# Patient Record
Sex: Female | Born: 1940 | Race: White | Hispanic: No | Smoking: Former smoker
Health system: Southern US, Community
[De-identification: ages and names within clinical notes are randomized; demographics above are authoritative.]

## PROBLEM LIST (undated history)

## (undated) DIAGNOSIS — M1990 Unspecified osteoarthritis, unspecified site: Secondary | ICD-10-CM

## (undated) DIAGNOSIS — F419 Anxiety disorder, unspecified: Secondary | ICD-10-CM

## (undated) DIAGNOSIS — Z950 Presence of cardiac pacemaker: Secondary | ICD-10-CM

## (undated) DIAGNOSIS — K219 Gastro-esophageal reflux disease without esophagitis: Secondary | ICD-10-CM

## (undated) DIAGNOSIS — E0500 Thyrotoxicosis with diffuse goiter without thyrotoxic crisis or storm: Secondary | ICD-10-CM

## (undated) DIAGNOSIS — I2510 Atherosclerotic heart disease of native coronary artery without angina pectoris: Secondary | ICD-10-CM

## (undated) DIAGNOSIS — I447 Left bundle-branch block, unspecified: Secondary | ICD-10-CM

## (undated) DIAGNOSIS — I10 Essential (primary) hypertension: Secondary | ICD-10-CM

## (undated) DIAGNOSIS — I442 Atrioventricular block, complete: Secondary | ICD-10-CM

## (undated) DIAGNOSIS — E785 Hyperlipidemia, unspecified: Secondary | ICD-10-CM

## (undated) HISTORY — DX: Essential (primary) hypertension: I10

## (undated) HISTORY — PX: OTHER SURGICAL HISTORY: SHX169

## (undated) HISTORY — DX: Hyperlipidemia, unspecified: E78.5

## (undated) HISTORY — DX: Thyrotoxicosis with diffuse goiter without thyrotoxic crisis or storm: E05.00

## (undated) HISTORY — PX: CARDIAC SURGERY: SHX584

## (undated) HISTORY — DX: Atherosclerotic heart disease of native coronary artery without angina pectoris: I25.10

## (undated) HISTORY — DX: Unspecified osteoarthritis, unspecified site: M19.90

## (undated) HISTORY — DX: Anxiety disorder, unspecified: F41.9

## (undated) HISTORY — DX: Gastro-esophageal reflux disease without esophagitis: K21.9

---

## 2013-03-03 DIAGNOSIS — J984 Other disorders of lung: Secondary | ICD-10-CM | POA: Diagnosis not present

## 2013-03-03 DIAGNOSIS — R9431 Abnormal electrocardiogram [ECG] [EKG]: Secondary | ICD-10-CM | POA: Diagnosis not present

## 2013-03-03 DIAGNOSIS — I214 Non-ST elevation (NSTEMI) myocardial infarction: Secondary | ICD-10-CM | POA: Diagnosis not present

## 2013-03-04 DIAGNOSIS — E7800 Pure hypercholesterolemia, unspecified: Secondary | ICD-10-CM | POA: Diagnosis not present

## 2013-03-04 DIAGNOSIS — R7989 Other specified abnormal findings of blood chemistry: Secondary | ICD-10-CM | POA: Diagnosis not present

## 2013-03-04 DIAGNOSIS — I509 Heart failure, unspecified: Secondary | ICD-10-CM | POA: Diagnosis not present

## 2013-03-04 DIAGNOSIS — I5032 Chronic diastolic (congestive) heart failure: Secondary | ICD-10-CM | POA: Diagnosis not present

## 2013-03-04 DIAGNOSIS — I214 Non-ST elevation (NSTEMI) myocardial infarction: Secondary | ICD-10-CM | POA: Diagnosis not present

## 2013-03-04 DIAGNOSIS — M810 Age-related osteoporosis without current pathological fracture: Secondary | ICD-10-CM | POA: Diagnosis present

## 2013-03-04 DIAGNOSIS — D72829 Elevated white blood cell count, unspecified: Secondary | ICD-10-CM | POA: Diagnosis present

## 2013-03-04 DIAGNOSIS — E785 Hyperlipidemia, unspecified: Secondary | ICD-10-CM | POA: Diagnosis present

## 2013-03-04 DIAGNOSIS — I10 Essential (primary) hypertension: Secondary | ICD-10-CM | POA: Diagnosis not present

## 2013-03-04 DIAGNOSIS — Z8262 Family history of osteoporosis: Secondary | ICD-10-CM | POA: Diagnosis not present

## 2013-03-04 DIAGNOSIS — Z8249 Family history of ischemic heart disease and other diseases of the circulatory system: Secondary | ICD-10-CM | POA: Diagnosis not present

## 2013-03-04 DIAGNOSIS — I209 Angina pectoris, unspecified: Secondary | ICD-10-CM | POA: Diagnosis not present

## 2013-03-04 DIAGNOSIS — Z880 Allergy status to penicillin: Secondary | ICD-10-CM | POA: Diagnosis not present

## 2013-03-04 DIAGNOSIS — R9431 Abnormal electrocardiogram [ECG] [EKG]: Secondary | ICD-10-CM | POA: Diagnosis not present

## 2013-03-04 DIAGNOSIS — J984 Other disorders of lung: Secondary | ICD-10-CM | POA: Diagnosis not present

## 2013-03-04 DIAGNOSIS — I447 Left bundle-branch block, unspecified: Secondary | ICD-10-CM | POA: Diagnosis not present

## 2013-03-04 DIAGNOSIS — Z83511 Family history of glaucoma: Secondary | ICD-10-CM | POA: Diagnosis not present

## 2013-03-04 DIAGNOSIS — K219 Gastro-esophageal reflux disease without esophagitis: Secondary | ICD-10-CM | POA: Diagnosis present

## 2013-03-04 DIAGNOSIS — R079 Chest pain, unspecified: Secondary | ICD-10-CM | POA: Diagnosis not present

## 2013-03-04 DIAGNOSIS — I059 Rheumatic mitral valve disease, unspecified: Secondary | ICD-10-CM | POA: Diagnosis present

## 2013-03-04 DIAGNOSIS — F411 Generalized anxiety disorder: Secondary | ICD-10-CM | POA: Diagnosis not present

## 2013-03-04 DIAGNOSIS — Z7982 Long term (current) use of aspirin: Secondary | ICD-10-CM | POA: Diagnosis not present

## 2013-03-04 DIAGNOSIS — R000 Tachycardia, unspecified: Secondary | ICD-10-CM | POA: Diagnosis not present

## 2013-03-04 DIAGNOSIS — I498 Other specified cardiac arrhythmias: Secondary | ICD-10-CM | POA: Diagnosis not present

## 2013-03-04 DIAGNOSIS — I2510 Atherosclerotic heart disease of native coronary artery without angina pectoris: Secondary | ICD-10-CM | POA: Diagnosis not present

## 2013-03-04 DIAGNOSIS — Z79899 Other long term (current) drug therapy: Secondary | ICD-10-CM | POA: Diagnosis not present

## 2013-03-04 DIAGNOSIS — Z87891 Personal history of nicotine dependence: Secondary | ICD-10-CM | POA: Diagnosis not present

## 2013-03-11 DIAGNOSIS — R5383 Other fatigue: Secondary | ICD-10-CM | POA: Diagnosis not present

## 2013-03-11 DIAGNOSIS — I214 Non-ST elevation (NSTEMI) myocardial infarction: Secondary | ICD-10-CM | POA: Diagnosis not present

## 2013-03-11 DIAGNOSIS — I10 Essential (primary) hypertension: Secondary | ICD-10-CM | POA: Diagnosis not present

## 2013-03-11 DIAGNOSIS — F411 Generalized anxiety disorder: Secondary | ICD-10-CM | POA: Diagnosis not present

## 2013-03-11 DIAGNOSIS — R5381 Other malaise: Secondary | ICD-10-CM | POA: Diagnosis not present

## 2013-03-12 DIAGNOSIS — I10 Essential (primary) hypertension: Secondary | ICD-10-CM | POA: Diagnosis not present

## 2013-03-12 DIAGNOSIS — I447 Left bundle-branch block, unspecified: Secondary | ICD-10-CM | POA: Diagnosis not present

## 2013-03-12 DIAGNOSIS — I059 Rheumatic mitral valve disease, unspecified: Secondary | ICD-10-CM | POA: Diagnosis not present

## 2013-03-12 DIAGNOSIS — I2510 Atherosclerotic heart disease of native coronary artery without angina pectoris: Secondary | ICD-10-CM | POA: Diagnosis not present

## 2013-03-18 DIAGNOSIS — I214 Non-ST elevation (NSTEMI) myocardial infarction: Secondary | ICD-10-CM | POA: Diagnosis not present

## 2013-03-18 DIAGNOSIS — IMO0001 Reserved for inherently not codable concepts without codable children: Secondary | ICD-10-CM | POA: Diagnosis not present

## 2013-03-20 DIAGNOSIS — R350 Frequency of micturition: Secondary | ICD-10-CM | POA: Diagnosis not present

## 2013-03-20 DIAGNOSIS — R300 Dysuria: Secondary | ICD-10-CM | POA: Diagnosis not present

## 2013-03-20 DIAGNOSIS — I214 Non-ST elevation (NSTEMI) myocardial infarction: Secondary | ICD-10-CM | POA: Diagnosis not present

## 2013-03-20 DIAGNOSIS — I10 Essential (primary) hypertension: Secondary | ICD-10-CM | POA: Diagnosis not present

## 2013-03-20 DIAGNOSIS — F411 Generalized anxiety disorder: Secondary | ICD-10-CM | POA: Diagnosis not present

## 2013-03-20 DIAGNOSIS — I2510 Atherosclerotic heart disease of native coronary artery without angina pectoris: Secondary | ICD-10-CM | POA: Diagnosis not present

## 2013-03-22 DIAGNOSIS — I214 Non-ST elevation (NSTEMI) myocardial infarction: Secondary | ICD-10-CM | POA: Diagnosis not present

## 2013-03-22 DIAGNOSIS — IMO0001 Reserved for inherently not codable concepts without codable children: Secondary | ICD-10-CM | POA: Diagnosis not present

## 2013-03-27 DIAGNOSIS — IMO0001 Reserved for inherently not codable concepts without codable children: Secondary | ICD-10-CM | POA: Diagnosis not present

## 2013-03-27 DIAGNOSIS — I214 Non-ST elevation (NSTEMI) myocardial infarction: Secondary | ICD-10-CM | POA: Diagnosis not present

## 2013-03-29 DIAGNOSIS — I214 Non-ST elevation (NSTEMI) myocardial infarction: Secondary | ICD-10-CM | POA: Diagnosis not present

## 2013-03-29 DIAGNOSIS — IMO0001 Reserved for inherently not codable concepts without codable children: Secondary | ICD-10-CM | POA: Diagnosis not present

## 2013-04-01 DIAGNOSIS — I214 Non-ST elevation (NSTEMI) myocardial infarction: Secondary | ICD-10-CM | POA: Diagnosis not present

## 2013-04-01 DIAGNOSIS — Z5189 Encounter for other specified aftercare: Secondary | ICD-10-CM | POA: Diagnosis not present

## 2013-04-02 DIAGNOSIS — R61 Generalized hyperhidrosis: Secondary | ICD-10-CM | POA: Diagnosis not present

## 2013-04-02 DIAGNOSIS — I447 Left bundle-branch block, unspecified: Secondary | ICD-10-CM | POA: Diagnosis not present

## 2013-04-02 DIAGNOSIS — R0602 Shortness of breath: Secondary | ICD-10-CM | POA: Diagnosis not present

## 2013-04-02 DIAGNOSIS — R5383 Other fatigue: Secondary | ICD-10-CM | POA: Diagnosis not present

## 2013-04-02 DIAGNOSIS — R9431 Abnormal electrocardiogram [ECG] [EKG]: Secondary | ICD-10-CM | POA: Diagnosis not present

## 2013-04-02 DIAGNOSIS — I2510 Atherosclerotic heart disease of native coronary artery without angina pectoris: Secondary | ICD-10-CM | POA: Diagnosis not present

## 2013-04-02 DIAGNOSIS — I10 Essential (primary) hypertension: Secondary | ICD-10-CM | POA: Diagnosis not present

## 2013-04-02 DIAGNOSIS — R5381 Other malaise: Secondary | ICD-10-CM | POA: Diagnosis not present

## 2013-04-02 DIAGNOSIS — M549 Dorsalgia, unspecified: Secondary | ICD-10-CM | POA: Diagnosis not present

## 2013-04-02 DIAGNOSIS — R079 Chest pain, unspecified: Secondary | ICD-10-CM | POA: Diagnosis not present

## 2013-04-02 DIAGNOSIS — Z87891 Personal history of nicotine dependence: Secondary | ICD-10-CM | POA: Diagnosis not present

## 2013-04-02 DIAGNOSIS — R1013 Epigastric pain: Secondary | ICD-10-CM | POA: Diagnosis not present

## 2013-04-02 DIAGNOSIS — R110 Nausea: Secondary | ICD-10-CM | POA: Diagnosis not present

## 2013-04-04 DIAGNOSIS — R110 Nausea: Secondary | ICD-10-CM | POA: Diagnosis not present

## 2013-04-04 DIAGNOSIS — F411 Generalized anxiety disorder: Secondary | ICD-10-CM | POA: Diagnosis not present

## 2013-04-04 DIAGNOSIS — K5900 Constipation, unspecified: Secondary | ICD-10-CM | POA: Diagnosis not present

## 2013-04-04 DIAGNOSIS — I10 Essential (primary) hypertension: Secondary | ICD-10-CM | POA: Diagnosis not present

## 2013-04-05 DIAGNOSIS — I214 Non-ST elevation (NSTEMI) myocardial infarction: Secondary | ICD-10-CM | POA: Diagnosis not present

## 2013-04-05 DIAGNOSIS — Z5189 Encounter for other specified aftercare: Secondary | ICD-10-CM | POA: Diagnosis not present

## 2013-04-10 DIAGNOSIS — I214 Non-ST elevation (NSTEMI) myocardial infarction: Secondary | ICD-10-CM | POA: Diagnosis not present

## 2013-04-10 DIAGNOSIS — I2510 Atherosclerotic heart disease of native coronary artery without angina pectoris: Secondary | ICD-10-CM | POA: Diagnosis not present

## 2013-04-10 DIAGNOSIS — Z5189 Encounter for other specified aftercare: Secondary | ICD-10-CM | POA: Diagnosis not present

## 2013-04-10 DIAGNOSIS — I447 Left bundle-branch block, unspecified: Secondary | ICD-10-CM | POA: Diagnosis not present

## 2013-04-10 DIAGNOSIS — E7800 Pure hypercholesterolemia, unspecified: Secondary | ICD-10-CM | POA: Diagnosis not present

## 2013-04-12 DIAGNOSIS — Z5189 Encounter for other specified aftercare: Secondary | ICD-10-CM | POA: Diagnosis not present

## 2013-04-12 DIAGNOSIS — I214 Non-ST elevation (NSTEMI) myocardial infarction: Secondary | ICD-10-CM | POA: Diagnosis not present

## 2013-04-26 DIAGNOSIS — I214 Non-ST elevation (NSTEMI) myocardial infarction: Secondary | ICD-10-CM | POA: Diagnosis not present

## 2013-04-26 DIAGNOSIS — Z5189 Encounter for other specified aftercare: Secondary | ICD-10-CM | POA: Diagnosis not present

## 2013-04-29 DIAGNOSIS — R5383 Other fatigue: Secondary | ICD-10-CM | POA: Diagnosis not present

## 2013-04-29 DIAGNOSIS — R110 Nausea: Secondary | ICD-10-CM | POA: Diagnosis not present

## 2013-04-29 DIAGNOSIS — E7800 Pure hypercholesterolemia, unspecified: Secondary | ICD-10-CM | POA: Diagnosis not present

## 2013-04-29 DIAGNOSIS — R918 Other nonspecific abnormal finding of lung field: Secondary | ICD-10-CM | POA: Diagnosis not present

## 2013-04-29 DIAGNOSIS — M810 Age-related osteoporosis without current pathological fracture: Secondary | ICD-10-CM | POA: Diagnosis not present

## 2013-04-29 DIAGNOSIS — R9431 Abnormal electrocardiogram [ECG] [EKG]: Secondary | ICD-10-CM | POA: Diagnosis not present

## 2013-04-29 DIAGNOSIS — Z79899 Other long term (current) drug therapy: Secondary | ICD-10-CM | POA: Diagnosis not present

## 2013-04-29 DIAGNOSIS — R0789 Other chest pain: Secondary | ICD-10-CM | POA: Diagnosis not present

## 2013-04-29 DIAGNOSIS — Z87891 Personal history of nicotine dependence: Secondary | ICD-10-CM | POA: Diagnosis not present

## 2013-04-29 DIAGNOSIS — R079 Chest pain, unspecified: Secondary | ICD-10-CM | POA: Diagnosis not present

## 2013-04-29 DIAGNOSIS — K219 Gastro-esophageal reflux disease without esophagitis: Secondary | ICD-10-CM | POA: Diagnosis not present

## 2013-04-29 DIAGNOSIS — I059 Rheumatic mitral valve disease, unspecified: Secondary | ICD-10-CM | POA: Diagnosis not present

## 2013-04-29 DIAGNOSIS — I252 Old myocardial infarction: Secondary | ICD-10-CM | POA: Diagnosis not present

## 2013-04-29 DIAGNOSIS — Z880 Allergy status to penicillin: Secondary | ICD-10-CM | POA: Diagnosis not present

## 2013-04-29 DIAGNOSIS — R5381 Other malaise: Secondary | ICD-10-CM | POA: Diagnosis not present

## 2013-04-29 DIAGNOSIS — I2510 Atherosclerotic heart disease of native coronary artery without angina pectoris: Secondary | ICD-10-CM | POA: Diagnosis not present

## 2013-04-29 DIAGNOSIS — I10 Essential (primary) hypertension: Secondary | ICD-10-CM | POA: Diagnosis not present

## 2013-04-29 DIAGNOSIS — F411 Generalized anxiety disorder: Secondary | ICD-10-CM | POA: Diagnosis not present

## 2013-04-30 DIAGNOSIS — F411 Generalized anxiety disorder: Secondary | ICD-10-CM | POA: Diagnosis not present

## 2013-04-30 DIAGNOSIS — R079 Chest pain, unspecified: Secondary | ICD-10-CM | POA: Diagnosis not present

## 2013-04-30 DIAGNOSIS — I10 Essential (primary) hypertension: Secondary | ICD-10-CM | POA: Diagnosis not present

## 2013-05-03 DIAGNOSIS — I214 Non-ST elevation (NSTEMI) myocardial infarction: Secondary | ICD-10-CM | POA: Diagnosis not present

## 2013-05-03 DIAGNOSIS — Z5189 Encounter for other specified aftercare: Secondary | ICD-10-CM | POA: Diagnosis not present

## 2013-05-06 DIAGNOSIS — I2510 Atherosclerotic heart disease of native coronary artery without angina pectoris: Secondary | ICD-10-CM | POA: Diagnosis not present

## 2013-05-27 DIAGNOSIS — I214 Non-ST elevation (NSTEMI) myocardial infarction: Secondary | ICD-10-CM | POA: Diagnosis not present

## 2013-05-27 DIAGNOSIS — I447 Left bundle-branch block, unspecified: Secondary | ICD-10-CM | POA: Diagnosis not present

## 2013-05-27 DIAGNOSIS — I2510 Atherosclerotic heart disease of native coronary artery without angina pectoris: Secondary | ICD-10-CM | POA: Diagnosis not present

## 2013-05-27 DIAGNOSIS — E7800 Pure hypercholesterolemia, unspecified: Secondary | ICD-10-CM | POA: Diagnosis not present

## 2013-07-11 DIAGNOSIS — D649 Anemia, unspecified: Secondary | ICD-10-CM | POA: Diagnosis not present

## 2013-07-11 DIAGNOSIS — E782 Mixed hyperlipidemia: Secondary | ICD-10-CM | POA: Diagnosis not present

## 2013-07-11 DIAGNOSIS — I2510 Atherosclerotic heart disease of native coronary artery without angina pectoris: Secondary | ICD-10-CM | POA: Diagnosis not present

## 2013-07-11 DIAGNOSIS — M810 Age-related osteoporosis without current pathological fracture: Secondary | ICD-10-CM | POA: Diagnosis not present

## 2013-08-14 DIAGNOSIS — I2510 Atherosclerotic heart disease of native coronary artery without angina pectoris: Secondary | ICD-10-CM | POA: Diagnosis not present

## 2013-08-14 DIAGNOSIS — I10 Essential (primary) hypertension: Secondary | ICD-10-CM | POA: Diagnosis not present

## 2013-08-14 DIAGNOSIS — Z13220 Encounter for screening for lipoid disorders: Secondary | ICD-10-CM | POA: Diagnosis not present

## 2013-08-14 DIAGNOSIS — N390 Urinary tract infection, site not specified: Secondary | ICD-10-CM | POA: Diagnosis not present

## 2013-08-14 DIAGNOSIS — M810 Age-related osteoporosis without current pathological fracture: Secondary | ICD-10-CM | POA: Diagnosis not present

## 2013-08-14 DIAGNOSIS — E785 Hyperlipidemia, unspecified: Secondary | ICD-10-CM | POA: Diagnosis not present

## 2013-08-22 DIAGNOSIS — F411 Generalized anxiety disorder: Secondary | ICD-10-CM | POA: Diagnosis not present

## 2013-08-22 DIAGNOSIS — R079 Chest pain, unspecified: Secondary | ICD-10-CM | POA: Diagnosis not present

## 2013-08-22 DIAGNOSIS — Z87891 Personal history of nicotine dependence: Secondary | ICD-10-CM | POA: Diagnosis not present

## 2013-08-22 DIAGNOSIS — R1013 Epigastric pain: Secondary | ICD-10-CM | POA: Diagnosis not present

## 2013-08-22 DIAGNOSIS — R110 Nausea: Secondary | ICD-10-CM | POA: Diagnosis not present

## 2013-08-27 DIAGNOSIS — F411 Generalized anxiety disorder: Secondary | ICD-10-CM | POA: Diagnosis not present

## 2013-08-27 DIAGNOSIS — K219 Gastro-esophageal reflux disease without esophagitis: Secondary | ICD-10-CM | POA: Diagnosis not present

## 2013-08-28 DIAGNOSIS — E7800 Pure hypercholesterolemia, unspecified: Secondary | ICD-10-CM | POA: Diagnosis not present

## 2013-08-28 DIAGNOSIS — I2510 Atherosclerotic heart disease of native coronary artery without angina pectoris: Secondary | ICD-10-CM | POA: Diagnosis not present

## 2013-08-28 DIAGNOSIS — K219 Gastro-esophageal reflux disease without esophagitis: Secondary | ICD-10-CM | POA: Diagnosis not present

## 2013-08-28 DIAGNOSIS — I447 Left bundle-branch block, unspecified: Secondary | ICD-10-CM | POA: Diagnosis not present

## 2013-10-10 DIAGNOSIS — F330 Major depressive disorder, recurrent, mild: Secondary | ICD-10-CM | POA: Diagnosis not present

## 2013-10-10 DIAGNOSIS — F411 Generalized anxiety disorder: Secondary | ICD-10-CM | POA: Diagnosis not present

## 2014-02-05 DIAGNOSIS — K219 Gastro-esophageal reflux disease without esophagitis: Secondary | ICD-10-CM | POA: Diagnosis not present

## 2014-02-05 DIAGNOSIS — I447 Left bundle-branch block, unspecified: Secondary | ICD-10-CM | POA: Diagnosis not present

## 2014-02-05 DIAGNOSIS — I10 Essential (primary) hypertension: Secondary | ICD-10-CM | POA: Diagnosis not present

## 2014-02-05 DIAGNOSIS — E780 Pure hypercholesterolemia: Secondary | ICD-10-CM | POA: Diagnosis not present

## 2014-02-05 DIAGNOSIS — I2510 Atherosclerotic heart disease of native coronary artery without angina pectoris: Secondary | ICD-10-CM | POA: Diagnosis not present

## 2014-03-20 DIAGNOSIS — F4321 Adjustment disorder with depressed mood: Secondary | ICD-10-CM | POA: Diagnosis not present

## 2014-03-20 DIAGNOSIS — F419 Anxiety disorder, unspecified: Secondary | ICD-10-CM | POA: Diagnosis not present

## 2014-03-24 DIAGNOSIS — E780 Pure hypercholesterolemia: Secondary | ICD-10-CM | POA: Diagnosis not present

## 2014-03-24 DIAGNOSIS — I2510 Atherosclerotic heart disease of native coronary artery without angina pectoris: Secondary | ICD-10-CM | POA: Diagnosis not present

## 2014-03-24 DIAGNOSIS — I447 Left bundle-branch block, unspecified: Secondary | ICD-10-CM | POA: Diagnosis not present

## 2014-03-24 DIAGNOSIS — I10 Essential (primary) hypertension: Secondary | ICD-10-CM | POA: Diagnosis not present

## 2014-03-27 DIAGNOSIS — I10 Essential (primary) hypertension: Secondary | ICD-10-CM | POA: Diagnosis not present

## 2014-03-27 DIAGNOSIS — Z0000 Encounter for general adult medical examination without abnormal findings: Secondary | ICD-10-CM | POA: Diagnosis not present

## 2014-03-27 DIAGNOSIS — F419 Anxiety disorder, unspecified: Secondary | ICD-10-CM | POA: Diagnosis not present

## 2014-03-27 DIAGNOSIS — I2510 Atherosclerotic heart disease of native coronary artery without angina pectoris: Secondary | ICD-10-CM | POA: Diagnosis not present

## 2014-03-27 DIAGNOSIS — F329 Major depressive disorder, single episode, unspecified: Secondary | ICD-10-CM | POA: Diagnosis not present

## 2014-03-27 DIAGNOSIS — E785 Hyperlipidemia, unspecified: Secondary | ICD-10-CM | POA: Diagnosis not present

## 2014-04-10 DIAGNOSIS — I2510 Atherosclerotic heart disease of native coronary artery without angina pectoris: Secondary | ICD-10-CM | POA: Diagnosis not present

## 2014-04-10 DIAGNOSIS — F411 Generalized anxiety disorder: Secondary | ICD-10-CM | POA: Diagnosis not present

## 2014-04-18 DIAGNOSIS — F330 Major depressive disorder, recurrent, mild: Secondary | ICD-10-CM | POA: Diagnosis not present

## 2014-04-18 DIAGNOSIS — F411 Generalized anxiety disorder: Secondary | ICD-10-CM | POA: Diagnosis not present

## 2014-04-25 DIAGNOSIS — F330 Major depressive disorder, recurrent, mild: Secondary | ICD-10-CM | POA: Diagnosis not present

## 2014-04-25 DIAGNOSIS — F411 Generalized anxiety disorder: Secondary | ICD-10-CM | POA: Diagnosis not present

## 2014-05-01 DIAGNOSIS — I2510 Atherosclerotic heart disease of native coronary artery without angina pectoris: Secondary | ICD-10-CM | POA: Diagnosis not present

## 2014-05-01 DIAGNOSIS — R1033 Periumbilical pain: Secondary | ICD-10-CM | POA: Diagnosis not present

## 2014-05-01 DIAGNOSIS — F419 Anxiety disorder, unspecified: Secondary | ICD-10-CM | POA: Diagnosis not present

## 2014-05-09 DIAGNOSIS — F411 Generalized anxiety disorder: Secondary | ICD-10-CM | POA: Diagnosis not present

## 2014-05-09 DIAGNOSIS — F330 Major depressive disorder, recurrent, mild: Secondary | ICD-10-CM | POA: Diagnosis not present

## 2014-05-21 DIAGNOSIS — F411 Generalized anxiety disorder: Secondary | ICD-10-CM | POA: Diagnosis not present

## 2014-05-21 DIAGNOSIS — F330 Major depressive disorder, recurrent, mild: Secondary | ICD-10-CM | POA: Diagnosis not present

## 2014-06-05 DIAGNOSIS — F330 Major depressive disorder, recurrent, mild: Secondary | ICD-10-CM | POA: Diagnosis not present

## 2014-06-05 DIAGNOSIS — F411 Generalized anxiety disorder: Secondary | ICD-10-CM | POA: Diagnosis not present

## 2014-06-12 DIAGNOSIS — I2510 Atherosclerotic heart disease of native coronary artery without angina pectoris: Secondary | ICD-10-CM | POA: Diagnosis not present

## 2014-06-12 DIAGNOSIS — F418 Other specified anxiety disorders: Secondary | ICD-10-CM | POA: Diagnosis not present

## 2014-06-23 DIAGNOSIS — I2510 Atherosclerotic heart disease of native coronary artery without angina pectoris: Secondary | ICD-10-CM | POA: Diagnosis not present

## 2014-06-23 DIAGNOSIS — E780 Pure hypercholesterolemia: Secondary | ICD-10-CM | POA: Diagnosis not present

## 2014-06-23 DIAGNOSIS — I471 Supraventricular tachycardia: Secondary | ICD-10-CM | POA: Diagnosis not present

## 2014-06-23 DIAGNOSIS — F411 Generalized anxiety disorder: Secondary | ICD-10-CM | POA: Diagnosis not present

## 2014-06-23 DIAGNOSIS — I10 Essential (primary) hypertension: Secondary | ICD-10-CM | POA: Diagnosis not present

## 2014-06-23 DIAGNOSIS — I447 Left bundle-branch block, unspecified: Secondary | ICD-10-CM | POA: Diagnosis not present

## 2014-06-25 DIAGNOSIS — F411 Generalized anxiety disorder: Secondary | ICD-10-CM | POA: Diagnosis not present

## 2014-06-25 DIAGNOSIS — F330 Major depressive disorder, recurrent, mild: Secondary | ICD-10-CM | POA: Diagnosis not present

## 2014-08-08 DIAGNOSIS — F54 Psychological and behavioral factors associated with disorders or diseases classified elsewhere: Secondary | ICD-10-CM | POA: Diagnosis not present

## 2014-08-08 DIAGNOSIS — F411 Generalized anxiety disorder: Secondary | ICD-10-CM | POA: Diagnosis not present

## 2014-12-15 ENCOUNTER — Encounter (INDEPENDENT_AMBULATORY_CARE_PROVIDER_SITE_OTHER): Payer: Self-pay

## 2014-12-15 ENCOUNTER — Encounter: Payer: Self-pay | Admitting: Family Medicine

## 2014-12-15 ENCOUNTER — Ambulatory Visit (INDEPENDENT_AMBULATORY_CARE_PROVIDER_SITE_OTHER): Payer: Medicare Other | Admitting: Family Medicine

## 2014-12-15 VITALS — BP 170/92 | HR 70 | Temp 98.2°F | Ht 64.25 in | Wt 118.8 lb

## 2014-12-15 DIAGNOSIS — I2510 Atherosclerotic heart disease of native coronary artery without angina pectoris: Secondary | ICD-10-CM | POA: Diagnosis not present

## 2014-12-15 DIAGNOSIS — E559 Vitamin D deficiency, unspecified: Secondary | ICD-10-CM | POA: Insufficient documentation

## 2014-12-15 DIAGNOSIS — F329 Major depressive disorder, single episode, unspecified: Secondary | ICD-10-CM | POA: Diagnosis not present

## 2014-12-15 DIAGNOSIS — R5383 Other fatigue: Secondary | ICD-10-CM

## 2014-12-15 DIAGNOSIS — R8290 Unspecified abnormal findings in urine: Secondary | ICD-10-CM | POA: Diagnosis not present

## 2014-12-15 DIAGNOSIS — M810 Age-related osteoporosis without current pathological fracture: Secondary | ICD-10-CM

## 2014-12-15 DIAGNOSIS — F411 Generalized anxiety disorder: Secondary | ICD-10-CM | POA: Insufficient documentation

## 2014-12-15 DIAGNOSIS — Z1239 Encounter for other screening for malignant neoplasm of breast: Secondary | ICD-10-CM

## 2014-12-15 DIAGNOSIS — L409 Psoriasis, unspecified: Secondary | ICD-10-CM | POA: Insufficient documentation

## 2014-12-15 DIAGNOSIS — E079 Disorder of thyroid, unspecified: Secondary | ICD-10-CM | POA: Diagnosis not present

## 2014-12-15 DIAGNOSIS — R319 Hematuria, unspecified: Secondary | ICD-10-CM | POA: Diagnosis not present

## 2014-12-15 DIAGNOSIS — I25810 Atherosclerosis of coronary artery bypass graft(s) without angina pectoris: Secondary | ICD-10-CM | POA: Diagnosis not present

## 2014-12-15 DIAGNOSIS — Z9861 Coronary angioplasty status: Secondary | ICD-10-CM

## 2014-12-15 DIAGNOSIS — I10 Essential (primary) hypertension: Secondary | ICD-10-CM | POA: Insufficient documentation

## 2014-12-15 DIAGNOSIS — Z1231 Encounter for screening mammogram for malignant neoplasm of breast: Secondary | ICD-10-CM

## 2014-12-15 DIAGNOSIS — I447 Left bundle-branch block, unspecified: Secondary | ICD-10-CM | POA: Insufficient documentation

## 2014-12-15 DIAGNOSIS — R42 Dizziness and giddiness: Secondary | ICD-10-CM

## 2014-12-15 DIAGNOSIS — E785 Hyperlipidemia, unspecified: Secondary | ICD-10-CM

## 2014-12-15 DIAGNOSIS — R8299 Other abnormal findings in urine: Secondary | ICD-10-CM | POA: Diagnosis not present

## 2014-12-15 DIAGNOSIS — F32A Depression, unspecified: Secondary | ICD-10-CM

## 2014-12-15 LAB — POCT URINALYSIS DIPSTICK
Bilirubin, UA: NEGATIVE
GLUCOSE UA: NEGATIVE
Ketones, UA: NEGATIVE
LEUKOCYTES UA: NEGATIVE
NITRITE UA: NEGATIVE
Protein, UA: NEGATIVE
Spec Grav, UA: 1.015
UROBILINOGEN UA: 0.2
pH, UA: 6.5

## 2014-12-15 MED ORDER — METOPROLOL TARTRATE 25 MG PO TABS: 25.0000 mg | ORAL_TABLET | Freq: Two times a day (BID) | ORAL | Status: DC

## 2014-12-15 NOTE — Patient Instructions (Signed)
Coronary Artery Disease, Female  Coronary artery disease (CAD) is a process in which the blood vessels of the heart (coronary arteries) become narrow or blocked. The narrowing or blockage can lead to decreased blood flow to the heart muscle (angina). Symptoms known as angina can develop if the blood flow is reduced to the heart for a short period of time. Prolonged reduced blood flow can cause a heart attack (myocardial infarction, MI).  CAD is a leading cause of death for women. More women die from CAD than from cancer, lung disease, and accidents combined. It is important for women to understand the risks, symptoms, and treatment options for CAD.  CAUSES  Atherosclerosis is the cause of CAD. Atherosclerosis is the buildup of fat and cholesterol (plaque) on the inside of the arteries. Over time, the plaque may narrow or block the artery, and this will lessen blood flow to the heart. Plaque can also become weak and break off within a coronary artery to form a clot and cause a sudden blockage.  RISK FACTORS  Many risk factors increase your chances of getting CAD, including:  · High cholesterol levels.  · High blood pressure (hypertension).  · Tobacco use.  · Diabetes.  · Age. Women over age 55 are at a greater risk of CAD.  · Menopause.    All postmenopausal women are at greater risk of CAD.    Women who have experienced menopause between the ages of 40-45 (early menopause) are at a higher risk of CAD.    Women who have experienced menopause before age 40 (premature menopause) are at an extremely high risk of CAD.  · Family history of CAD.  · Obesity.  · Lack of exercise.  · A diet high in saturated fats.  SYMPTOMS   Many people do not experience any symptoms during the early stages of CAD. As the condition progresses, symptoms may include:  · Chest pain.    The pain can be described as crushing or squeezing, or a tightness, pressure, fullness, or heaviness in the chest.    The pain can last more than a few minutes  or can stop and recur.  · Pain in the arms, neck, jaw, or back.  · Unexplained heartburn or indigestion.  · Shortness of breath.  · Nausea.  · Sudden cold sweats.  · Sudden light-headedness.  Many women have chest discomfort and the other symptoms. However, women often have different (atypical) symptoms, such as:  · Fatigue.  · Unexplained feelings of nervousness or anxiety.  · Unexplained weakness.  · Dizziness or fainting.  Sometimes, women may not have any symptoms of CAD.  DIAGNOSIS   Tests to diagnose CAD may include:  · ECG (electrocardiogram).  · Exercise stress test. This looks for signs of blockage when the heart is being exercised.  · Pharmacologic stress test. This test looks for signs of blockage when the heart is being stressed with a medicine.  · Blood tests.  · Coronary angiogram. This is a procedure to look at the coronary arteries to see if there is any blockage.  TREATMENT  The treatment of CAD may include the following:  · Healthy behavioral changes to reduce or control risk factors.  · Medicine.  · Coronary stenting. A stent helps to keep an artery open.  · Coronary angioplasty. This procedure widens a narrowed or blocked artery.  · Coronary artery bypass surgery. This will allow your blood to pass the blockage (bypass) to reach your heart.  HOME CARE INSTRUCTIONS  ·   Take medicines only as directed by your health care provider.  · Do not take the following medicines unless your health care provider approves:    Nonsteroidal anti-inflammatory drugs (NSAIDs), such as ibuprofen, naproxen, or celecoxib.    Vitamin supplements that contain vitamin A, vitamin E, or both.    Hormone replacement therapy that contains estrogen with or without progestin.  · Manage other health conditions such as hypertension and diabetes as directed by your health care provider.  · Follow a heart-healthy diet. A dietitian can help to educate you about healthy food options and changes.  · Use healthy cooking methods such as  roasting, grilling, broiling, baking, poaching, steaming, or stir-frying. Talk to a dietitian to learn more about healthy cooking methods.  · Follow an exercise program approved by your health care provider.  · Maintain a healthy weight. Lose weight as approved by your health care provider.  · Plan rest periods when fatigued.  · Learn to manage stress.  · Do not use any tobacco products, including cigarettes, chewing tobacco, or electronic cigarettes. If you need help quitting, ask your health care provider.  · If you drink alcohol, and your health care provider approves, limit your alcohol intake to no more than 1 drink per day. One drink equals 12 ounces of beer, 5 ounces of wine, or 1½ ounces of hard liquor.  · Stop illegal drug use.  · Your health care provider may ask you to monitor your blood pressure. A blood pressure reading consists of a higher number over a lower number, such as 110 over 72, which is written as 110/72. Ideally, your blood pressure should be:    Below 140/90 if you have no other medical conditions.    Below 130/80 if you have diabetes or kidney disease.  · Keep all follow-up visits as directed by your health care provider. This is important.  SEEK IMMEDIATE MEDICAL CARE IF:  · You have pain in your chest, neck, arm, jaw, stomach, or back that lasts more than a few minutes, is recurring, or is unrelieved by taking medicine under your tongue (sublingual nitroglycerin).  · You have profuse sweating without cause.  · You have unexplained:    Heartburn or indigestion.    Shortness of breath or difficulty breathing.    Nausea or vomiting.    Fatigue.    Feelings of nervousness or anxiety.    Weakness.    Diarrhea.  · You have sudden light-headedness or dizziness.  · You faint.  These symptoms may represent a serious problem that is an emergency. Do not wait to see if the symptoms will go away. Get medical help right away. Call your local emergency services (911 in the [place] Do not drive yourself  to the hospital.     This information is not intended to replace advice given to you by your health care provider. Make sure you discuss any questions you have with your health care provider.     Document Released: 05/09/2011 Document Revised: 03/07/2014 Document Reviewed: 06/18/2013  Elsevier Interactive Patient Education ©2016 [employer]vier Inc.

## 2014-12-15 NOTE — Progress Notes (Signed)
Pre visit review using our clinic review tool, if applicable. No additional management support is needed unless otherwise documented below in the visit note. 

## 2014-12-15 NOTE — Progress Notes (Signed)
Patient ID: Nicole Watkins, female    DOB: Aug 08, 1940  Age: 74 y.o. MRN: [id number]    Subjective:  Subjective HPI Nicole Watkins presents here to establish.   She is due for her 6 months check and labs.  She has been tired and sad since her Husband passed away.  She also needs a cardiologist.    Review of Systems  Constitutional: Positive for fatigue. Negative for diaphoresis, appetite change and unexpected weight change.  Eyes: Negative for pain, redness and visual disturbance.  Respiratory: Negative for cough, chest tightness, shortness of breath and wheezing.   Cardiovascular: Negative for chest pain, palpitations and leg swelling.  Endocrine: Negative for cold intolerance, heat intolerance, polydipsia, polyphagia and polyuria.  Genitourinary: Negative for dysuria, frequency and difficulty urinating.  Skin: Positive for color change. Negative for wound.  Neurological: Negative for dizziness, light-headedness, numbness and headaches.  Psychiatric/Behavioral: Positive for sleep disturbance, dysphoric mood and decreased concentration. Negative for suicidal ideas. The patient is nervous/anxious.     History Past Medical History  Diagnosis Date  . Arthritis   . Hyperlipidemia   . Hypertension   . Heart attack ([place])   . Anxiety   . GERD (gastroesophageal reflux disease)   . Thyroid-related proptosis     She has past surgical history that includes Carotid stent insertion.   Her family history includes Arthritis in her mother; Hyperlipidemia in her mother; Hypertension in her mother; Hypothyroidism in her mother; Uterine cancer in her maternal grandmother.She reports that she has quit smoking. She has never used smokeless tobacco. Her alcohol and drug histories are not on file.  No current outpatient prescriptions on file prior to visit.   No current facility-administered medications on file prior to visit.     Objective:  Objective Physical Exam  Constitutional: She is  oriented to person, place, and time. She appears well-developed and well-nourished.  HENT:  Head: Normocephalic and atraumatic.  Eyes: Conjunctivae and EOM are normal.  Neck: Normal range of motion. Neck supple. No JVD present. Carotid bruit is not present. No thyromegaly present.  Cardiovascular: Normal rate, regular rhythm and normal heart sounds.   No murmur heard. Pulmonary/Chest: Effort normal and breath sounds normal. No respiratory distress. She has no wheezes. She has no rales. She exhibits no tenderness.  Musculoskeletal: She exhibits no edema.  Neurological: She is alert and oriented to person, place, and time.  Skin: Rash noted.     Psychiatric: Her behavior is normal. Her mood appears anxious. Her affect is not angry. She does not exhibit a depressed mood.   BP 170/92 mmHg  Pulse 70  Temp(Src) 98.2 F (36.8 C) (Oral)  Ht 5' 4.25" (1.632 m)  Wt 118 lb 12.8 oz (53.887 kg)  BMI 20.23 kg/m2  SpO2 97% Wt Readings from Last 3 Encounters:  12/15/14 118 lb 12.8 oz (53.887 kg)     No results found for: WBC, HGB, HCT, PLT, GLUCOSE, CHOL, TRIG, HDL, LDLDIRECT, LDLCALC, ALT, AST, NA, K, CL, CREATININE, BUN, CO2, TSH, PSA, INR, GLUF, HGBA1C, MICROALBUR  Patient was never admitted.   Assessment & Plan:  Plan I have changed Ms. Yurchak's metoprolol tartrate. I am also having her maintain her amLODipine, lisinopril, pravastatin, BRILINTA, nitroGLYCERIN, Cholecalciferol, cyanocobalamin, aspirin, docusate sodium, famotidine, and ALPRAZolam.  Meds ordered this encounter  Medications  . amLODipine (NORVASC) 5 MG tablet    Sig: Take 1 tablet by mouth 2 (two) times daily.  Marland Kitchen lisinopril (PRINIVIL,ZESTRIL) 20 MG tablet    Sig: Take 1  tablet by mouth 2 (two) times daily.  Marland Kitchen DISCONTD: metoprolol tartrate (LOPRESSOR) 25 MG tablet    Sig: Take 1 tablet by mouth 2 (two) times daily.  . pravastatin (PRAVACHOL) 40 MG tablet    Sig: Take 1 tablet by mouth daily.  Marland Kitchen BRILINTA 60 MG TABS tablet      Sig: Take 1 tablet by mouth 2 (two) times daily.  . nitroGLYCERIN (NITROSTAT) 0.4 MG SL tablet    Sig: Place 0.4 mg under the tongue every 5 (five) minutes as needed for chest pain.  . Cholecalciferol 4000 UNITS CAPS    Sig: Take 1 capsule by mouth daily.  . cyanocobalamin 2000 MCG tablet    Sig: Take 2,000 mcg by mouth daily.  Marland Kitchen aspirin 81 MG tablet    Sig: Take 81 mg by mouth daily.  Marland Kitchen docusate sodium (COLACE) 100 MG capsule    Sig: Take 100 mg by mouth 2 (two) times daily.  . famotidine (PEPCID) 20 MG tablet    Sig: Take 20 mg by mouth daily.  Marland Kitchen ALPRAZolam (XANAX) 0.25 MG tablet    Sig: Take 0.25 mg by mouth at bedtime as needed for anxiety.  . metoprolol tartrate (LOPRESSOR) 25 MG tablet    Sig: Take 1 tablet (25 mg total) by mouth 2 (two) times daily.    Dispense:  60 tablet    Refill:  2    Problem List Items Addressed This Visit    Vitamin D deficiency   Relevant Orders   Vitamin D 1,25 dihydroxy   Psoriasis   LBBB (left bundle branch block)   Relevant Medications   amLODipine (NORVASC) 5 MG tablet   lisinopril (PRINIVIL,ZESTRIL) 20 MG tablet   pravastatin (PRAVACHOL) 40 MG tablet   nitroGLYCERIN (NITROSTAT) 0.4 MG SL tablet   aspirin 81 MG tablet   metoprolol tartrate (LOPRESSOR) 25 MG tablet   Hyperlipidemia LDL goal <100   Relevant Medications   amLODipine (NORVASC) 5 MG tablet   lisinopril (PRINIVIL,ZESTRIL) 20 MG tablet   pravastatin (PRAVACHOL) 40 MG tablet   nitroGLYCERIN (NITROSTAT) 0.4 MG SL tablet   aspirin 81 MG tablet   metoprolol tartrate (LOPRESSOR) 25 MG tablet   HTN (hypertension)   Relevant Medications   amLODipine (NORVASC) 5 MG tablet   lisinopril (PRINIVIL,ZESTRIL) 20 MG tablet   pravastatin (PRAVACHOL) 40 MG tablet   nitroGLYCERIN (NITROSTAT) 0.4 MG SL tablet   aspirin 81 MG tablet   metoprolol tartrate (LOPRESSOR) 25 MG tablet   Generalized anxiety disorder   CAD (coronary artery disease) - Primary   Relevant Medications    amLODipine (NORVASC) 5 MG tablet   lisinopril (PRINIVIL,ZESTRIL) 20 MG tablet   pravastatin (PRAVACHOL) 40 MG tablet   nitroGLYCERIN (NITROSTAT) 0.4 MG SL tablet   aspirin 81 MG tablet   metoprolol tartrate (LOPRESSOR) 25 MG tablet   Other Relevant Orders   Lipid panel   POCT urinalysis dipstick (Completed)   POCT urinalysis dipstick (Completed)    Other Visit Diagnoses    Fatigue due to depression        Relevant Orders    Comp Met (CMET)    CBC with Differential/Platelet    POCT urinalysis dipstick (Completed)    TSH    Vitamin B12    T3, free    T4, free    Thyroid antibodies    Dizzy        Relevant Orders    Vitamin B12    Thyroid disease  Relevant Medications    metoprolol tartrate (LOPRESSOR) 25 MG tablet    Other Relevant Orders    T3, free    T4, free    Thyroid antibodies    Osteoporosis        Relevant Medications    Cholecalciferol 4000 UNITS CAPS    Other Relevant Orders    DG Bone Density    Breast cancer screening        Relevant Orders    MM DIGITAL SCREENING BILATERAL    Encounter for screening mammogram for breast cancer        Relevant Orders    DG Bone Density    Urine abnormality        Relevant Orders    Urine Culture    Hematuria        Relevant Orders    Urine Culture       Follow-up: Return in about 6 months (around 06/15/2015), or if symptoms worsen or fail to improve, for annual exam, fasting.  Garnet Koyanagi, DO

## 2014-12-16 LAB — T3, FREE: T3 FREE: 3.5 pg/mL (ref 2.3–4.2)

## 2014-12-16 LAB — LIPID PANEL
CHOLESTEROL: 206 mg/dL — AB (ref 0–200)
HDL: 67 mg/dL (ref >39.00)
LDL Cholesterol: 118 mg/dL — ABNORMAL HIGH (ref 0–99)
NonHDL: 139.21
TRIGLYCERIDES: 104 mg/dL (ref 0.0–149.0)
Total CHOL/HDL Ratio: 3
VLDL: 20.8 mg/dL (ref 0.0–40.0)

## 2014-12-16 LAB — CBC WITH DIFFERENTIAL/PLATELET
BASOS PCT: 0.8 % (ref 0.0–3.0)
Basophils Absolute: 0.1 10*3/uL (ref 0.0–0.1)
EOS ABS: 0.1 10*3/uL (ref 0.0–0.7)
Eosinophils Relative: 1.3 % (ref 0.0–5.0)
HCT: 42.2 % (ref 36.0–46.0)
Hemoglobin: 13.9 g/dL (ref 12.0–15.0)
LYMPHS PCT: 12.8 % (ref 12.0–46.0)
Lymphs Abs: 0.9 10*3/uL (ref 0.7–4.0)
MCHC: 32.9 g/dL (ref 30.0–36.0)
MCV: 88.6 fl (ref 78.0–100.0)
MONO ABS: 0.6 10*3/uL (ref 0.1–1.0)
Monocytes Relative: 9.2 % (ref 3.0–12.0)
NEUTROS ABS: 5.2 10*3/uL (ref 1.4–7.7)
NEUTROS PCT: 75.9 % (ref 43.0–77.0)
PLATELETS: 305 10*3/uL (ref 150.0–400.0)
RBC: 4.76 Mil/uL (ref 3.87–5.11)
RDW: 14.9 % (ref 11.5–15.5)
WBC: 6.9 10*3/uL (ref 4.0–10.5)

## 2014-12-16 LAB — COMPREHENSIVE METABOLIC PANEL
ALBUMIN: 4.5 g/dL (ref 3.5–5.2)
ALT: 14 U/L (ref 0–35)
AST: 21 U/L (ref 0–37)
Alkaline Phosphatase: 56 U/L (ref 39–117)
BILIRUBIN TOTAL: 0.5 mg/dL (ref 0.2–1.2)
BUN: 24 mg/dL — ABNORMAL HIGH (ref 6–23)
CALCIUM: 10 mg/dL (ref 8.4–10.5)
CHLORIDE: 102 meq/L (ref 96–112)
CO2: 31 meq/L (ref 19–32)
CREATININE: 0.88 mg/dL (ref 0.40–1.20)
GFR: 66.66 mL/min (ref >60.00)
Glucose, Bld: 97 mg/dL (ref 70–99)
Potassium: 3.9 mEq/L (ref 3.5–5.1)
Sodium: 140 mEq/L (ref 135–145)
Total Protein: 7.8 g/dL (ref 6.0–8.3)

## 2014-12-16 LAB — THYROID ANTIBODIES
Thyroglobulin Ab: 1 IU/mL (ref <2)
Thyroperoxidase Ab SerPl-aCnc: 2 IU/mL (ref <9)

## 2014-12-16 LAB — VITAMIN B12: VITAMIN B 12: 1070 pg/mL — AB (ref 211–911)

## 2014-12-16 LAB — TSH: TSH: 2.08 u[IU]/mL (ref 0.35–4.50)

## 2014-12-16 LAB — T4, FREE: Free T4: 0.87 ng/dL (ref 0.60–1.60)

## 2014-12-18 LAB — URINE CULTURE: Colony Count: >=100000

## 2014-12-19 LAB — VITAMIN D 1,25 DIHYDROXY
VITAMIN D 1, 25 (OH) TOTAL: 60 pg/mL (ref 18–72)
VITAMIN D3 1, 25 (OH): 60 pg/mL

## 2014-12-20 ENCOUNTER — Other Ambulatory Visit: Payer: Self-pay | Admitting: Family Medicine

## 2014-12-20 DIAGNOSIS — N390 Urinary tract infection, site not specified: Secondary | ICD-10-CM

## 2014-12-20 MED ORDER — NITROFURANTOIN MONOHYD MACRO 100 MG PO CAPS: 100.0000 mg | ORAL_CAPSULE | Freq: Two times a day (BID) | ORAL | Status: DC

## 2014-12-22 ENCOUNTER — Other Ambulatory Visit: Payer: Self-pay | Admitting: Family Medicine

## 2014-12-22 ENCOUNTER — Ambulatory Visit (HOSPITAL_BASED_OUTPATIENT_CLINIC_OR_DEPARTMENT_OTHER)
Admission: RE | Admit: 2014-12-22 | Discharge: 2014-12-22 | Disposition: A | Discharge status: Home / Self Care | Payer: Medicare Other | Source: Ambulatory Visit | Attending: Family Medicine | Admitting: Family Medicine

## 2014-12-22 DIAGNOSIS — Z780 Asymptomatic menopausal state: Secondary | ICD-10-CM | POA: Diagnosis not present

## 2014-12-22 DIAGNOSIS — Z1231 Encounter for screening mammogram for malignant neoplasm of breast: Secondary | ICD-10-CM | POA: Diagnosis not present

## 2014-12-22 DIAGNOSIS — M810 Age-related osteoporosis without current pathological fracture: Secondary | ICD-10-CM | POA: Diagnosis not present

## 2014-12-22 DIAGNOSIS — Z87891 Personal history of nicotine dependence: Secondary | ICD-10-CM | POA: Diagnosis not present

## 2014-12-22 DIAGNOSIS — Z8262 Family history of osteoporosis: Secondary | ICD-10-CM | POA: Insufficient documentation

## 2014-12-22 DIAGNOSIS — Z1239 Encounter for other screening for malignant neoplasm of breast: Secondary | ICD-10-CM

## 2014-12-22 MED ORDER — ALPRAZOLAM 0.25 MG PO TABS: 0.2500 mg | ORAL_TABLET | Freq: Every evening | ORAL | Status: DC | PRN

## 2014-12-22 NOTE — Telephone Encounter (Signed)
Caller name: [place] Relation to pt: self Call back number: [phone] Pharmacy:[facility] [address]  Reason for call: Pt came in office requesting refill for Alprazolam 0.25 mg. Please advise.

## 2014-12-22 NOTE — Telephone Encounter (Signed)
Pt was seen on 12/15/14.  It was previously ordered by historical provider. Please advise on refill.

## 2014-12-28 ENCOUNTER — Encounter: Payer: Self-pay | Admitting: Family Medicine

## 2014-12-28 DIAGNOSIS — M810 Age-related osteoporosis without current pathological fracture: Secondary | ICD-10-CM | POA: Insufficient documentation

## 2014-12-29 ENCOUNTER — Telehealth: Payer: Self-pay | Admitting: Cardiology

## 2014-12-29 NOTE — Telephone Encounter (Signed)
Patient visited office and signed an Authorization to obtain her records from [facility] for her appointment on 01/12/15 with Dr Jens Somrenshaw.  Request was faxed to [phone].  lp

## 2014-12-30 ENCOUNTER — Telehealth: Payer: Self-pay | Admitting: Cardiology

## 2014-12-30 ENCOUNTER — Other Ambulatory Visit: Payer: Self-pay

## 2014-12-30 MED ORDER — ALENDRONATE SODIUM 70 MG PO TABS: 70.0000 mg | ORAL_TABLET | ORAL | Status: DC

## 2014-12-30 NOTE — Telephone Encounter (Signed)
[facility] as requested for appointment on 01/12/15 with Dr Jens Somrenshaw.  Records did not include any procedures or testing from hospital.  Also faxed request for testing, procedures and Discharge Summaries [facility]  lp

## 2015-01-05 NOTE — Progress Notes (Signed)
HPI: 74 year old female for evaluation of coronary artery disease. Previously cared for at [facility]. Patient suffered a non-ST elevation myocardial infarction in January 2015. She had a drug-eluting stent to the Lcx but I do not have details available. She also carries a diagnosis of supraventricular tachycardia and left bundle branch block. She has seen psychiatry for problems with stress related to her previous myocardial infarction and loss of her husband. Patient does have mild dyspnea on exertion but no orthopnea or PND. No pedal edema. No chest pain or syncope.   Current Outpatient Prescriptions  Medication Sig Dispense Refill  . alendronate (FOSAMAX) 70 MG tablet Take 1 tablet (70 mg total) by mouth every 7 (seven) days. Take with a full glass of water on an empty stomach. 4 tablet 11  . ALPRAZolam (XANAX) 0.25 MG tablet Take 1 tablet (0.25 mg total) by mouth at bedtime as needed for anxiety. 30 tablet 1  . amLODipine (NORVASC) 5 MG tablet Take 1 tablet by mouth 2 (two) times daily.    Marland Kitchen aspirin 81 MG tablet Take 81 mg by mouth daily.    Marland Kitchen BRILINTA 60 MG TABS tablet Take 1 tablet by mouth 2 (two) times daily.    . Cholecalciferol 4000 UNITS CAPS Take 1 capsule by mouth daily.    . cyanocobalamin 2000 MCG tablet Take 2,000 mcg by mouth daily.    Marland Kitchen docusate sodium (COLACE) 100 MG capsule Take 100 mg by mouth 2 (two) times daily.    . famotidine (PEPCID) 20 MG tablet Take 20 mg by mouth daily.    Marland Kitchen lisinopril (PRINIVIL,ZESTRIL) 20 MG tablet Take 1 tablet by mouth 2 (two) times daily.    . metoprolol tartrate (LOPRESSOR) 25 MG tablet Take 1 tablet (25 mg total) by mouth 2 (two) times daily. 60 tablet 2  . nitroGLYCERIN (NITROSTAT) 0.4 MG SL tablet Place 0.4 mg under the tongue every 5 (five) minutes as needed for chest pain.    . pravastatin (PRAVACHOL) 40 MG tablet Take 1 tablet by mouth daily.     No current facility-administered medications for this visit.    Allergies    Allergen Reactions  . Lipitor [Atorvastatin] Other (See Comments)    uti  . Penicillins Anaphylaxis     Past Medical History  Diagnosis Date  . Arthritis   . Hyperlipidemia   . Hypertension   . CAD (coronary artery disease)   . Anxiety   . GERD (gastroesophageal reflux disease)   . Thyroid-related proptosis     Past Surgical History  Procedure Laterality Date  . No family history      Social History   Social History  . Marital Status: Widowed    Spouse Name: N/A  . Number of Children: 1  . Years of Education: N/A   Occupational History  . Not on file.   Social History Main Topics  . Smoking status: Former Games developer  . Smokeless tobacco: Never Used  . Alcohol Use: No  . Drug Use: Not on file  . Sexual Activity: Not on file   Other Topics Concern  . Not on file   Social History Narrative    Family History  Problem Relation Age of Onset  . Arthritis Mother   . Uterine cancer Maternal Grandmother   . Hyperlipidemia Mother   . Hypertension Mother   . Hypothyroidism Mother     ROS: anxiety but no fevers or chills, productive cough, hemoptysis, dysphasia, odynophagia, melena, hematochezia, dysuria, hematuria,  rash, seizure activity, orthopnea, PND, pedal edema, claudication. Remaining systems are negative.  Physical Exam:   Blood pressure 138/86, pulse 80, height 5\' 3"  (1.6 m), weight 55.339 kg (122 lb).  General:  Well developed/well nourished in NAD Skin warm/dry Patient not depressed No peripheral clubbing Back-normal HEENT-normal/normal eyelids Neck supple/normal carotid upstroke bilaterally; bilateral bruits; no JVD; no thyromegaly chest - CTA/ normal expansion CV - RRR/normal S1 and S2; no rubs or gallops;  PMI nondisplaced; 3/6 systolic murmur left sternal border. Abdomen -NT/ND, no HSM, no mass, + bowel sounds, positive bruit 2+ femoral pulses, no bruits Ext-no edema, chords, 2+ DP On the left and 1+ on the right Neuro-grossly nonfocal  ECG  Sinus rhythm with first-degree AV block. Left bundle branch block.

## 2015-01-12 ENCOUNTER — Ambulatory Visit (INDEPENDENT_AMBULATORY_CARE_PROVIDER_SITE_OTHER): Payer: Medicare Other | Admitting: Cardiology

## 2015-01-12 ENCOUNTER — Encounter: Payer: Self-pay | Admitting: Cardiology

## 2015-01-12 VITALS — BP 138/86 | HR 80 | Ht 63.0 in | Wt 122.0 lb

## 2015-01-12 DIAGNOSIS — E785 Hyperlipidemia, unspecified: Secondary | ICD-10-CM | POA: Diagnosis not present

## 2015-01-12 DIAGNOSIS — I25810 Atherosclerosis of coronary artery bypass graft(s) without angina pectoris: Secondary | ICD-10-CM

## 2015-01-12 DIAGNOSIS — R0989 Other specified symptoms and signs involving the circulatory and respiratory systems: Secondary | ICD-10-CM | POA: Diagnosis not present

## 2015-01-12 DIAGNOSIS — Z87891 Personal history of nicotine dependence: Secondary | ICD-10-CM

## 2015-01-12 DIAGNOSIS — R011 Cardiac murmur, unspecified: Secondary | ICD-10-CM | POA: Diagnosis not present

## 2015-01-12 DIAGNOSIS — I10 Essential (primary) hypertension: Secondary | ICD-10-CM

## 2015-01-12 MED ORDER — ROSUVASTATIN CALCIUM 40 MG PO TABS
40.0000 mg | ORAL_TABLET | Freq: Every day | ORAL | Status: DC
Start: 2015-01-12 — End: 2015-01-19

## 2015-01-12 MED ORDER — METOPROLOL TARTRATE 25 MG PO TABS: 25.0000 mg | ORAL_TABLET | Freq: Two times a day (BID) | ORAL | Status: DC

## 2015-01-12 NOTE — Assessment & Plan Note (Signed)
Patient sounds to have significant aortic stenosis. Plan echocardiogram to further assess.

## 2015-01-12 NOTE — Assessment & Plan Note (Signed)
Patient has both carotid and abdominal bruits. Check carotid Dopplers and abdominal ultrasound to exclude aneurysm.

## 2015-01-12 NOTE — Addendum Note (Signed)
Addended by: Freddi StarrMATHIS, Vianca Bracher W on: 01/12/2015 12:18 PM   Modules accepted: Orders

## 2015-01-12 NOTE — Patient Instructions (Signed)
Medication Instructions:   STOP BRILINTA  STOP PRAVASTATIN  START ROUVASTATIN 40 MG ONCE DAILY  Labwork:  Your physician recommends that you return for lab work in: 4 WEEKS= DO NOT EAT PRIOR TO LAB WORK  Testing/Procedures:  Your physician has requested that you have an echocardiogram. Echocardiography is a painless test that uses sound waves to create images of your heart. It provides your doctor with information about the size and shape of your heart and how well your heart's chambers and valves are working. This procedure takes approximately one hour. There are no restrictions for this procedure.   Your physician has requested that you have a carotid duplex. This test is an ultrasound of the carotid arteries in your neck. It looks at blood flow through these arteries that supply the brain with blood. Allow one hour for this exam. There are no restrictions or special instructions.   Your physician has requested that you have an abdominal aorta duplex. During this test, an ultrasound is used to evaluate the aorta. Allow 30 minutes for this exam. Do not eat after midnight the day before and avoid carbonated beverages   Follow-Up:  Your physician wants you to follow-up in: 6 MONTHS IN [place] WITH DR Jens SomRENSHAW You will receive a reminder letter in the mail two months in advance. If you don't receive a letter, please call our office to schedule the follow-up appointment.   If you need a refill on your cardiac medications before your next appointment, please call your pharmacy.

## 2015-01-12 NOTE — Addendum Note (Signed)
Addended by: Freddi StarrMATHIS, Khyler Urda W on: 01/12/2015 01:03 PM   Modules accepted: Orders

## 2015-01-12 NOTE — Addendum Note (Signed)
Addended by: Freddi StarrMATHIS, Nathasha Fiorillo W on: 01/12/2015 12:12 PM   Modules accepted: Orders

## 2015-01-12 NOTE — Assessment & Plan Note (Signed)
Continue present blood pressure medications. 

## 2015-01-12 NOTE — Assessment & Plan Note (Signed)
Continue aspirin and statin. It has been almost 2 years since her previous stent. Discontinue bilinta.

## 2015-01-12 NOTE — Assessment & Plan Note (Signed)
Given documented coronary artery disease I would prefer aggressive medical therapy. Discontinue Pravachol. Begin Crestor 40 mg daily. Check lipids and liver in 4 weeks. Note she did not tolerate Lipitor previously.

## 2015-01-14 ENCOUNTER — Ambulatory Visit (HOSPITAL_BASED_OUTPATIENT_CLINIC_OR_DEPARTMENT_OTHER)
Admission: RE | Admit: 2015-01-14 | Discharge: 2015-01-14 | Disposition: A | Discharge status: Home / Self Care | Payer: Medicare Other | Source: Ambulatory Visit | Attending: Cardiology | Admitting: Cardiology

## 2015-01-14 DIAGNOSIS — I10 Essential (primary) hypertension: Secondary | ICD-10-CM | POA: Diagnosis not present

## 2015-01-14 DIAGNOSIS — I358 Other nonrheumatic aortic valve disorders: Secondary | ICD-10-CM | POA: Insufficient documentation

## 2015-01-14 DIAGNOSIS — I351 Nonrheumatic aortic (valve) insufficiency: Secondary | ICD-10-CM | POA: Diagnosis not present

## 2015-01-14 DIAGNOSIS — I517 Cardiomegaly: Secondary | ICD-10-CM | POA: Diagnosis not present

## 2015-01-14 DIAGNOSIS — E785 Hyperlipidemia, unspecified: Secondary | ICD-10-CM | POA: Insufficient documentation

## 2015-01-14 DIAGNOSIS — R011 Cardiac murmur, unspecified: Secondary | ICD-10-CM | POA: Insufficient documentation

## 2015-01-14 DIAGNOSIS — I071 Rheumatic tricuspid insufficiency: Secondary | ICD-10-CM | POA: Diagnosis not present

## 2015-01-14 DIAGNOSIS — Z87891 Personal history of nicotine dependence: Secondary | ICD-10-CM | POA: Diagnosis not present

## 2015-01-14 DIAGNOSIS — I340 Nonrheumatic mitral (valve) insufficiency: Secondary | ICD-10-CM | POA: Insufficient documentation

## 2015-01-14 NOTE — Progress Notes (Signed)
  Echocardiogram 2D Echocardiogram has been performed.  Nicole SavoyCasey N Marlin Watkins 01/14/2015, 1:53 PM

## 2015-01-19 ENCOUNTER — Telehealth: Payer: Self-pay | Admitting: Cardiology

## 2015-01-19 MED ORDER — PRAVASTATIN SODIUM 40 MG PO TABS: 40.0000 mg | ORAL_TABLET | Freq: Every day | ORAL | Status: DC

## 2015-01-19 NOTE — Telephone Encounter (Signed)
Returned call to patient.She stated she wanted to change 11/29 appointment with Dr.Crenshaw to discuss echo results.Stated she don't like early appointments.Appointment changed with Dr.Crenshaw to 02/05/15 at 11:30 am.She also wanted Dr.Crenshaw to know she does not want to take crestor.She does not do well with new medications.Stated she wants to keep taking pravachol.Stated she was not taking pravachol regular and not following diet.She wants to follow diet better and will take pravachol 40 mg daily.

## 2015-01-19 NOTE — Telephone Encounter (Signed)
Pt is calling to speak with a nurse about a couple of prescriptions she is having some problems with. Please f/u with her   Thanks

## 2015-01-20 NOTE — Progress Notes (Signed)
HPI: FU coronary artery disease. Previously [facility]. Patient suffered a non-ST elevation myocardial infarction in January 2015. She had a drug-eluting stent to the Lcx but I do not have details available. She also carries a diagnosis of supraventricular tachycardia and left bundle branch block. She has seen psychiatry for problems with stress related to her previous myocardial infarction and loss of her husband. Echocardiogram November 2016 showed normal LV function, focal basal septal hypertrophy with left ventricular outflow tract gradient of 36 mmHg. Symptoms consistent hypertrophic obstructive cardiomyopathy. Mild aortic insufficiency, mitral regurgitation and mild left atrial enlargement. Since last seen,   Current Outpatient Prescriptions  Medication Sig Dispense Refill  . alendronate (FOSAMAX) 70 MG tablet Take 1 tablet (70 mg total) by mouth every 7 (seven) days. Take with a full glass of water on an empty stomach. 4 tablet 11  . ALPRAZolam (XANAX) 0.25 MG tablet Take 1 tablet (0.25 mg total) by mouth at bedtime as needed for anxiety. 30 tablet 1  . amLODipine (NORVASC) 5 MG tablet Take 1 tablet by mouth 2 (two) times daily.    Marland Kitchen. aspirin 81 MG tablet Take 81 mg by mouth daily.    . Cholecalciferol 4000 UNITS CAPS Take 1 capsule by mouth daily.    . cyanocobalamin 2000 MCG tablet Take 2,000 mcg by mouth daily.    Marland Kitchen. docusate sodium (COLACE) 100 MG capsule Take 100 mg by mouth 2 (two) times daily.    . famotidine (PEPCID) 20 MG tablet Take 20 mg by mouth daily.    Marland Kitchen. lisinopril (PRINIVIL,ZESTRIL) 20 MG tablet Take 1 tablet by mouth 2 (two) times daily.    . metoprolol tartrate (LOPRESSOR) 25 MG tablet Take 1 tablet (25 mg total) by mouth 2 (two) times daily. 180 tablet 3  . nitroGLYCERIN (NITROSTAT) 0.4 MG SL tablet Place 0.4 mg under the tongue every 5 (five) minutes as needed for chest pain.    . pravastatin (PRAVACHOL) 40 MG tablet Take 1 tablet (40 mg total) by  mouth daily. 30 tablet 6   No current facility-administered medications for this visit.     Past Medical History  Diagnosis Date  . Arthritis   . Hyperlipidemia   . Hypertension   . CAD (coronary artery disease)   . Anxiety   . GERD (gastroesophageal reflux disease)   . Thyroid-related proptosis     Past Surgical History  Procedure Laterality Date  . No family history      Social History   Social History  . Marital Status: Widowed    Spouse Name: N/A  . Number of Children: 1  . Years of Education: N/A   Occupational History  . Not on file.   Social History Main Topics  . Smoking status: Former Games developermoker  . Smokeless tobacco: Never Used  . Alcohol Use: No  . Drug Use: Not on file  . Sexual Activity: Not on file   Other Topics Concern  . Not on file   Social History Narrative    ROS: no fevers or chills, productive cough, hemoptysis, dysphasia, odynophagia, melena, hematochezia, dysuria, hematuria, rash, seizure activity, orthopnea, PND, pedal edema, claudication. Remaining systems are negative.  Physical Exam: Well-developed well-nourished in no acute distress.  Skin is warm and dry.  HEENT is normal.  Neck is supple.  Chest is clear to auscultation with normal expansion.  Cardiovascular exam is regular rate and rhythm.  Abdominal exam nontender or distended. No masses palpated. Extremities  show no edema. neuro grossly intact  ECG     This encounter was created in error - please disregard.

## 2015-01-27 ENCOUNTER — Encounter: Payer: No Typology Code available for payment source | Admitting: Cardiology

## 2015-01-28 ENCOUNTER — Telehealth: Payer: Self-pay | Admitting: Cardiology

## 2015-01-28 NOTE — Progress Notes (Signed)
HPI: FU coronary artery disease. [facility] Patient suffered a non-ST elevation myocardial infarction in January 2015. She had a drug-eluting stent to the Lcx but I do not have details available. She also carries a diagnosis of supraventricular tachycardia and left bundle branch block. Echocardiogram November 2016 showed normal LV function, severe focal basal septal hypertrophy with an outflow tract gradient of 36 mmHg consistent with hypertrophic obstructive cardiomyopathy; SAM. Mild aortic insufficiency and mitral regurgitation. Mild left atrial enlargement ventricular enlargement. Carotid Dopplers December 2016 showed no hemodynamically significant stenosis. Abdominal ultrasound December 2016 showed mild abdominal aortic ectasia at 2.8 cm. Follow-up recommended 5 years. She has seen psychiatry for problems with stress related to her previous myocardial infarction and loss of her husband. Since last seen, There is no dyspnea on exertion, orthopnea, PND, pedal edema, syncope or chest pain.  Current Outpatient Prescriptions  Medication Sig Dispense Refill  . alendronate (FOSAMAX) 70 MG tablet Take 1 tablet (70 mg total) by mouth every 7 (seven) days. Take with a full glass of water on an empty stomach. 4 tablet 11  . ALPRAZolam (XANAX) 0.25 MG tablet Take 1 tablet (0.25 mg total) by mouth at bedtime as needed for anxiety. 30 tablet 1  . amLODipine (NORVASC) 5 MG tablet Take 1 tablet by mouth 2 (two) times daily.    Marland Kitchen. aspirin 81 MG tablet Take 81 mg by mouth daily.    . Cholecalciferol 4000 UNITS CAPS Take 1 capsule by mouth daily.    . cyanocobalamin 2000 MCG tablet Take 2,000 mcg by mouth daily.    Marland Kitchen. docusate sodium (COLACE) 100 MG capsule Take 100 mg by mouth 2 (two) times daily.    . famotidine (PEPCID) 20 MG tablet Take 20 mg by mouth daily.    Marland Kitchen. lisinopril (PRINIVIL,ZESTRIL) 20 MG tablet Take 1 tablet by mouth 2 (two) times daily.    . metoprolol tartrate  (LOPRESSOR) 25 MG tablet Take 1 tablet (25 mg total) by mouth 2 (two) times daily. 180 tablet 3  . nitroGLYCERIN (NITROSTAT) 0.4 MG SL tablet Place 0.4 mg under the tongue every 5 (five) minutes as needed for chest pain.    . pravastatin (PRAVACHOL) 40 MG tablet Take 1 tablet (40 mg total) by mouth daily. 30 tablet 6   No current facility-administered medications for this visit.     Past Medical History  Diagnosis Date  . Arthritis   . Hyperlipidemia   . Hypertension   . CAD (coronary artery disease)   . Anxiety   . GERD (gastroesophageal reflux disease)   . Thyroid-related proptosis     Past Surgical History  Procedure Laterality Date  . No family history      Social History   Social History  . Marital Status: Widowed    Spouse Name: N/A  . Number of Children: 1  . Years of Education: N/A   Occupational History  . Not on file.   Social History Main Topics  . Smoking status: Former Games developermoker  . Smokeless tobacco: Never Used  . Alcohol Use: No  . Drug Use: Not on file  . Sexual Activity: Not on file   Other Topics Concern  . Not on file   Social History Narrative    ROS: no fevers or chills, productive cough, hemoptysis, dysphasia, odynophagia, melena, hematochezia, dysuria, hematuria, rash, seizure activity, orthopnea, PND, pedal edema, claudication. Remaining systems are negative.  Physical Exam: Well-developed well-nourished in no acute distress.  Skin is warm and dry.  HEENT is normal.  Neck is supple.  Chest is clear to auscultation with normal expansion.  Cardiovascular exam is regular rate and rhythm. 3/6 systolic murmur left sternal border that increases with Valsalva. Abdominal exam nontender or distended. No masses palpated. Extremities show no edema. neuro grossly intact  ECG 01/12/2015-sinus rhythm, first-degree AV block, left bundle branch block.

## 2015-01-28 NOTE — Telephone Encounter (Signed)
Received [facility] for appointment on 02/05/15 with Dr Jens Somrenshaw.  [facility] for Dr Ludwig Clarksrenshaw's schedule on 02/05/15. lp

## 2015-01-29 ENCOUNTER — Other Ambulatory Visit: Payer: Self-pay | Admitting: Cardiology

## 2015-01-29 ENCOUNTER — Telehealth: Payer: Self-pay | Admitting: Cardiology

## 2015-01-29 DIAGNOSIS — R0989 Other specified symptoms and signs involving the circulatory and respiratory systems: Secondary | ICD-10-CM

## 2015-01-29 DIAGNOSIS — I359 Nonrheumatic aortic valve disorder, unspecified: Secondary | ICD-10-CM

## 2015-01-29 DIAGNOSIS — I358 Other nonrheumatic aortic valve disorders: Secondary | ICD-10-CM

## 2015-01-29 DIAGNOSIS — I10 Essential (primary) hypertension: Secondary | ICD-10-CM

## 2015-01-29 DIAGNOSIS — Z136 Encounter for screening for cardiovascular disorders: Secondary | ICD-10-CM

## 2015-01-29 DIAGNOSIS — Z87891 Personal history of nicotine dependence: Secondary | ICD-10-CM

## 2015-01-29 NOTE — Telephone Encounter (Signed)
Crystal is calling to inquire about an order for a test the pt will be having. Please assist  Thanks

## 2015-01-29 NOTE — Telephone Encounter (Signed)
Got call from Crys at [facility]. Pt having US of aorta.  She informs me the test code will need to be changed as the current order (medicare screen) has criteria for patient to be female.  She will change code, will be same procedure but she will need physician to sign off.  Routed to Dr. Jens Somrenshaw.

## 2015-01-29 NOTE — Telephone Encounter (Signed)
Ok  Nicole Watkins  

## 2015-02-03 ENCOUNTER — Ambulatory Visit (HOSPITAL_BASED_OUTPATIENT_CLINIC_OR_DEPARTMENT_OTHER)
Admission: RE | Admit: 2015-02-03 | Discharge: 2015-02-03 | Disposition: A | Discharge status: Home / Self Care | Payer: Medicare Other | Source: Ambulatory Visit | Attending: Cardiology | Admitting: Cardiology

## 2015-02-03 ENCOUNTER — Ambulatory Visit (HOSPITAL_BASED_OUTPATIENT_CLINIC_OR_DEPARTMENT_OTHER): Admission: RE | Admit: 2015-02-03 | Payer: Medicare Other | Source: Ambulatory Visit

## 2015-02-03 ENCOUNTER — Ambulatory Visit (HOSPITAL_BASED_OUTPATIENT_CLINIC_OR_DEPARTMENT_OTHER): Payer: No Typology Code available for payment source

## 2015-02-03 DIAGNOSIS — I10 Essential (primary) hypertension: Secondary | ICD-10-CM | POA: Diagnosis not present

## 2015-02-03 DIAGNOSIS — I77811 Abdominal aortic ectasia: Secondary | ICD-10-CM | POA: Diagnosis not present

## 2015-02-03 DIAGNOSIS — I6523 Occlusion and stenosis of bilateral carotid arteries: Secondary | ICD-10-CM | POA: Diagnosis not present

## 2015-02-03 DIAGNOSIS — I700 Atherosclerosis of aorta: Secondary | ICD-10-CM | POA: Diagnosis not present

## 2015-02-03 DIAGNOSIS — R0989 Other specified symptoms and signs involving the circulatory and respiratory systems: Secondary | ICD-10-CM | POA: Diagnosis not present

## 2015-02-03 DIAGNOSIS — I779 Disorder of arteries and arterioles, unspecified: Secondary | ICD-10-CM | POA: Insufficient documentation

## 2015-02-05 ENCOUNTER — Encounter: Payer: Self-pay | Admitting: Cardiology

## 2015-02-05 ENCOUNTER — Ambulatory Visit (INDEPENDENT_AMBULATORY_CARE_PROVIDER_SITE_OTHER): Payer: Medicare Other | Admitting: Cardiology

## 2015-02-05 DIAGNOSIS — I2583 Coronary atherosclerosis due to lipid rich plaque: Secondary | ICD-10-CM

## 2015-02-05 DIAGNOSIS — I714 Abdominal aortic aneurysm, without rupture, unspecified: Secondary | ICD-10-CM

## 2015-02-05 DIAGNOSIS — I421 Obstructive hypertrophic cardiomyopathy: Secondary | ICD-10-CM | POA: Diagnosis not present

## 2015-02-05 DIAGNOSIS — I2510 Atherosclerotic heart disease of native coronary artery without angina pectoris: Secondary | ICD-10-CM | POA: Diagnosis not present

## 2015-02-05 MED ORDER — LISINOPRIL 20 MG PO TABS: 20.0000 mg | ORAL_TABLET | Freq: Two times a day (BID) | ORAL | Status: DC

## 2015-02-05 MED ORDER — AMLODIPINE BESYLATE 5 MG PO TABS: 5.0000 mg | ORAL_TABLET | Freq: Two times a day (BID) | ORAL | Status: DC

## 2015-02-05 NOTE — Assessment & Plan Note (Signed)
Patient will need follow-up abdominal ultrasound for mildly dilated aorta in December 2021.

## 2015-02-05 NOTE — Patient Instructions (Signed)
Medication Instructions:   NO CHANGE  Testing/Procedures:  Your physician has requested that you have an exercise tolerance test. For further information please visit [email or web address]. Please also follow instruction sheet, as given.   Your physician has recommended that you wear a 24 HOUR holter monitor. Holter monitors are medical devices that record the heart's electrical activity. Doctors most often use these monitors to diagnose arrhythmias. Arrhythmias are problems with the speed or rhythm of the heartbeat. The monitor is a small, portable device. You can wear one while you do your normal daily activities. This is usually used to diagnose what is causing palpitations/syncope (passing out).[facility]    Follow-Up:  Your physician wants you to follow-up in: 6 MONTHS WITH DR Jens SomRENSHAW You will receive a reminder letter in the mail two months in advance. If you don't receive a letter, please call our office to schedule the follow-up appointment.   If you need a refill on your cardiac medications before your next appointment, please call your pharmacy.

## 2015-02-05 NOTE — Assessment & Plan Note (Signed)
Noted on echocardiogram.She is not having any symptoms. Continue beta blocker. No history of syncope or family history of sudden death. Schedule 24-hour Holter monitor to exclude nonsustained ventricular tachycardia. Schedule exercise treadmill to evaluate systolic blood pressure with exercise. I have explained that she will need to have her daughter screened.

## 2015-02-05 NOTE — Assessment & Plan Note (Signed)
Blood pressure controlled. Continue present medications. 

## 2015-02-05 NOTE — Assessment & Plan Note (Signed)
Continue aspirin and statin. 

## 2015-02-05 NOTE — Assessment & Plan Note (Signed)
Continue Pravachol.She did not tolerate Lipitor previously. She states Crestor was too expensive.

## 2015-03-18 ENCOUNTER — Other Ambulatory Visit: Payer: Self-pay | Admitting: Cardiology

## 2015-03-18 DIAGNOSIS — I447 Left bundle-branch block, unspecified: Secondary | ICD-10-CM

## 2015-03-18 DIAGNOSIS — I421 Obstructive hypertrophic cardiomyopathy: Secondary | ICD-10-CM

## 2015-03-18 DIAGNOSIS — I440 Atrioventricular block, first degree: Secondary | ICD-10-CM

## 2015-03-19 ENCOUNTER — Ambulatory Visit (INDEPENDENT_AMBULATORY_CARE_PROVIDER_SITE_OTHER): Payer: Medicare Other

## 2015-03-19 ENCOUNTER — Encounter: Payer: Medicare Other | Admitting: Physician Assistant

## 2015-03-19 DIAGNOSIS — I421 Obstructive hypertrophic cardiomyopathy: Secondary | ICD-10-CM

## 2015-03-19 DIAGNOSIS — I447 Left bundle-branch block, unspecified: Secondary | ICD-10-CM

## 2015-03-19 DIAGNOSIS — I440 Atrioventricular block, first degree: Secondary | ICD-10-CM

## 2015-03-19 LAB — EXERCISE TOLERANCE TEST
CHL CUP RESTING HR STRESS: 85 {beats}/min
CHL RATE OF PERCEIVED EXERTION: 14
CSEPED: 4 min
Estimated workload: 3.3 METS
Exercise duration (sec): 40 s
MPHR: 146 {beats}/min
Peak HR: 118 {beats}/min
Percent HR: 81 %

## 2015-06-16 ENCOUNTER — Observation Stay (HOSPITAL_COMMUNITY)
Admission: EM | Admit: 2015-06-16 | Discharge: 2015-06-17 | Disposition: A | Discharge status: Home / Self Care | Payer: Medicare Other | Attending: Internal Medicine | Admitting: Internal Medicine

## 2015-06-16 ENCOUNTER — Encounter (HOSPITAL_COMMUNITY): Payer: Self-pay | Admitting: Emergency Medicine

## 2015-06-16 ENCOUNTER — Emergency Department (HOSPITAL_COMMUNITY): Payer: Medicare Other

## 2015-06-16 DIAGNOSIS — Z955 Presence of coronary angioplasty implant and graft: Secondary | ICD-10-CM | POA: Insufficient documentation

## 2015-06-16 DIAGNOSIS — R404 Transient alteration of awareness: Secondary | ICD-10-CM | POA: Diagnosis not present

## 2015-06-16 DIAGNOSIS — Z79899 Other long term (current) drug therapy: Secondary | ICD-10-CM | POA: Insufficient documentation

## 2015-06-16 DIAGNOSIS — E860 Dehydration: Secondary | ICD-10-CM | POA: Diagnosis present

## 2015-06-16 DIAGNOSIS — Z87891 Personal history of nicotine dependence: Secondary | ICD-10-CM | POA: Diagnosis not present

## 2015-06-16 DIAGNOSIS — I447 Left bundle-branch block, unspecified: Secondary | ICD-10-CM | POA: Diagnosis present

## 2015-06-16 DIAGNOSIS — R55 Syncope and collapse: Secondary | ICD-10-CM | POA: Diagnosis not present

## 2015-06-16 DIAGNOSIS — Z9861 Coronary angioplasty status: Secondary | ICD-10-CM

## 2015-06-16 DIAGNOSIS — E079 Disorder of thyroid, unspecified: Secondary | ICD-10-CM | POA: Diagnosis present

## 2015-06-16 DIAGNOSIS — I10 Essential (primary) hypertension: Secondary | ICD-10-CM | POA: Diagnosis present

## 2015-06-16 DIAGNOSIS — I493 Ventricular premature depolarization: Secondary | ICD-10-CM | POA: Diagnosis present

## 2015-06-16 DIAGNOSIS — I252 Old myocardial infarction: Secondary | ICD-10-CM | POA: Insufficient documentation

## 2015-06-16 DIAGNOSIS — E785 Hyperlipidemia, unspecified: Secondary | ICD-10-CM | POA: Insufficient documentation

## 2015-06-16 DIAGNOSIS — F411 Generalized anxiety disorder: Secondary | ICD-10-CM | POA: Diagnosis not present

## 2015-06-16 DIAGNOSIS — W109XXA Fall (on) (from) unspecified stairs and steps, initial encounter: Secondary | ICD-10-CM | POA: Diagnosis not present

## 2015-06-16 DIAGNOSIS — I421 Obstructive hypertrophic cardiomyopathy: Secondary | ICD-10-CM | POA: Diagnosis not present

## 2015-06-16 DIAGNOSIS — K219 Gastro-esophageal reflux disease without esophagitis: Secondary | ICD-10-CM | POA: Diagnosis not present

## 2015-06-16 DIAGNOSIS — Z7982 Long term (current) use of aspirin: Secondary | ICD-10-CM | POA: Insufficient documentation

## 2015-06-16 DIAGNOSIS — Z7983 Long term (current) use of bisphosphonates: Secondary | ICD-10-CM | POA: Insufficient documentation

## 2015-06-16 DIAGNOSIS — I2510 Atherosclerotic heart disease of native coronary artery without angina pectoris: Secondary | ICD-10-CM | POA: Diagnosis not present

## 2015-06-16 DIAGNOSIS — I459 Conduction disorder, unspecified: Secondary | ICD-10-CM | POA: Diagnosis present

## 2015-06-16 DIAGNOSIS — I422 Other hypertrophic cardiomyopathy: Secondary | ICD-10-CM | POA: Diagnosis not present

## 2015-06-16 DIAGNOSIS — Y92009 Unspecified place in unspecified non-institutional (private) residence as the place of occurrence of the external cause: Secondary | ICD-10-CM | POA: Diagnosis not present

## 2015-06-16 HISTORY — DX: Left bundle-branch block, unspecified: I44.7

## 2015-06-16 LAB — CBC WITH DIFFERENTIAL/PLATELET
Basophils Absolute: 0 10*3/uL (ref 0.0–0.1)
Basophils Relative: 0 %
EOS ABS: 0.1 10*3/uL (ref 0.0–0.7)
Eosinophils Relative: 1 %
HEMATOCRIT: 43.9 % (ref 36.0–46.0)
HEMOGLOBIN: 14.1 g/dL (ref 12.0–15.0)
Lymphocytes Relative: 11 %
Lymphs Abs: 1 10*3/uL (ref 0.7–4.0)
MCH: 29.2 pg (ref 26.0–34.0)
MCHC: 32.1 g/dL (ref 30.0–36.0)
MCV: 90.9 fL (ref 78.0–100.0)
MONOS PCT: 9 %
Monocytes Absolute: 0.8 10*3/uL (ref 0.1–1.0)
NEUTROS PCT: 79 %
Neutro Abs: 7.6 10*3/uL (ref 1.7–7.7)
Platelets: 261 10*3/uL (ref 150–400)
RBC: 4.83 MIL/uL (ref 3.87–5.11)
RDW: 14.1 % (ref 11.5–15.5)
WBC: 9.6 10*3/uL (ref 4.0–10.5)

## 2015-06-16 LAB — I-STAT CHEM 8, ED
BUN: 25 mg/dL — AB (ref 6–20)
CALCIUM ION: 1.2 mmol/L (ref 1.13–1.30)
Chloride: 104 mmol/L (ref 101–111)
Creatinine, Ser: 0.9 mg/dL (ref 0.44–1.00)
Glucose, Bld: 119 mg/dL — ABNORMAL HIGH (ref 65–99)
HCT: 46 % (ref 36.0–46.0)
Hemoglobin: 15.6 g/dL — ABNORMAL HIGH (ref 12.0–15.0)
Potassium: 3.9 mmol/L (ref 3.5–5.1)
SODIUM: 141 mmol/L (ref 135–145)
TCO2: 27 mmol/L (ref 0–100)

## 2015-06-16 LAB — I-STAT TROPONIN, ED: Troponin i, poc: 0 ng/mL (ref 0.00–0.08)

## 2015-06-16 NOTE — ED Notes (Signed)
Per EMS, pt from home with c/o syncope. Pt collapsed and hit her head on the carpeted stairs. Denies headache. BP-162/104, HR-102, 98%ra CBG-114.

## 2015-06-16 NOTE — ED Provider Notes (Signed)
CSN: [id number]     Arrival date & time 06/16/15  2117 History   First MD Initiated Contact with Patient 06/16/15 2125     Chief Complaint  Patient presents with  . Near Syncope      Patient is a 75 y.o. female presenting with near-syncope. The history is provided by the patient.  Near Syncope Pertinent negatives include no chest pain, no abdominal pain, no headaches and no shortness of breath.  Patient presents with syncope. States she had gone down the stairs and began to feel lightheaded. She crouched down and then passed out. She was only unconscious for quickly regained consciousness. Reportedly has not been drinking much today. No fevers. No headache. Does have some slight pain in her upper neck on the left side. Reportedly hit the back of her head on the stairs. No chest pain. No trouble breathing. Has previous cardiac stent. Sees Dr. Jens Somrenshaw. Poorly had a recent Holter monitor. Reviewed records shows she has hypertrophic obstructive cardiomyopathy.  Past Medical History  Diagnosis Date  . Arthritis   . Hyperlipidemia   . Hypertension   . CAD (coronary artery disease)   . Anxiety   . GERD (gastroesophageal reflux disease)   . Thyroid-related proptosis    Past Surgical History  Procedure Laterality Date  . No family history    . Cardiac surgery      stent placed 03/04/13   Family History  Problem Relation Age of Onset  . Arthritis Mother   . Hyperlipidemia Mother   . Hypertension Mother   . Hypothyroidism Mother   . Uterine cancer Maternal Grandmother    Social History  Substance Use Topics  . Smoking status: Former Games developermoker  . Smokeless tobacco: Never Used  . Alcohol Use: No   OB History    No data available     Review of Systems  Constitutional: Negative for activity change and appetite change.  Eyes: Negative for pain.  Respiratory: Negative for chest tightness and shortness of breath.   Cardiovascular: Positive for near-syncope. Negative for chest pain and  leg swelling.  Gastrointestinal: Negative for nausea, vomiting, abdominal pain and diarrhea.  Genitourinary: Negative for flank pain.  Musculoskeletal: Negative for back pain and neck stiffness.  Skin: Negative for rash.  Neurological: Positive for syncope. Negative for weakness, numbness and headaches.  Psychiatric/Behavioral: Negative for behavioral problems.      Allergies  Lipitor and Penicillins  Home Medications   Prior to Admission medications   Medication Sig Start Date End Date Taking? Authorizing Provider  ALPRAZolam (XANAX) 0.25 MG tablet Take 1 tablet (0.25 mg total) by mouth at bedtime as needed for anxiety. 12/22/14  Yes [name], DO  amLODipine (NORVASC) 5 MG tablet Take 1 tablet (5 mg total) by mouth 2 (two) times daily. 02/05/15  Yes Lewayne BuntingBrian S Crenshaw, MD  aspirin 81 MG tablet Take 81 mg by mouth daily.   Yes Historical Provider, MD  Cholecalciferol 4000 UNITS CAPS Take 1 capsule by mouth daily.   Yes Historical Provider, MD  cyanocobalamin 2000 MCG tablet Take 2,000 mcg by mouth daily.   Yes Historical Provider, MD  docusate sodium (COLACE) 100 MG capsule Take 100 mg by mouth 2 (two) times daily.   Yes Historical Provider, MD  famotidine (PEPCID) 20 MG tablet Take 20 mg by mouth daily.   Yes Historical Provider, MD  lisinopril (PRINIVIL,ZESTRIL) 20 MG tablet Take 1 tablet (20 mg total) by mouth 2 (two) times daily. 02/05/15  Yes  Lewayne Bunting, MD  metoprolol tartrate (LOPRESSOR) 25 MG tablet Take 1 tablet (25 mg total) by mouth 2 (two) times daily. 01/12/15  Yes Lewayne Bunting, MD  nitroGLYCERIN (NITROSTAT) 0.4 MG SL tablet Place 0.4 mg under the tongue every 5 (five) minutes as needed for chest pain.   Yes Historical Provider, MD  pravastatin (PRAVACHOL) 40 MG tablet Take 1 tablet (40 mg total) by mouth daily. 01/19/15  Yes Lewayne Bunting, MD  alendronate (FOSAMAX) 70 MG tablet Take 1 tablet (70 mg total) by mouth every 7 (seven) days. Take with a full  glass of water on an empty stomach. 12/30/14   [name], DO   BP 152/103 mmHg  Pulse 89  Resp 24  SpO2 99% Physical Exam  Constitutional: She appears well-developed and well-nourished.  HENT:  Head: Atraumatic.  Eyes: EOM are normal.  Neck: Neck supple.  Cardiovascular: Normal rate.   Murmur heard. Systolic murmur  Pulmonary/Chest: Effort normal.  Abdominal: Soft.  Musculoskeletal: Normal range of motion.  Neurological: She is alert.  Skin: Skin is warm.    ED Course  Procedures (including critical care time) Labs Review Labs Reviewed  I-STAT CHEM 8, ED - Abnormal; Notable for the following:    BUN 25 (*)    Glucose, Bld 119 (*)    Hemoglobin 15.6 (*)    All other components within normal limits  CBC WITH DIFFERENTIAL/PLATELET  Rosezena Sensor, ED    Imaging Review Dg Chest 2 View  06/16/2015  CLINICAL DATA:  Syncope.  Fall striking head. EXAM: CHEST  2 VIEW COMPARISON:  None. FINDINGS: The lungs are hyperinflated with coarse interstitial markings. Heart is normal in size. Coronary stent versus coronary artery calcifications. Mild tortuosity of thoracic aorta. No pulmonary edema, pleural effusion, confluent airspace disease or pneumothorax. The bones appear under mineralized. No acute fractures seen. IMPRESSION: 1. Hyperinflation with chronic lung disease. 2. Atherosclerosis.  Coronary stent versus calcifications. Electronically Signed   By: Rubye Oaks M.D.   On: 06/16/2015 22:44   Ct Head Wo Contrast  06/16/2015  CLINICAL DATA:  Syncope, fall, possibly striking head. EXAM: CT HEAD WITHOUT CONTRAST TECHNIQUE: Contiguous axial images were obtained from the base of the skull through the vertex without intravenous contrast. COMPARISON:  None. FINDINGS: Incidental xanthogranulomas of the choroid plexus (benign, incidental structures) in the lateral ventricles. These warrant no further workup. Small remote lacunar infarcts in the external capsules bilaterally.  Otherwise, the brainstem, cerebellum, cerebral peduncles, thalami, basal ganglia, basilar cisterns, and ventricular system appear within normal limits. Periventricular white matter and corona radiata hypodensities favor chronic ischemic microvascular white matter disease. No intracranial hemorrhage, mass lesion, or acute CVA. There is atherosclerotic calcification of the cavernous carotid arteries bilaterally. IMPRESSION: 1. No acute intracranial findings. 2. 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 Electronically Signed   By: Gaylyn Rong M.D.   On: 06/16/2015 23:16   I have personally reviewed and evaluated these images and lab results as part of my medical decision-making.   EKG Interpretation   Date/Time:  Tuesday June 16 2015 21:46:48 EDT Ventricular Rate:  94 PR Interval:  209 QRS Duration: 163 QT Interval:  399 QTC Calculation: 499 R Axis:   -8 Text Interpretation:  Sinus rhythm Borderline prolonged PR interval  Probable left atrial enlargement Left bundle branch block Baseline wander  in lead(s) V1 Confirmed by Rubin Payor  MD, Nijah Tejera [phone]) on 06/16/2015  9:52:35 PM  MDM   Final diagnoses:  Syncope, unspecified syncope type  Hypertrophic obstructive cardiomyopathy (HCC)    Patient was syncope. EKG at reported baseline. Does have a history of hypertrophic obstructive cardiomyopathy. Head CT reassuring after she had her head. Will admit to internal medicine.    Benjiman Core, MD 06/17/15 [phone]

## 2015-06-17 ENCOUNTER — Encounter (HOSPITAL_COMMUNITY): Payer: Self-pay | Admitting: Family Medicine

## 2015-06-17 ENCOUNTER — Telehealth: Payer: Self-pay | Admitting: Behavioral Health

## 2015-06-17 ENCOUNTER — Other Ambulatory Visit: Payer: Self-pay | Admitting: Cardiology

## 2015-06-17 ENCOUNTER — Encounter (HOSPITAL_COMMUNITY)
Admission: EM | Disposition: A | Discharge status: Home / Self Care | Payer: Self-pay | Source: Home / Self Care | Attending: Emergency Medicine

## 2015-06-17 ENCOUNTER — Encounter: Payer: Self-pay | Admitting: Behavioral Health

## 2015-06-17 DIAGNOSIS — I421 Obstructive hypertrophic cardiomyopathy: Secondary | ICD-10-CM

## 2015-06-17 DIAGNOSIS — R55 Syncope and collapse: Secondary | ICD-10-CM

## 2015-06-17 DIAGNOSIS — I10 Essential (primary) hypertension: Secondary | ICD-10-CM

## 2015-06-17 DIAGNOSIS — K219 Gastro-esophageal reflux disease without esophagitis: Secondary | ICD-10-CM | POA: Diagnosis present

## 2015-06-17 DIAGNOSIS — E860 Dehydration: Secondary | ICD-10-CM | POA: Diagnosis present

## 2015-06-17 DIAGNOSIS — Z9861 Coronary angioplasty status: Secondary | ICD-10-CM

## 2015-06-17 DIAGNOSIS — E079 Disorder of thyroid, unspecified: Secondary | ICD-10-CM | POA: Diagnosis present

## 2015-06-17 DIAGNOSIS — F411 Generalized anxiety disorder: Secondary | ICD-10-CM | POA: Diagnosis not present

## 2015-06-17 DIAGNOSIS — I2510 Atherosclerotic heart disease of native coronary artery without angina pectoris: Secondary | ICD-10-CM

## 2015-06-17 DIAGNOSIS — I493 Ventricular premature depolarization: Secondary | ICD-10-CM | POA: Diagnosis present

## 2015-06-17 DIAGNOSIS — I447 Left bundle-branch block, unspecified: Secondary | ICD-10-CM

## 2015-06-17 HISTORY — PX: EP IMPLANTABLE DEVICE: SHX172B

## 2015-06-17 LAB — URINALYSIS, ROUTINE W REFLEX MICROSCOPIC
BILIRUBIN URINE: NEGATIVE
Glucose, UA: NEGATIVE mg/dL
Ketones, ur: NEGATIVE mg/dL
Leukocytes, UA: NEGATIVE
Nitrite: NEGATIVE
PROTEIN: NEGATIVE mg/dL
SPECIFIC GRAVITY, URINE: 1.007 (ref 1.005–1.030)
pH: 7 (ref 5.0–8.0)

## 2015-06-17 LAB — TROPONIN I
TROPONIN I: 0.03 ng/mL (ref <0.031)
TROPONIN I: 0.04 ng/mL — AB (ref <0.031)
Troponin I: 0.04 ng/mL — ABNORMAL HIGH (ref <0.031)

## 2015-06-17 LAB — URINE MICROSCOPIC-ADD ON
Bacteria, UA: NONE SEEN
WBC, UA: NONE SEEN WBC/hpf (ref 0–5)

## 2015-06-17 LAB — MAGNESIUM: Magnesium: 2.1 mg/dL (ref 1.7–2.4)

## 2015-06-17 LAB — TSH: TSH: 1.223 u[IU]/mL (ref 0.350–4.500)

## 2015-06-17 LAB — GLUCOSE, CAPILLARY: GLUCOSE-CAPILLARY: 116 mg/dL — AB (ref 65–99)

## 2015-06-17 LAB — PHOSPHORUS: PHOSPHORUS: 3.3 mg/dL (ref 2.5–4.6)

## 2015-06-17 SURGERY — LOOP RECORDER INSERTION
Anesthesia: LOCAL

## 2015-06-17 MED ORDER — METOPROLOL TARTRATE 25 MG PO TABS
25.0000 mg | ORAL_TABLET | Freq: Two times a day (BID) | ORAL | Status: DC
Start: 2015-06-17 — End: 2015-06-17
  Filled 2015-06-17: qty 1

## 2015-06-17 MED ORDER — NITROGLYCERIN 0.4 MG SL SUBL: 0.4000 mg | SUBLINGUAL_TABLET | SUBLINGUAL | Status: DC | PRN

## 2015-06-17 MED ORDER — ALPRAZOLAM 0.25 MG PO TABS
0.1250 mg | ORAL_TABLET | Freq: Every evening | ORAL | Status: DC | PRN
  Administered 2015-06-17: 0.125 mg via ORAL

## 2015-06-17 MED ORDER — ASPIRIN EC 81 MG PO TBEC
81.0000 mg | DELAYED_RELEASE_TABLET | Freq: Every day | ORAL | Status: DC
  Administered 2015-06-17: 81 mg via ORAL
  Filled 2015-06-17: qty 1

## 2015-06-17 MED ORDER — DOCUSATE SODIUM 100 MG PO CAPS
100.0000 mg | ORAL_CAPSULE | Freq: Two times a day (BID) | ORAL | Status: DC
  Administered 2015-06-17: 100 mg via ORAL
  Filled 2015-06-17: qty 1

## 2015-06-17 MED ORDER — KCL IN DEXTROSE-NACL 20-5-0.45 MEQ/L-%-% IV SOLN
INTRAVENOUS | Status: DC
  Administered 2015-06-17: 02:00:00 via INTRAVENOUS
  Filled 2015-06-17: qty 1000

## 2015-06-17 MED ORDER — ACETAMINOPHEN 325 MG PO TABS: 650.0000 mg | ORAL_TABLET | Freq: Four times a day (QID) | ORAL | Status: DC | PRN

## 2015-06-17 MED ORDER — SODIUM CHLORIDE 0.9% FLUSH
3.0000 mL | Freq: Two times a day (BID) | INTRAVENOUS | Status: DC
  Administered 2015-06-17: 3 mL via INTRAVENOUS

## 2015-06-17 MED ORDER — FAMOTIDINE 20 MG PO TABS
20.0000 mg | ORAL_TABLET | Freq: Every day | ORAL | Status: DC
  Filled 2015-06-17: qty 1

## 2015-06-17 MED ORDER — ACETAMINOPHEN 650 MG RE SUPP: 650.0000 mg | Freq: Four times a day (QID) | RECTAL | Status: DC | PRN

## 2015-06-17 MED ORDER — AMLODIPINE BESYLATE 5 MG PO TABS: 5.0000 mg | ORAL_TABLET | Freq: Two times a day (BID) | ORAL | Status: DC

## 2015-06-17 MED ORDER — ONDANSETRON HCL 4 MG/2ML IJ SOLN: 4.0000 mg | Freq: Four times a day (QID) | INTRAMUSCULAR | Status: DC | PRN

## 2015-06-17 MED ORDER — LISINOPRIL 10 MG PO TABS
20.0000 mg | ORAL_TABLET | Freq: Two times a day (BID) | ORAL | Status: DC
  Administered 2015-06-17 (×2): 20 mg via ORAL
  Filled 2015-06-17: qty 2

## 2015-06-17 MED ORDER — LIDOCAINE-EPINEPHRINE 1 %-1:100000 IJ SOLN
INTRAMUSCULAR | Status: AC
  Filled 2015-06-17: qty 1

## 2015-06-17 MED ORDER — LIDOCAINE-EPINEPHRINE 1 %-1:100000 IJ SOLN
INTRAMUSCULAR | Status: DC | PRN
  Administered 2015-06-17: 13 mL via INTRADERMAL

## 2015-06-17 MED ORDER — AMLODIPINE BESYLATE 5 MG PO TABS
5.0000 mg | ORAL_TABLET | Freq: Two times a day (BID) | ORAL | Status: DC
  Administered 2015-06-17 (×2): 5 mg via ORAL
  Filled 2015-06-17 (×2): qty 1

## 2015-06-17 MED ORDER — ONDANSETRON HCL 4 MG PO TABS: 4.0000 mg | ORAL_TABLET | Freq: Four times a day (QID) | ORAL | Status: DC | PRN

## 2015-06-17 MED ORDER — PRAVASTATIN SODIUM 40 MG PO TABS
40.0000 mg | ORAL_TABLET | Freq: Every day | ORAL | Status: DC
  Filled 2015-06-17: qty 1

## 2015-06-17 MED ORDER — METOPROLOL TARTRATE 25 MG PO TABS
25.0000 mg | ORAL_TABLET | Freq: Two times a day (BID) | ORAL | Status: DC
  Administered 2015-06-17 (×2): 25 mg via ORAL
  Filled 2015-06-17: qty 1

## 2015-06-17 MED ORDER — ALPRAZOLAM 0.25 MG PO TABS
0.2500 mg | ORAL_TABLET | Freq: Every evening | ORAL | Status: DC | PRN
  Filled 2015-06-17: qty 1

## 2015-06-17 MED ORDER — LISINOPRIL 10 MG PO TABS
20.0000 mg | ORAL_TABLET | Freq: Two times a day (BID) | ORAL | Status: DC
  Filled 2015-06-17: qty 2

## 2015-06-17 SURGICAL SUPPLY — 2 items
LOOP REVEAL LINQSYS (Prosthesis & Implant Heart) ×2 IMPLANT
PACK LOOP INSERTION (CUSTOM PROCEDURE TRAY) ×2 IMPLANT

## 2015-06-17 NOTE — Progress Notes (Signed)
Pt walked in hall approx 200 ft with standby assist, using RW.  No complaints, no tele events.  Back to bed after walk with daughter present.  Will con't plan of care.

## 2015-06-17 NOTE — Discharge Instructions (Signed)
Follow with Dr. Graciela HusbandsKlein. His office will contact you.  Please get a complete blood count and chemistry panel checked by your Primary MD at your next visit, and again as instructed by your Primary MD. Please get your medications reviewed and adjusted by your Primary MD.  Please request your Primary MD to go over all Hospital Tests and Procedure/Radiological results at the follow up, please get all Hospital records sent to your Prim MD by signing hospital release before you go home.  If you had Pneumonia of Lung problems at the Hospital: Please get a 2 view Chest X ray done in 6-8 weeks after hospital discharge or sooner if instructed by your Primary MD.  If you have Congestive Heart Failure: Please call your Cardiologist or Primary MD anytime you have any of the following symptoms:  1) 3 pound weight gain in 24 hours or 5 pounds in 1 week  2) shortness of breath, with or without a dry hacking cough  3) swelling in the hands, feet or stomach  4) if you have to sleep on extra pillows at night in order to breathe  Follow cardiac low salt diet and 1.5 lit/day fluid restriction.  If you have diabetes Accuchecks 4 times/day, Once in AM empty stomach and then before each meal. Log in all results and show them to your primary doctor at your next visit. If any glucose reading is under 80 or above 300 call your primary MD immediately.  If you have Seizure/Convulsions/Epilepsy: Please do not drive, operate heavy machinery, participate in activities at heights or participate in high speed sports until you have seen by Primary MD or a Neurologist and advised to do so again.  If you had Gastrointestinal Bleeding: Please ask your Primary MD to check a complete blood count within one week of discharge or at your next visit. Your endoscopic/colonoscopic biopsies that are pending at the time of discharge, will also need to followed by your Primary MD.  Get Medicines reviewed and adjusted. Please take all  your medications with you for your next visit with your Primary MD  Please request your Primary MD to go over all hospital tests and procedure/radiological results at the follow up, please ask your Primary MD to get all Hospital records sent to his/her office.  If you experience worsening of your admission symptoms, develop shortness of breath, life threatening emergency, suicidal or homicidal thoughts you must seek medical attention immediately by calling 911 or calling your MD immediately  if symptoms less severe.  You must read complete instructions/literature along with all the possible adverse reactions/side effects for all the Medicines you take and that have been prescribed to you. Take any new Medicines after you have completely understood and accpet all the possible adverse reactions/side effects.   Do not drive or operate heavy machinery when taking Pain medications.   Do not take more than prescribed Pain, Sleep and Anxiety Medications  Special Instructions: If you have smoked or chewed Tobacco  in the last 2 yrs please stop smoking, stop any regular Alcohol  and or any Recreational drug use.  Wear Seat belts while driving.  Please note You were cared for by a hospitalist during your hospital stay. If you have any questions about your discharge medications or the care you received while you were in the hospital after you are discharged, you can call the unit and asked to speak with the hospitalist on call if the hospitalist that took care of you is not available.  Once you are discharged, your primary care physician will handle any further medical issues. Please note that NO REFILLS for any discharge medications will be authorized once you are discharged, as it is imperative that you return to your primary care physician (or establish a relationship with a primary care physician if you do not have one) for your aftercare needs so that they can reassess your need for medications and monitor  your lab values.  You can reach the hospitalist office at phone [phone] or fax [phone]   If you do not have a primary care physician, you can call [phone] for a physician referral.  Activity: As tolerated with Full fall precautions use walker/cane & assistance as needed  Diet: heart healthy  Disposition Home

## 2015-06-17 NOTE — Progress Notes (Signed)
Patient seen and examined this morning, admitted overnight with a syncopal episode, without any prior history of syncopal episodes.  HOCM / syncopal episode - echo pending - Cards consulted, appreciate input  CAD - She is certain there was no chest pain with her syncopal episode - Troponins negative overnight, 0.04 this morning, repeat another one  Costin M. Elvera LennoxGherghe, MD [facility] [phone]

## 2015-06-17 NOTE — Consult Note (Addendum)
Reason for Consult:   Syncope and collaps  Requesting Physician: [facility] Primary Cardiologist Dr Jens Somrenshaw  HPI:   75 [facility]. Patient suffered a NSTEMI in January 2015. She had a DES to the Lcx. She also carries a diagnosis of PSVT and LBBB. Echo in November 2016 showed normal LV function, severe focal basal septal hypertrophy with an LVOT gradient of 36 mmHg consistent with HOCM. She was admitted 06/16/15 after a syncopal episode which appears to be secondary to dehydration and HOCM. She denies chest pain. She admits she doesn't eat and drink as much as she should. She denies any palpitations or sustained tachycardia. Telemetry shows NSR, 1st AVB, and PVCs. Carotid dopplers done in Jan 2017 were without significant stenosis. She had a treadmill in Jan 2017 but it was cancelled after a few minutes secondary to fatigue and LBBB.   PMHx:  Past Medical History  Diagnosis Date  . Arthritis   . Hyperlipidemia   . Hypertension   . CAD (coronary artery disease)   . Anxiety   . GERD (gastroesophageal reflux disease)   . Thyroid-related proptosis   . LBBB (left bundle branch block)     Past Surgical History  Procedure Laterality Date  . No family history    . Cardiac surgery      stent placed 03/04/13    SOCHx:  reports that she has quit smoking. She has never used smokeless tobacco. She reports that she does not drink alcohol. Her drug history is not on file.  FAMHx: Family History  Problem Relation Age of Onset  . Arthritis Mother   . Hyperlipidemia Mother   . Hypertension Mother   . Hypothyroidism Mother   . Uterine cancer Maternal Grandmother     ALLERGIES: Allergies  Allergen Reactions  . Lipitor [Atorvastatin] Other (See Comments)    uti  . Penicillins Anaphylaxis    ROS: Review of Systems: General: negative for chills, fever, night sweats or weight changes.  Cardiovascular: negative for chest pain, dyspnea  on exertion, edema, orthopnea, palpitations, paroxysmal nocturnal dyspnea or shortness of breath HEENT: negative for any visual disturbances, blindness, glaucoma Dermatological: negative for rash Respiratory: negative for cough, hemoptysis, or wheezing Urologic: negative for hematuria or dysuria Abdominal: negative for nausea, vomiting, diarrhea, bright red blood per rectum, melena, or hematemesis Neurologic: negative for visual changes Musculoskeletal: negative for back pain, joint pain, or swelling Psych: cooperative and appropriate All other systems reviewed and are otherwise negative except as noted above.   HOME MEDICATIONS: Prior to Admission medications   Medication Sig Start Date End Date Taking? Authorizing Provider  ALPRAZolam (XANAX) 0.25 MG tablet Take 1 tablet (0.25 mg total) by mouth at bedtime as needed for anxiety. 12/22/14  Yes [name], DO  amLODipine (NORVASC) 5 MG tablet Take 1 tablet (5 mg total) by mouth 2 (two) times daily. 02/05/15  Yes Lewayne BuntingBrian S Crenshaw, MD  aspirin 81 MG tablet Take 81 mg by mouth daily.   Yes Historical Provider, MD  Cholecalciferol 4000 UNITS CAPS Take 1 capsule by mouth daily.   Yes Historical Provider, MD  cyanocobalamin 2000 MCG tablet Take 2,000 mcg by mouth daily.   Yes Historical Provider, MD  docusate sodium (COLACE) 100 MG capsule Take 100 mg by mouth 2 (two) times daily.   Yes Historical Provider, MD  famotidine (PEPCID) 20 MG tablet Take 20 mg by mouth daily.   Yes  Historical Provider, MD  lisinopril (PRINIVIL,ZESTRIL) 20 MG tablet Take 1 tablet (20 mg total) by mouth 2 (two) times daily. 02/05/15  Yes Lewayne Bunting, MD  metoprolol tartrate (LOPRESSOR) 25 MG tablet Take 1 tablet (25 mg total) by mouth 2 (two) times daily. 01/12/15  Yes Lewayne Bunting, MD  nitroGLYCERIN (NITROSTAT) 0.4 MG SL tablet Place 0.4 mg under the tongue every 5 (five) minutes as needed for chest pain. Reported on 06/17/2015   Yes Historical Provider,  MD  pravastatin (PRAVACHOL) 40 MG tablet Take 1 tablet (40 mg total) by mouth daily. 01/19/15  Yes Lewayne Bunting, MD  alendronate (FOSAMAX) 70 MG tablet Take 1 tablet (70 mg total) by mouth every 7 (seven) days. Take with a full glass of water on an empty stomach. Patient not taking: Reported on 06/17/2015 12/30/14   Donato Schultz, DO    HOSPITAL MEDICATIONS: I have reviewed the patient's current medications.  VITALS: Blood pressure 106/59, pulse 76, temperature 98.8 F (37.1 C), temperature source Oral, resp. rate 18, weight 124 lb 9 oz (56.5 kg), SpO2 96 %.  PHYSICAL EXAM: General appearance: alert, cooperative and no distress Neck: no carotid bruit and no JVD Lungs: crackles Lt base Heart: regular rate and rhythm and 2/6 systolic murmur LSB and AOV Abdomen: soft, non-tender; bowel sounds normal; no masses,  no organomegaly Extremities: extremities normal, atraumatic, no cyanosis or edema Pulses: 2+ and symmetric Skin: Skin color, texture, turgor normal. No rashes or lesions Neurologic: Grossly normal  LABS: Results for orders placed or performed during the hospital encounter of 06/16/15 (from the past 24 hour(s))  CBC with Differential     Status: None   Collection Time: 06/16/15 10:26 PM  Result Value Ref Range   WBC 9.6 4.0 - 10.5 K/uL   RBC 4.83 3.87 - 5.11 MIL/uL   Hemoglobin 14.1 12.0 - 15.0 g/dL   HCT 16.1 09.6 - 04.5 %   MCV 90.9 78.0 - 100.0 fL   MCH 29.2 26.0 - 34.0 pg   MCHC 32.1 30.0 - 36.0 g/dL   RDW 40.9 81.1 - 91.4 %   Platelets 261 150 - 400 K/uL   Neutrophils Relative % 79 %   Neutro Abs 7.6 1.7 - 7.7 K/uL   Lymphocytes Relative 11 %   Lymphs Abs 1.0 0.7 - 4.0 K/uL   Monocytes Relative 9 %   Monocytes Absolute 0.8 0.1 - 1.0 K/uL   Eosinophils Relative 1 %   Eosinophils Absolute 0.1 0.0 - 0.7 K/uL   Basophils Relative 0 %   Basophils Absolute 0.0 0.0 - 0.1 K/uL  I-stat Chem 8, ED     Status: Abnormal   Collection Time: 06/16/15 10:39 PM    Result Value Ref Range   Sodium 141 135 - 145 mmol/L   Potassium 3.9 3.5 - 5.1 mmol/L   Chloride 104 101 - 111 mmol/L   BUN 25 (H) 6 - 20 mg/dL   Creatinine, Ser 7.82 0.44 - 1.00 mg/dL   Glucose, Bld 956 (H) 65 - 99 mg/dL   Calcium, Ion 2.13 0.86 - 1.30 mmol/L   TCO2 27 0 - 100 mmol/L   Hemoglobin 15.6 (H) 12.0 - 15.0 g/dL   HCT 57.8 46.9 - 62.9 %  I-stat troponin, ED     Status: None   Collection Time: 06/16/15 11:09 PM  Result Value Ref Range   Troponin i, poc 0.00 0.00 - 0.08 ng/mL   Comment 3  Troponin I     Status: None   Collection Time: 06/17/15  2:11 AM  Result Value Ref Range   Troponin I 0.03 <0.031 ng/mL  Magnesium     Status: None   Collection Time: 06/17/15  2:11 AM  Result Value Ref Range   Magnesium 2.1 1.7 - 2.4 mg/dL  Phosphorus     Status: None   Collection Time: 06/17/15  2:11 AM  Result Value Ref Range   Phosphorus 3.3 2.5 - 4.6 mg/dL  Urinalysis, Routine w reflex microscopic (not at [facility])     Status: Abnormal   Collection Time: 06/17/15  3:34 AM  Result Value Ref Range   Color, Urine STRAW (A) YELLOW   APPearance CLEAR CLEAR   Specific Gravity, Urine 1.007 1.005 - 1.030   pH 7.0 5.0 - 8.0   Glucose, UA NEGATIVE NEGATIVE mg/dL   Hgb urine dipstick SMALL (A) NEGATIVE   Bilirubin Urine NEGATIVE NEGATIVE   Ketones, ur NEGATIVE NEGATIVE mg/dL   Protein, ur NEGATIVE NEGATIVE mg/dL   Nitrite NEGATIVE NEGATIVE   Leukocytes, UA NEGATIVE NEGATIVE  Urine microscopic-add on     Status: Abnormal   Collection Time: 06/17/15  3:34 AM  Result Value Ref Range   Squamous Epithelial / LPF 0-5 (A) NONE SEEN   WBC, UA NONE SEEN 0 - 5 WBC/hpf   RBC / HPF 0-5 0 - 5 RBC/hpf   Bacteria, UA NONE SEEN NONE SEEN  TSH     Status: None   Collection Time: 06/17/15  5:50 AM  Result Value Ref Range   TSH 1.223 0.350 - 4.500 uIU/mL  Glucose, capillary     Status: Abnormal   Collection Time: 06/17/15  6:43 AM  Result Value Ref Range   Glucose-Capillary 116 (H) 65 -  99 mg/dL  Troponin I     Status: Abnormal   Collection Time: 06/17/15  8:01 AM  Result Value Ref Range   Troponin I 0.04 (H) <0.031 ng/mL    EKG: NSR, LBBB, PVCs, 1st AVB  IMAGING: Dg Chest 2 View  06/16/2015  CLINICAL DATA:  Syncope.  Fall striking head. EXAM: CHEST  2 VIEW COMPARISON:  None. FINDINGS: The lungs are hyperinflated with coarse interstitial markings. Heart is normal in size. Coronary stent versus coronary artery calcifications. Mild tortuosity of thoracic aorta. No pulmonary edema, pleural effusion, confluent airspace disease or pneumothorax. The bones appear under mineralized. No acute fractures seen. IMPRESSION: 1. Hyperinflation with chronic lung disease. 2. Atherosclerosis.  Coronary stent versus calcifications. Electronically Signed   By: Rubye Oaks M.D.   On: 06/16/2015 22:44   Ct Head Wo Contrast  06/16/2015  CLINICAL DATA:  Syncope, fall, possibly striking head. EXAM: CT HEAD WITHOUT CONTRAST TECHNIQUE: Contiguous axial images were obtained from the base of the skull through the vertex without intravenous contrast. COMPARISON:  None. FINDINGS: Incidental xanthogranulomas of the choroid plexus (benign, incidental structures) in the lateral ventricles. These warrant no further workup. Small remote lacunar infarcts in the external capsules bilaterally. Otherwise, the brainstem, cerebellum, cerebral peduncles, thalami, basal ganglia, basilar cisterns, and ventricular system appear within normal limits. Periventricular white matter and corona radiata hypodensities favor chronic ischemic microvascular white matter disease. No intracranial hemorrhage, mass lesion, or acute CVA. There is atherosclerotic calcification of the cavernous carotid arteries bilaterally. IMPRESSION: 1. No acute intracranial findings. 2. 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 Electronically Signed   By: Gaylyn Rong M.D.   On:  06/16/2015 23:16    IMPRESSION: Principal Problem:   Syncope and collapse Active Problems:   Hypertrophic obstructive cardiomyopathy (HCC)   Dehydration   CAD S/P CFX PCI Jan 2015   HTN (hypertension)   LBBB (left bundle branch block)   Hyperlipidemia LDL goal <100   Generalized anxiety disorder   Thyroid disorder-PCP follows   GERD (gastroesophageal reflux disease)   PVC's (premature ventricular contractions)   RECOMMENDATION: Pt improved after hydration. Will review B/P medications with MD- she had not taken her AM meds when I saw her this am.   Time Spent Directly with Patient: 40 minutes  Corine Shelter, [place]  [phone] beeper 06/17/2015, 1:17 PM  The patient has been seen in conjunction with Corine Shelter, PA-C. All aspects of care have been considered and discussed. The patient has been personally interviewed, examined, and all clinical data has been reviewed.    Hypertrophic obstructive cardiomyopathy with documented murmur and outflow gradient(36 mmHg) by clinical exam and echocardiography. Patient also has a history of coronary artery disease with previous stent in the left circumflex artery during ACS 2 years ago. On this occasion she had syncope after coming down a flight of stairs. There was momentary weakness and within a second or 2 she collapsed. This was observed by her son-in-law and daughter.She awakened promptly with no residual.  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 Dehydration superimposed on LV outflow obstruction from hypertrophic obstructive cardiomyopathy 2. Arrhythmia (tachycardia versus bradycardia), 3. Hypotensive effect of medications, and 4. Neurally mediated mechanism (since there is a prior history of 2 previous  fainting episodes).  Recommendations: No driving; 30 day monitor vs loop recorder implantation; myocardial perfusion imaging to exclude progression of coronary disease/ischemia; avoid medications that can aggravate LV outflow obstruction in the setting of hypertrophic obstructive cardiomyopathy (will hold amlodipine); avoid medications that aggravate AV conduction given trifascicular block; IV and oral hydration; All could be done as OP if Loop not needed.  Will need to have an EP consultation.

## 2015-06-17 NOTE — Progress Notes (Signed)
Pt back from loop recorder placement.  Site unremarkable.  Family at bedside.  Pt alert and well oriented, no complaints.  Will con't plan of care.

## 2015-06-17 NOTE — Progress Notes (Deleted)
Subjective:  No problems overnight. She denies any history of chest pain recently. She admits she doesn't eat and drink enough, especially since her husband passed.   Objective:  Vital Signs in the last 24 hours: Temp:  [97.8 F (36.6 C)] 97.8 F (36.6 C) (04/19 0158) Pulse Rate:  [81-106] 106 (04/19 0205) Resp:  [20-24] 20 (04/19 0158) BP: (129-154)/(87-103) 129/93 mmHg (04/19 0205) SpO2:  [96 %-99 %] 97 % (04/19 0158) Weight:  [124 lb 9 oz (56.5 kg)] 124 lb 9 oz (56.5 kg) (04/19 0158)  Intake/Output from previous day:  Intake/Output Summary (Last 24 hours) at 06/17/15 1116 Last data filed at 06/17/15 0300  Gross per 24 hour  Intake      0 ml  Output    400 ml  Net   -400 ml    Physical Exam: General appearance: alert, cooperative and no distress Neck: no JVD and transmitted murmur Lungs: crackles Lt base Heart: regular rate and rhythm and 2/6 systolic murmur LSB, AOV Extremities: no edema Neurologic: Grossly normal   Rate: 88  Rhythm: normal sinus rhythm, premature ventricular contractions (PVC) and LBBB  Lab Results:  Recent Labs  06/16/15 2226 06/16/15 2239  WBC 9.6  --   HGB 14.1 15.6*  PLT 261  --     Recent Labs  06/16/15 2239  NA 141  K 3.9  CL 104  GLUCOSE 119*  BUN 25*  CREATININE 0.90    Recent Labs  06/17/15 0211 06/17/15 0801  TROPONINI 0.03 0.04*   No results for input(s): INR in the last 72 hours.  Scheduled Meds: . amLODipine  5 mg Oral BID  . aspirin EC  81 mg Oral Daily  . docusate sodium  100 mg Oral BID  . famotidine  20 mg Oral Daily  . lisinopril  20 mg Oral BID  . metoprolol tartrate  25 mg Oral BID  . pravastatin  40 mg Oral q1800  . sodium chloride flush  3 mL Intravenous Q12H   Continuous Infusions: . dextrose 5 % and 0.45 % NaCl with KCl 20 mEq/L 100 mL/hr at 06/17/15 0148   PRN Meds:.acetaminophen **OR** acetaminophen, ALPRAZolam, nitroGLYCERIN, ondansetron **OR** ondansetron (ZOFRAN)  IV   Imaging: Imaging results have been reviewed  Cardiac Studies: Echo Nov 2016 Study Conclusions  - Left ventricle: Wall thickness was increased in a pattern of mild  LVH. There was severe focal basal hypertrophy of the septum.  There is a rest gradient of 36 mmHg peak in the LVOT, consistent  with HOCM. Valsalva was not performed. Systolic function was low  normal. The estimated ejection fraction was in the range of 50%  to 55%. Incoordinate septal motion. Doppler parameters are  consistent with abnormal left ventricular relaxation (grade 1  diastolic dysfunction). The E/e&' ratio is >15, suggesting  elevated LV filling pressure. - Aortic valve: Sclerosis without stenosis. Transvalvular velocity  was minimally increased, due to increased LVOT VTI. There was  mild regurgitation. Valve area (VTI): 3.76 cm^2. Valve area  (Vmax): 4.14 cm^2. Valve area (Vmean): 3.79 cm^2. - Mitral valve: Mildly thickened leaflets . SAM is noted. There was  mild regurgitation. - Left atrium: The atrium was mildly dilated. - Right ventricle: The cavity size was mildly dilated. Systolic  function was low normal. Lateral annulus peak S velocity: 10  cm/s. - Right atrium: The atrium was at the upper limits of normal in  size. - Tricuspid valve: There was mild regurgitation. - Pulmonary arteries: PA  peak pressure: 28 mm Hg (S). - Systemic veins: The IVC measures <1.2 cm and spontaneously  collapses, suggesting volume depletion and a low RA pressure of 2  mmHg.  Assessment/Plan:  75 y/o female previously cared for at [facility]. Patient suffered a NSTEMI  in January 2015. She had a DES to the Lcx. She also carries a diagnosis of PSVT and LBBB. Echo in November 2016 showed normal LV function, severe focal basal septal hypertrophy with an LVOT gradient of 36 mmHg consistent with HOCM. She was admitted 06/16/15 after a syncopal episode which appears to be secondary to dehydration and  HOCM.   Principal Problem:   Syncope and collapse Active Problems:   CAD S/P CFX PCI Jan 2015   Hypertrophic obstructive cardiomyopathy (HCC)   HTN (hypertension)   LBBB (left bundle branch block)   Hyperlipidemia LDL goal <100   Generalized anxiety disorder   Thyroid disorder-PCP follows   GERD (gastroesophageal reflux disease)   PVC's (premature ventricular contractions)   PLAN: I spoke with the pt's daughter at length- she is an [employer] fundraiser. She asked if her mother's B/P medication shouldn't be cut back, will review with MD this am. For now DC IVF, take this am's medications and ambulate. Another echo was ordered on admission by attending. Will check another Troponin as her 3'd was not normal- 0.04. Pt instructed on the importance of staying hydrated.   [employer] PA-C 06/17/2015, 11:16 AM [phone]

## 2015-06-17 NOTE — Telephone Encounter (Signed)
Pre-Visit Call completed with patient and chart updated.   Pre-Visit Info documented in Specialty Comments under [employer].    

## 2015-06-17 NOTE — H&P (Signed)
[facility] History and Physical  Nicole BorsJuliette Salomon ZOX:[id number] DOB: [birth year] DOA: 06/16/2015  Referring physician:  PCP: Donato SchultzYvonne R Lowne Chase, DO   Chief Complaint: "I thought she was looking at something but then when she fell backwards I knew she had passed out."-Daughter  HPI: Nicole Watkins is a 75 y.o. female with past medical history of arthritis, hyperlipidemia, hypertension, coronary artery disease, anxiety, reflux, thyroid disease and obstructive cardiomyopathy presented to the emergency room after syncope. Patient states that over last few days she hasn't felt the best. Unable to specify exactly how long. The day of presentation to the ED the patient was feeling off and went down the stairs after going down the stairs she felt very dizzy she thought this was a episode of her vertigo so she attempted to place her hand on the wall to try to steady herself then her daughter witnessed her slide down into a stooping position. Shortly after that the patient fell backwards and hit her head on the steps. Patient then was unconscious for a few seconds. The patient then came to the ER.  Patient denies any headache, blurry vision, double vision, trouble hearing, trouble speaking, chest pain, shortness of breath, nausea, vomiting, no abdominal pain, cough, wheeze, dysuria, weakness.  Note patient has only had one bottle of water in the last 48 hours. By mouth intake has been ongoing problem with the patient.   CODE STATUS full  Review of Systems:  Pertinent ROS in history of present illness All other systems were reviewed and are negative.  Past Medical History  Diagnosis Date  . Arthritis   . Hyperlipidemia   . Hypertension   . CAD (coronary artery disease)   . Anxiety   . GERD (gastroesophageal reflux disease)   . Thyroid-related proptosis    Past Surgical History  Procedure Laterality Date  . No family history    . Cardiac surgery      stent placed 03/04/13   Social  History:  reports that she has quit smoking. She has never used smokeless tobacco. She reports that she does not drink alcohol. Her drug history is not on file.  Allergies  Allergen Reactions  . Lipitor [Atorvastatin] Other (See Comments)    uti  . Penicillins Anaphylaxis    Family History  Problem Relation Age of Onset  . Arthritis Mother   . Hyperlipidemia Mother   . Hypertension Mother   . Hypothyroidism Mother   . Uterine cancer Maternal Grandmother      Prior to Admission medications   Medication Sig Start Date End Date Taking? Authorizing Provider  ALPRAZolam (XANAX) 0.25 MG tablet Take 1 tablet (0.25 mg total) by mouth at bedtime as needed for anxiety. 12/22/14  Yes [name], DO  amLODipine (NORVASC) 5 MG tablet Take 1 tablet (5 mg total) by mouth 2 (two) times daily. 02/05/15  Yes Lewayne BuntingBrian S Crenshaw, MD  aspirin 81 MG tablet Take 81 mg by mouth daily.   Yes Historical Provider, MD  Cholecalciferol 4000 UNITS CAPS Take 1 capsule by mouth daily.   Yes Historical Provider, MD  cyanocobalamin 2000 MCG tablet Take 2,000 mcg by mouth daily.   Yes Historical Provider, MD  docusate sodium (COLACE) 100 MG capsule Take 100 mg by mouth 2 (two) times daily.   Yes Historical Provider, MD  famotidine (PEPCID) 20 MG tablet Take 20 mg by mouth daily.   Yes Historical Provider, MD  lisinopril (PRINIVIL,ZESTRIL) 20 MG tablet Take 1 tablet (20 mg  total) by mouth 2 (two) times daily. 02/05/15  Yes Lewayne Bunting, MD  metoprolol tartrate (LOPRESSOR) 25 MG tablet Take 1 tablet (25 mg total) by mouth 2 (two) times daily. 01/12/15  Yes Lewayne Bunting, MD  nitroGLYCERIN (NITROSTAT) 0.4 MG SL tablet Place 0.4 mg under the tongue every 5 (five) minutes as needed for chest pain.   Yes Historical Provider, MD  pravastatin (PRAVACHOL) 40 MG tablet Take 1 tablet (40 mg total) by mouth daily. 01/19/15  Yes Lewayne Bunting, MD  alendronate (FOSAMAX) 70 MG tablet Take 1 tablet (70 mg total) by  mouth every 7 (seven) days. Take with a full glass of water on an empty stomach. 12/30/14   Donato Schultz, DO   Physical Exam: Filed Vitals:   06/17/15 0100 06/17/15 0158 06/17/15 0202 06/17/15 0205  BP: 140/91 143/87 154/98 129/93  Pulse: 81 90 88 106  Temp:  97.8 F (36.6 C)    TempSrc:  Oral    Resp: 22 20    Weight:  56.5 kg (124 lb 9 oz)    SpO2: 96% 97%      Wt Readings from Last 3 Encounters:  06/17/15 56.5 kg (124 lb 9 oz)  02/05/15 54.885 kg (121 lb)  01/12/15 55.339 kg (122 lb)    General:  Appears calm and comfortable; mild dehydration Eyes:  PERRL, EOMI, normal lids, iris ENT:  grossly normal hearing, lips & tongue Neck:  no LAD, masses or thyromegaly Cardiovascular:  RRR, no m/r/g. No LE edema.  Respiratory:  CTA bilaterally, no w/r/r. Normal respiratory effort. Abdomen:  soft, ntnd Skin:  no rash or induration seen on limited exam.Small contusion, back of head. Musculoskeletal:  grossly normal tone BUE/BLE Psychiatric:  grossly normal mood and affect, speech fluent and appropriate Neurologic:  CN 2-12 grossly intact, moves all extremities in coordinated fashion.          Labs on Admission:  Basic Metabolic Panel:  Recent Labs Lab 06/16/15 2239  NA 141  K 3.9  CL 104  GLUCOSE 119*  BUN 25*  CREATININE 0.90   Liver Function Tests: No results for input(s): AST, ALT, ALKPHOS, BILITOT, PROT, ALBUMIN in the last 168 hours. No results for input(s): LIPASE, AMYLASE in the last 168 hours. No results for input(s): AMMONIA in the last 168 hours. CBC:  Recent Labs Lab 06/16/15 2226 06/16/15 2239  WBC 9.6  --   NEUTROABS 7.6  --   HGB 14.1 15.6*  HCT 43.9 46.0  MCV 90.9  --   PLT 261  --    Cardiac Enzymes: No results for input(s): CKTOTAL, CKMB, CKMBINDEX, TROPONINI in the last 168 hours.  BNP (last 3 results) No results for input(s): BNP in the last 8760 hours.  ProBNP (last 3 results) No results for input(s): PROBNP in the last 8760  hours.   CREATININE: 0.9 (06/16/15 2239) Estimated creatinine clearance - 46.6 mL/min  CBG: No results for input(s): GLUCAP in the last 168 hours.  Radiological Exams on Admission: Dg Chest 2 View  06/16/2015  CLINICAL DATA:  Syncope.  Fall striking head. EXAM: CHEST  2 VIEW COMPARISON:  None. FINDINGS: The lungs are hyperinflated with coarse interstitial markings. Heart is normal in size. Coronary stent versus coronary artery calcifications. Mild tortuosity of thoracic aorta. No pulmonary edema, pleural effusion, confluent airspace disease or pneumothorax. The bones appear under mineralized. No acute fractures seen. IMPRESSION: 1. Hyperinflation with chronic lung disease. 2. Atherosclerosis.  Coronary stent versus  calcifications. Electronically Signed   By: Rubye Oaks M.D.   On: 06/16/2015 22:44   Ct Head Wo Contrast  06/16/2015  CLINICAL DATA:  Syncope, fall, possibly striking head. EXAM: CT HEAD WITHOUT CONTRAST TECHNIQUE: Contiguous axial images were obtained from the base of the skull through the vertex without intravenous contrast. COMPARISON:  None. FINDINGS: Incidental xanthogranulomas of the choroid plexus (benign, incidental structures) in the lateral ventricles. These warrant no further workup. Small remote lacunar infarcts in the external capsules bilaterally. Otherwise, the brainstem, cerebellum, cerebral peduncles, thalami, basal ganglia, basilar cisterns, and ventricular system appear within normal limits. Periventricular white matter and corona radiata hypodensities favor chronic ischemic microvascular white matter disease. No intracranial hemorrhage, mass lesion, or acute CVA. There is atherosclerotic calcification of the cavernous carotid arteries bilaterally. IMPRESSION: 1. No acute intracranial findings. 2. 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 Electronically Signed   By: Gaylyn Rong M.D.   On:  06/16/2015 23:16    EKG: Independently reviewed. Left bundle branch block and first-degree AV block. No STEMI.  Assessment/Plan Principal Problem:   Syncope Active Problems:   CAD (coronary artery disease)   HTN (hypertension)   Hyperlipidemia LDL goal <100   Generalized anxiety disorder   Hypertrophic obstructive cardiomyopathy (HCC)   Thyroid disorder   GERD (gastroesophageal reflux disease)   Syncope -Patient has a history of hypertrophic cardiomyopathy but does not have a history of syncope this could and is likely influenced the patient's low by mouth intake.Her condition combined with dehydration Checking UA to see if decreased appetite be due to UTI the patient has no symptoms Some crackles on exam but chest x-ray negative repeat may be necessary as patient is hydrated but does not present like someone has pneumonia CT head negative, no signs of concussion exam Blood pressure stable We'll consult patient's cardiologist group Echo ordered Pt's cardiologist will need to be consulted in the AM Check mag, phos Serial trop EKG w/o acute change  Mild dehydration - IV hydration therapy  HLD - on medication Continue patient's home statin  GAD -Well controlled Continue patient's home Xanax  Thyroid Disease -Patient uncertain of diagnosis Check TSH  GERD -Well controlled Continue home Pepcid  Hypertension Well-controlled Continue patient's home Norvasc, lisinopril, Lopressor  Coronary artery disease -Controlled Continue patient's home aspirin and nitroglycerin when necessary    Code Status: full  DVT Prophylaxis: scds Family Communication: dgtr Disposition Plan: Pending Improvement    Haydee Salter, MD Family Medicine [facility] [email or web address] Password [email or web address]

## 2015-06-17 NOTE — Care Management Obs Status (Signed)
MEDICARE OBSERVATION STATUS NOTIFICATION   Patient Details  Name: Nicole BorsJuliette Watkins MRN: [id number] Date of Birth: 29-Dec-1940   Medicare Observation Status Notification Given:  Yes    Darrold SpanWebster, Beckie Viscardi Hall, RN 06/17/2015, 3:12 PM

## 2015-06-17 NOTE — Discharge Summary (Signed)
Physician Discharge Summary  [name] ZOX:[id number] DOB: Dec 29, 1940 DOA: 06/16/2015  PCP: Donato Schultz, DO  Admit date: 06/16/2015 Discharge date: 06/17/2015  Time spent: > 30 minutes  Recommendations for Outpatient Follow-up:  1. Follow up with Dr. Graciela Husbands, his office will schedule an appointment 2. Implantable loop recorder was placed prior to patient's discharge   Discharge Diagnoses:  Principal Problem:   Syncope and collapse Active Problems:   CAD S/P CFX PCI Jan 2015   LBBB (left bundle branch block)   HTN (hypertension)   Hyperlipidemia LDL goal <100   Generalized anxiety disorder   Hypertrophic obstructive cardiomyopathy (HCC)   Thyroid disorder-PCP follows   GERD (gastroesophageal reflux disease)   PVC's (premature ventricular contractions)   Dehydration  Discharge Condition: stable  Diet recommendation: heart healthy  Filed Weights   06/17/15 0158  Weight: 56.5 kg (124 lb 9 oz)   History of present illness:  See H&P, Labs, Consult and Test reports for all details in brief, patient is a Nicole Watkins is a 75 y.o. female with past medical history of arthritis, hyperlipidemia, hypertension, coronary artery disease, anxiety, reflux, thyroid disease and obstructive cardiomyopathy presented to the emergency room after syncope.   Hospital Course:  Patient was admitted to the hospital due to a syncopal event. In the setting of her history of hypertrophic cardiomyopathy, cardiology was consulted and have evaluated patient hospitalized, both general cardiology as well as electrophysiology. It was felt like she is high risk for having a cardiac related arrhythmia as the etiology for her syncope, thus patient had an implantable loop recorder placed. Her blood pressure has been stable and clinically she has been stable hospitalized, her lisinopril as well as metoprolol were continued, however per cardiology her amlodipine was discontinued. I discussed with Dr. Clide Cliff  in general cardiology, patient is cleared for discharge from their standpoint, and she will be sent home in stable condition. She had mild troponin leak of 0.04, likely demand, flat trend. Her other chronic medical problems were stable and no further medication changes were done to her home regimen.  Procedures:  ILD implant   Consultations:  General Cardiology   EP  Discharge Exam: Filed Vitals:   06/17/15 0158 06/17/15 0202 06/17/15 0205 06/17/15 1310  BP: 143/87 154/98 129/93 106/59  Pulse: 90 88 106 76  Temp: 97.8 F (36.6 C)   98.8 F (37.1 C)  TempSrc: Oral   Oral  Resp: 20   18  Weight: 56.5 kg (124 lb 9 oz)     SpO2: 97%   96%   General: NAD Cardiovascular: RRR Respiratory: CTA biL  Discharge Instructions Activity:  As tolerated   Get Medicines reviewed and adjusted: Please take all your medications with you for your next visit with your Primary MD  Please request your Primary MD to go over all hospital tests and procedure/radiological results at the follow up, please ask your Primary MD to get all Hospital records sent to his/her office.  If you experience worsening of your admission symptoms, develop shortness of breath, life threatening emergency, suicidal or homicidal thoughts you must seek medical attention immediately by calling 911 or calling your MD immediately if symptoms less severe.  You must read complete instructions/literature along with all the possible adverse reactions/side effects for all the Medicines you take and that have been prescribed to you. Take any new Medicines after you have completely understood and accpet all the possible adverse reactions/side effects.   Do not drive when  taking Pain medications.   Do not take more than prescribed Pain, Sleep and Anxiety Medications  Special Instructions: If you have smoked or chewed Tobacco in the last 2 yrs please stop smoking, stop any regular Alcohol and or any Recreational drug  use.  Wear Seat belts while driving.  Please note  You were cared for by a hospitalist during your hospital stay. Once you are discharged, your primary care physician will handle any further medical issues. Please note that NO REFILLS for any discharge medications will be authorized once you are discharged, as it is imperative that you return to your primary care physician (or establish a relationship with a primary care physician if you do not have one) for your aftercare needs so that they can reassess your need for medications and monitor your lab values.    Medication List    STOP taking these medications        amLODipine 5 MG tablet  Commonly known as:  NORVASC      TAKE these medications        alendronate 70 MG tablet  Commonly known as:  FOSAMAX  Take 1 tablet (70 mg total) by mouth every 7 (seven) days. Take with a full glass of water on an empty stomach.     ALPRAZolam 0.25 MG tablet  Commonly known as:  XANAX  Take 1 tablet (0.25 mg total) by mouth at bedtime as needed for anxiety.     aspirin 81 MG tablet  Take 81 mg by mouth daily.     Cholecalciferol 4000 units Caps  Take 1 capsule by mouth daily.     cyanocobalamin 2000 MCG tablet  Take 2,000 mcg by mouth daily.     docusate sodium 100 MG capsule  Commonly known as:  COLACE  Take 100 mg by mouth 2 (two) times daily.     famotidine 20 MG tablet  Commonly known as:  PEPCID  Take 20 mg by mouth daily.     lisinopril 20 MG tablet  Commonly known as:  PRINIVIL,ZESTRIL  Take 1 tablet (20 mg total) by mouth 2 (two) times daily.     metoprolol tartrate 25 MG tablet  Commonly known as:  LOPRESSOR  Take 1 tablet (25 mg total) by mouth 2 (two) times daily.     nitroGLYCERIN 0.4 MG SL tablet  Commonly known as:  NITROSTAT  Place 0.4 mg under the tongue every 5 (five) minutes as needed for chest pain. Reported on 06/17/2015     pravastatin 40 MG tablet  Commonly known as:  PRAVACHOL  Take 1 tablet (40 mg  total) by mouth daily.           Follow-up Information    Follow up with Lesleigh NoeSMITH III,HENRY W, MD.   Specialty:  Cardiology   Why:  office will contact you   Contact information:   [address] [place] [address] [phone]       The results of significant diagnostics from this hospitalization (including imaging, microbiology, ancillary and laboratory) are listed below for reference.    Significant Diagnostic Studies: Dg Chest 2 View  06/16/2015  CLINICAL DATA:  Syncope.  Fall striking head. EXAM: CHEST  2 VIEW COMPARISON:  None. FINDINGS: The lungs are hyperinflated with coarse interstitial markings. Heart is normal in size. Coronary stent versus coronary artery calcifications. Mild tortuosity of thoracic aorta. No pulmonary edema, pleural effusion, confluent airspace disease or pneumothorax. The bones appear under mineralized. No acute  fractures seen. IMPRESSION: 1. Hyperinflation with chronic lung disease. 2. Atherosclerosis.  Coronary stent versus calcifications. Electronically Signed   By: Rubye Oaks M.D.   On: 06/16/2015 22:44   Ct Head Wo Contrast  06/16/2015  CLINICAL DATA:  Syncope, fall, possibly striking head. EXAM: CT HEAD WITHOUT CONTRAST TECHNIQUE: Contiguous axial images were obtained from the base of the skull through the vertex without intravenous contrast. COMPARISON:  None. FINDINGS: Incidental xanthogranulomas of the choroid plexus (benign, incidental structures) in the lateral ventricles. These warrant no further workup. Small remote lacunar infarcts in the external capsules bilaterally. Otherwise, the brainstem, cerebellum, cerebral peduncles, thalami, basal ganglia, basilar cisterns, and ventricular system appear within normal limits. Periventricular white matter and corona radiata hypodensities favor chronic ischemic microvascular white matter disease. No intracranial hemorrhage, mass lesion, or acute CVA. There is atherosclerotic  calcification of the cavernous carotid arteries bilaterally. IMPRESSION: 1. No acute intracranial findings. 2. 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 Electronically Signed   By: Gaylyn Rong M.D.   On: 06/16/2015 23:16    Microbiology: No results found for this or any previous visit (from the past 240 hour(s)).   Labs: Basic Metabolic Panel:  Recent Labs Lab 06/16/15 2239 06/17/15 0211  NA 141  --   K 3.9  --   CL 104  --   GLUCOSE 119*  --   BUN 25*  --   CREATININE 0.90  --   MG  --  2.1  PHOS  --  3.3   Liver Function Tests: No results for input(s): AST, ALT, ALKPHOS, BILITOT, PROT, ALBUMIN in the last 168 hours. No results for input(s): LIPASE, AMYLASE in the last 168 hours. No results for input(s): AMMONIA in the last 168 hours. CBC:  Recent Labs Lab 06/16/15 2226 06/16/15 2239  WBC 9.6  --   NEUTROABS 7.6  --   HGB 14.1 15.6*  HCT 43.9 46.0  MCV 90.9  --   PLT 261  --    Cardiac Enzymes:  Recent Labs Lab 06/17/15 0211 06/17/15 0801 06/17/15 1216  TROPONINI 0.03 0.04* 0.04*   BNP: BNP (last 3 results) No results for input(s): BNP in the last 8760 hours.  ProBNP (last 3 results) No results for input(s): PROBNP in the last 8760 hours.  CBG:  Recent Labs Lab 06/17/15 0643  GLUCAP 116*       Signed:  Pamella Pert  [facility] 06/17/2015, 5:43 PM

## 2015-06-17 NOTE — Consult Note (Signed)
ELECTROPHYSIOLOGY CONSULT NOTE    Patient ID: Nicole Watkins MRN: [id number], DOB/AGE: Sep 14, 1940 75 y.o.  Admit date: 06/16/2015 Date of Consult: 06/17/2015  Primary Physician: Donato Schultz, DO Primary Cardiologist: [name]  Reason for Consultation: syncope  HPI:  Nicole Watkins is a 75 y.o. female with a past medical history significant for hypertension, hyperlipidemia, coronary disease, LBBB, and GERD.  She was with her daughter and son in law on the day of admission.  She was walking down the stairs to go to dinner and became dizzy.  She leaned forward to hold onto the wall and then fell backwards.  She hit her head and her daughter states that she was "foggy" for about 3 seconds and then was back to normal.  She has a tendency to not eat or drink very much during the day but this has been a chronic issue.  She has had 2 other episodes similar to this one.  Both occurred while standing.  Both were several years ago. She also has chronic vertigo that is clearly different from the symptoms that she had yesterday.    She denies recent chest pain, palpitations, shortness of breath, fevers, chills, nausea, vomiting. She has chronic anxiety issues.  There is no family history of sudden death.   Echo 01-21-15 demonstrated EF 50-55%, HOCM, mild MR.  24 hour holter monitor 03/2015 demonstrated sinus rhythm, multifocal PVCs  Past Medical History  Diagnosis Date  . Arthritis   . Hyperlipidemia   . Hypertension   . CAD (coronary artery disease)   . Anxiety   . GERD (gastroesophageal reflux disease)   . Thyroid-related proptosis   . LBBB (left bundle branch block)      Surgical History:  Past Surgical History  Procedure Laterality Date  . No family history    . Cardiac surgery      stent placed 03/04/13     Prescriptions prior to admission  Medication Sig Dispense Refill Last Dose  . ALPRAZolam (XANAX) 0.25 MG tablet Take 1 tablet (0.25 mg total) by mouth at bedtime as  needed for anxiety. 30 tablet 1 Taking  . amLODipine (NORVASC) 5 MG tablet Take 1 tablet (5 mg total) by mouth 2 (two) times daily. 180 tablet 3 Taking  . aspirin 81 MG tablet Take 81 mg by mouth daily.   Taking  . Cholecalciferol 4000 UNITS CAPS Take 1 capsule by mouth daily.   Taking  . cyanocobalamin 2000 MCG tablet Take 2,000 mcg by mouth daily.   Taking  . docusate sodium (COLACE) 100 MG capsule Take 100 mg by mouth 2 (two) times daily.   Taking  . famotidine (PEPCID) 20 MG tablet Take 20 mg by mouth daily.   Taking  . lisinopril (PRINIVIL,ZESTRIL) 20 MG tablet Take 1 tablet (20 mg total) by mouth 2 (two) times daily. 180 tablet 3 Taking  . metoprolol tartrate (LOPRESSOR) 25 MG tablet Take 1 tablet (25 mg total) by mouth 2 (two) times daily. 180 tablet 3 Taking  . nitroGLYCERIN (NITROSTAT) 0.4 MG SL tablet Place 0.4 mg under the tongue every 5 (five) minutes as needed for chest pain. Reported on 06/17/2015   Not Taking  . pravastatin (PRAVACHOL) 40 MG tablet Take 1 tablet (40 mg total) by mouth daily. 30 tablet 6 Taking  . alendronate (FOSAMAX) 70 MG tablet Take 1 tablet (70 mg total) by mouth every 7 (seven) days. Take with a full glass of water on an empty stomach. (Patient not taking: Reported  on 06/17/2015) 4 tablet 11 Not Taking    Inpatient Medications:  . aspirin EC  81 mg Oral Daily  . docusate sodium  100 mg Oral BID  . famotidine  20 mg Oral Daily  . lisinopril  20 mg Oral BID  . metoprolol tartrate  25 mg Oral BID  . pravastatin  40 mg Oral q1800  . sodium chloride flush  3 mL Intravenous Q12H    Allergies:  Allergies  Allergen Reactions  . Lipitor [Atorvastatin] Other (See Comments)    uti  . Penicillins Anaphylaxis    Social History   Social History  . Marital Status: Widowed    Spouse Name: N/A  . Number of Children: 1  . Years of Education: N/A   Occupational History  . Not on file.   Social History Main Topics  . Smoking status: Former Games developermoker  .  Smokeless tobacco: Never Used  . Alcohol Use: No  . Drug Use: Not on file  . Sexual Activity: Not on file   Other Topics Concern  . Not on file   Social History Narrative     Family History  Problem Relation Age of Onset  . Arthritis Mother   . Hyperlipidemia Mother   . Hypertension Mother   . Hypothyroidism Mother   . Uterine cancer Maternal Grandmother      Review of Systems: All other systems reviewed and are otherwise negative except as noted above.  Physical Exam: Filed Vitals:   06/17/15 0158 06/17/15 0202 06/17/15 0205 06/17/15 1310  BP: 143/87 154/98 129/93 106/59  Pulse: 90 88 106 76  Temp: 97.8 F (36.6 C)   98.8 F (37.1 C)  TempSrc: Oral   Oral  Resp: 20   18  Weight: 124 lb 9 oz (56.5 kg)     SpO2: 97%   96%    GEN- The patient is elderly and kyphotic appearing, alert and oriented x 3 today.   HEENT: normocephalic, atraumatic; sclera clear, conjunctiva pink; hearing intact; oropharynx clear; neck supple  Lungs- Clear to ausculation bilaterally, normal work of breathing.  No wheezes, rales, rhonchi Heart- Regular rate and rhythm, 3/6 systolic murmur GI- soft, non-tender, non-distended, bowel sounds present  Extremities- no clubbing, cyanosis, or edema  MS- no significant deformity or atrophy Skin- warm and dry, no rash or lesion Psych- euthymic mood, full affect Neuro- strength and sensation are intact  Labs:   Lab Results  Component Value Date   WBC 9.6 06/16/2015   HGB 15.6* 06/16/2015   HCT 46.0 06/16/2015   MCV 90.9 06/16/2015   PLT 261 06/16/2015    Recent Labs Lab 06/16/15 2239  NA 141  K 3.9  CL 104  BUN 25*  CREATININE 0.90  GLUCOSE 119*      Radiology/Studies: Dg Chest 2 View 06/16/2015  CLINICAL DATA:  Syncope.  Fall striking head. EXAM: CHEST  2 VIEW COMPARISON:  None. FINDINGS: The lungs are hyperinflated with coarse interstitial markings. Heart is normal in size. Coronary stent versus coronary artery calcifications. Mild  tortuosity of thoracic aorta. No pulmonary edema, pleural effusion, confluent airspace disease or pneumothorax. The bones appear under mineralized. No acute fractures seen. IMPRESSION: 1. Hyperinflation with chronic lung disease. 2. Atherosclerosis.  Coronary stent versus calcifications. Electronically Signed   By: Rubye OaksMelanie  Ehinger M.D.   On: 06/16/2015 22:44   Ct Head Wo Contrast 06/16/2015  CLINICAL DATA:  Syncope, fall, possibly striking head. EXAM: CT HEAD WITHOUT CONTRAST TECHNIQUE: Contiguous axial images were  obtained from the base of the skull through the vertex without intravenous contrast. COMPARISON:  None. FINDINGS: Incidental xanthogranulomas of the choroid plexus (benign, incidental structures) in the lateral ventricles. These warrant no further workup. Small remote lacunar infarcts in the external capsules bilaterally. Otherwise, the brainstem, cerebellum, cerebral peduncles, thalami, basal ganglia, basilar cisterns, and ventricular system appear within normal limits. Periventricular white matter and corona radiata hypodensities favor chronic ischemic microvascular white matter disease. No intracranial hemorrhage, mass lesion, or acute CVA. There is atherosclerotic calcification of the cavernous carotid arteries bilaterally. IMPRESSION: 1. No acute intracranial findings. 2. 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 Electronically Signed   By: Gaylyn Rong M.D.   On: 06/16/2015 23:16    EKG: sinus rhythm, 1st degree AV block, LBBB  TELEMETRY: sinus rhythm, PVC's   Assessment/Plan: 1.  Syncope Unclear etiology History is worrisome for arrhythmic cause. With underlying conduction system disease, I think monitoring with implantable loop recorder is reasonable (the patient would prefer to avoid PPM implant if possible). Discussed with patient and daughter today who agree to proceed.  No driving x6 months (pt  aware) Encouraged adequate po hydration  2.  HOCM Recent holter without NSVT  GXT with normal BP response to exercise No family history of sudden death Recommend screening of daughter   We will follow ILR remotely. Pt to follow with Dr [name] as scheduled as she lives in [place].   Signed, Gypsy Balsam, NP 06/17/2015 2:56 PM   Seen and agree;  Abrupt onset and offset of syncope worrisome for intermittent complete heart block, and with hx of MI, possibility of VT although less likely.  Her HCM is not likely contributing but possible.    Family is disinclined to pursue pacing which would recommend based on PRESS trial, but LINQ is reasonable alternative Have reviewed with family  As above, daughter should be screened for HCM

## 2015-06-18 ENCOUNTER — Telehealth: Payer: Self-pay | Admitting: *Deleted

## 2015-06-18 ENCOUNTER — Telehealth: Payer: Self-pay | Admitting: Family Medicine

## 2015-06-18 ENCOUNTER — Encounter (HOSPITAL_COMMUNITY): Payer: Self-pay | Admitting: Internal Medicine

## 2015-06-18 ENCOUNTER — Encounter: Payer: No Typology Code available for payment source | Admitting: Family Medicine

## 2015-06-18 NOTE — Telephone Encounter (Signed)
Patient's daughter calling about LINQ implant yesterday. Gauze is saturated with old blood and patient is very sore. I have instructed the daughter to remove the outer dressing, leave steri strips in place, monitor for active bleeding. If the incision is actively bleeding- hold direct pressure over incision for 5-10 mins. If bleeding does not stop, call us back. If there is no active bleeding, redress incision and monitor for new bleeding. I advised the patient to take tylenol OTC for pain and avoid NSAIDS. She verbalizes understanding.

## 2015-06-18 NOTE — Telephone Encounter (Signed)
No charge. 

## 2015-06-18 NOTE — Telephone Encounter (Signed)
Pt will not be here today for cpe 10:30am. She was just released from the hospital yesterday after a procedure and is home resting now. She said that she does not have anyone to bring her and she is not permitted to drive. Scheduled for hospital f/u 5/1 and cpe 7/31. Charge or no charge?

## 2015-06-19 ENCOUNTER — Telehealth: Payer: Self-pay | Admitting: Cardiology

## 2015-06-19 NOTE — Telephone Encounter (Signed)
Closed Encounter  °

## 2015-06-29 ENCOUNTER — Ambulatory Visit: Payer: No Typology Code available for payment source

## 2015-06-29 ENCOUNTER — Ambulatory Visit: Payer: No Typology Code available for payment source | Admitting: Family Medicine

## 2015-07-01 ENCOUNTER — Encounter: Payer: Self-pay | Admitting: Internal Medicine

## 2015-07-01 ENCOUNTER — Ambulatory Visit (INDEPENDENT_AMBULATORY_CARE_PROVIDER_SITE_OTHER): Payer: Medicare Other | Admitting: *Deleted

## 2015-07-01 DIAGNOSIS — R55 Syncope and collapse: Secondary | ICD-10-CM

## 2015-07-01 DIAGNOSIS — Z4509 Encounter for adjustment and management of other cardiac device: Secondary | ICD-10-CM

## 2015-07-01 LAB — CUP PACEART INCLINIC DEVICE CHECK: Date Time Interrogation Session: 20170503123558

## 2015-07-01 NOTE — Progress Notes (Signed)
Loop wound check in clinic. Gauze and steri strips removed. Incision edges approximated. No redness, swelling or drainage noted. Patient instructed about wound care, activity restrictions and Carelink Monitoring. Battery status: good. R-waves 0.5875mV. 1 pause episode- 9 seconds during sleeping hours, appears to be signal dropout; JA agreed. Monthly summary reports and ROV with SK 09/29/15.

## 2015-07-02 ENCOUNTER — Encounter: Payer: Self-pay | Admitting: Internal Medicine

## 2015-07-02 ENCOUNTER — Ambulatory Visit (INDEPENDENT_AMBULATORY_CARE_PROVIDER_SITE_OTHER): Payer: Medicare Other | Admitting: Family Medicine

## 2015-07-02 ENCOUNTER — Encounter: Payer: Self-pay | Admitting: Family Medicine

## 2015-07-02 ENCOUNTER — Telehealth: Payer: Self-pay

## 2015-07-02 VITALS — BP 124/78 | HR 64 | Temp 97.8°F | Ht 64.0 in | Wt 130.0 lb

## 2015-07-02 DIAGNOSIS — E079 Disorder of thyroid, unspecified: Secondary | ICD-10-CM

## 2015-07-02 DIAGNOSIS — F411 Generalized anxiety disorder: Secondary | ICD-10-CM

## 2015-07-02 DIAGNOSIS — R55 Syncope and collapse: Secondary | ICD-10-CM

## 2015-07-02 DIAGNOSIS — Z9861 Coronary angioplasty status: Secondary | ICD-10-CM | POA: Diagnosis not present

## 2015-07-02 DIAGNOSIS — I2510 Atherosclerotic heart disease of native coronary artery without angina pectoris: Secondary | ICD-10-CM

## 2015-07-02 DIAGNOSIS — E785 Hyperlipidemia, unspecified: Secondary | ICD-10-CM

## 2015-07-02 MED ORDER — ALPRAZOLAM 0.25 MG PO TABS: 0.2500 mg | ORAL_TABLET | Freq: Every evening | ORAL | Status: DC | PRN

## 2015-07-02 NOTE — Progress Notes (Signed)
Pre visit review using our clinic review tool, if applicable. No additional management support is needed unless otherwise documented below in the visit note. 

## 2015-07-02 NOTE — Telephone Encounter (Signed)
-----   Message from Donato SchultzYvonne R Lowne Chase, DO sent at 07/02/2015  2:37 PM EDT ----- Pt would like to do cologuard

## 2015-07-02 NOTE — Patient Instructions (Signed)

## 2015-07-02 NOTE — Telephone Encounter (Signed)
Cologuard order via the Portal.    KP

## 2015-07-02 NOTE — Progress Notes (Signed)
Patient ID: Nicole Watkins, female    DOB: 05/04/1940  Age: 75 y.o. MRN: [id number]    Subjective:  Subjective HPI Nicole Watkins presents for f/u hospital d/c for syncope ---- loop recorder was placed in pt.  She has felt fine since.   Family believes she was dehydrated.     No chest pain, sob or palpitations.    Review of Systems  Constitutional: Negative for diaphoresis, appetite change, fatigue and unexpected weight change.  Eyes: Negative for pain, redness and visual disturbance.  Respiratory: Negative for cough, chest tightness, shortness of breath and wheezing.   Cardiovascular: Negative for chest pain, palpitations and leg swelling.  Endocrine: Negative for cold intolerance, heat intolerance, polydipsia, polyphagia and polyuria.  Genitourinary: Negative for dysuria, frequency and difficulty urinating.  Neurological: Negative for dizziness, light-headedness, numbness and headaches.    History Past Medical History  Diagnosis Date  . Arthritis   . Hyperlipidemia   . Hypertension   . CAD (coronary artery disease)   . Anxiety   . GERD (gastroesophageal reflux disease)   . Thyroid-related proptosis   . LBBB (left bundle branch block)     She has past surgical history that includes No family history; Cardiac surgery; and Cardiac catheterization (N/A, 06/17/2015).   Her family history includes Arthritis in her mother; Hyperlipidemia in her mother; Hypertension in her mother; Hypothyroidism in her mother; Uterine cancer in her maternal grandmother.She reports that she has quit smoking. She has never used smokeless tobacco. She reports that she does not drink alcohol. Her drug history is not on file.  Current Outpatient Prescriptions on File Prior to Visit  Medication Sig Dispense Refill  . aspirin 81 MG tablet Take 81 mg by mouth daily.    . Cholecalciferol 4000 UNITS CAPS Take 1 capsule by mouth daily.    . cyanocobalamin 2000 MCG tablet Take 2,000 mcg by mouth daily.    Marland Kitchen  docusate sodium (COLACE) 100 MG capsule Take 100 mg by mouth 2 (two) times daily.    . famotidine (PEPCID) 20 MG tablet Take 20 mg by mouth daily.    Marland Kitchen lisinopril (PRINIVIL,ZESTRIL) 20 MG tablet Take 1 tablet (20 mg total) by mouth 2 (two) times daily. 180 tablet 3  . metoprolol tartrate (LOPRESSOR) 25 MG tablet Take 1 tablet (25 mg total) by mouth 2 (two) times daily. 180 tablet 3  . nitroGLYCERIN (NITROSTAT) 0.4 MG SL tablet Place 0.4 mg under the tongue every 5 (five) minutes as needed for chest pain. Reported on 06/17/2015     No current facility-administered medications on file prior to visit.     Objective:  Objective Physical Exam  Constitutional: She is oriented to person, place, and time. She appears well-developed and well-nourished.  HENT:  Head: Normocephalic and atraumatic.  Eyes: Conjunctivae and EOM are normal.  Neck: Normal range of motion. Neck supple. No JVD present. Carotid bruit is not present. No thyromegaly present.  Cardiovascular: Normal rate and regular rhythm.   Murmur heard.  Systolic murmur is present with a grade of 3/6  Pulmonary/Chest: Effort normal and breath sounds normal. No respiratory distress. She has no wheezes. She has no rales. She exhibits no tenderness.  Musculoskeletal: She exhibits no edema.  Neurological: She is alert and oriented to person, place, and time.  Psychiatric: She has a normal mood and affect.  Nursing note and vitals reviewed.  BP 124/78 mmHg  Pulse 64  Temp(Src) 97.8 F (36.6 C) (Oral)  Ht 5\' 4"  (1.626 m)  Wt 130 lb (58.968 kg)  BMI 22.30 kg/m2  SpO2 98% Wt Readings from Last 3 Encounters:  07/02/15 130 lb (58.968 kg)  06/17/15 124 lb 9 oz (56.5 kg)  02/05/15 121 lb (54.885 kg)     Lab Results  Component Value Date   WBC 9.6 06/16/2015   HGB 15.6* 06/16/2015   HCT 46.0 06/16/2015   PLT 261 06/16/2015   GLUCOSE 119* 06/16/2015   CHOL 206* 12/15/2014   TRIG 104.0 12/15/2014   HDL 67.00 12/15/2014   LDLCALC 118*  12/15/2014   ALT 14 12/15/2014   AST 21 12/15/2014   NA 141 06/16/2015   K 3.9 06/16/2015   CL 104 06/16/2015   CREATININE 0.90 06/16/2015   BUN 25* 06/16/2015   CO2 31 12/15/2014   TSH 1.223 06/17/2015    Dg Chest 2 View  06/16/2015  CLINICAL DATA:  Syncope.  Fall striking head. EXAM: CHEST  2 VIEW COMPARISON:  None. FINDINGS: The lungs are hyperinflated with coarse interstitial markings. Heart is normal in size. Coronary stent versus coronary artery calcifications. Mild tortuosity of thoracic aorta. No pulmonary edema, pleural effusion, confluent airspace disease or pneumothorax. The bones appear under mineralized. No acute fractures seen. IMPRESSION: 1. Hyperinflation with chronic lung disease. 2. Atherosclerosis.  Coronary stent versus calcifications. Electronically Signed   By: Rubye OaksMelanie  Ehinger M.D.   On: 06/16/2015 22:44   Ct Head Wo Contrast  06/16/2015  CLINICAL DATA:  Syncope, fall, possibly striking head. EXAM: CT HEAD WITHOUT CONTRAST TECHNIQUE: Contiguous axial images were obtained from the base of the skull through the vertex without intravenous contrast. COMPARISON:  None. FINDINGS: Incidental xanthogranulomas of the choroid plexus (benign, incidental structures) in the lateral ventricles. These warrant no further workup. Small remote lacunar infarcts in the external capsules bilaterally. Otherwise, the brainstem, cerebellum, cerebral peduncles, thalami, basal ganglia, basilar cisterns, and ventricular system appear within normal limits. Periventricular white matter and corona radiata hypodensities favor chronic ischemic microvascular white matter disease. No intracranial hemorrhage, mass lesion, or acute CVA. There is atherosclerotic calcification of the cavernous carotid arteries bilaterally. IMPRESSION: 1. No acute intracranial findings. 2. 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 Electronically Signed   By:  Gaylyn RongWalter  Liebkemann M.D.   On: 06/16/2015 23:16     Assessment & Plan:  Plan I have discontinued Ms. Taglieri's alendronate. I am also having her maintain her nitroGLYCERIN, Cholecalciferol, cyanocobalamin, aspirin, docusate sodium, famotidine, metoprolol tartrate, lisinopril, ALPRAZolam, and pravastatin.  Meds ordered this encounter  Medications  . ALPRAZolam (XANAX) 0.25 MG tablet    Sig: Take 1 tablet (0.25 mg total) by mouth at bedtime as needed for anxiety.    Dispense:  30 tablet    Refill:  1  . pravastatin (PRAVACHOL) 40 MG tablet    Sig: Take 1 tablet (40 mg total) by mouth daily.    Dispense:  30 tablet    Refill:  6    Problem List Items Addressed This Visit      Unprioritized   Hyperlipidemia LDL goal <100    Con't pravastatin Check labs      Relevant Medications   pravastatin (PRAVACHOL) 40 MG tablet   Generalized anxiety disorder   Relevant Medications   ALPRAZolam (XANAX) 0.25 MG tablet   Syncope and collapse    Cardiology pending Loop recorder placed      Relevant Medications   pravastatin (PRAVACHOL) 40 MG tablet    Other Visit Diagnoses  Hyperlipidemia    -  Primary    Relevant Medications    pravastatin (PRAVACHOL) 40 MG tablet    Other Relevant Orders    Lipid panel    Comprehensive metabolic panel    Thyroid disease        Relevant Orders    Ambulatory referral to Endocrinology       Follow-up: Return if symptoms worsen or fail to improve, for as scheduled.  Donato Schultz, DO

## 2015-07-05 MED ORDER — PRAVASTATIN SODIUM 40 MG PO TABS: 40.0000 mg | ORAL_TABLET | Freq: Every day | ORAL | Status: DC

## 2015-07-05 NOTE — Assessment & Plan Note (Signed)
Cardiology pending Loop recorder placed

## 2015-07-05 NOTE — Assessment & Plan Note (Signed)
Con't pravastatin Check labs

## 2015-07-07 ENCOUNTER — Encounter: Payer: Self-pay | Admitting: Cardiology

## 2015-07-07 ENCOUNTER — Other Ambulatory Visit (INDEPENDENT_AMBULATORY_CARE_PROVIDER_SITE_OTHER): Payer: Medicare Other

## 2015-07-07 ENCOUNTER — Ambulatory Visit (INDEPENDENT_AMBULATORY_CARE_PROVIDER_SITE_OTHER): Payer: Medicare Other | Admitting: Cardiology

## 2015-07-07 VITALS — BP 180/100 | HR 68 | Ht 64.0 in | Wt 131.0 lb

## 2015-07-07 DIAGNOSIS — E785 Hyperlipidemia, unspecified: Secondary | ICD-10-CM | POA: Diagnosis not present

## 2015-07-07 DIAGNOSIS — I447 Left bundle-branch block, unspecified: Secondary | ICD-10-CM | POA: Diagnosis not present

## 2015-07-07 DIAGNOSIS — I2510 Atherosclerotic heart disease of native coronary artery without angina pectoris: Secondary | ICD-10-CM | POA: Diagnosis not present

## 2015-07-07 DIAGNOSIS — I714 Abdominal aortic aneurysm, without rupture, unspecified: Secondary | ICD-10-CM

## 2015-07-07 DIAGNOSIS — I421 Obstructive hypertrophic cardiomyopathy: Secondary | ICD-10-CM

## 2015-07-07 DIAGNOSIS — R0989 Other specified symptoms and signs involving the circulatory and respiratory systems: Secondary | ICD-10-CM

## 2015-07-07 DIAGNOSIS — F411 Generalized anxiety disorder: Secondary | ICD-10-CM

## 2015-07-07 DIAGNOSIS — I10 Essential (primary) hypertension: Secondary | ICD-10-CM

## 2015-07-07 DIAGNOSIS — R55 Syncope and collapse: Secondary | ICD-10-CM | POA: Diagnosis not present

## 2015-07-07 DIAGNOSIS — Z9861 Coronary angioplasty status: Secondary | ICD-10-CM

## 2015-07-07 LAB — COMPREHENSIVE METABOLIC PANEL
ALBUMIN: 4.4 g/dL (ref 3.5–5.2)
ALT: 12 U/L (ref 0–35)
AST: 18 U/L (ref 0–37)
Alkaline Phosphatase: 47 U/L (ref 39–117)
BILIRUBIN TOTAL: 0.5 mg/dL (ref 0.2–1.2)
BUN: 12 mg/dL (ref 6–23)
CALCIUM: 10.1 mg/dL (ref 8.4–10.5)
CHLORIDE: 101 meq/L (ref 96–112)
CO2: 31 meq/L (ref 19–32)
Creatinine, Ser: 0.85 mg/dL (ref 0.40–1.20)
GFR: 69.27 mL/min (ref >60.00)
Glucose, Bld: 107 mg/dL — ABNORMAL HIGH (ref 70–99)
Potassium: 4.3 mEq/L (ref 3.5–5.1)
Sodium: 139 mEq/L (ref 135–145)
Total Protein: 7.4 g/dL (ref 6.0–8.3)

## 2015-07-07 LAB — LIPID PANEL
CHOL/HDL RATIO: 3
Cholesterol: 204 mg/dL — ABNORMAL HIGH (ref 0–200)
HDL: 61.3 mg/dL (ref >39.00)
LDL CALC: 125 mg/dL — AB (ref 0–99)
NonHDL: 142.95
TRIGLYCERIDES: 89 mg/dL (ref 0.0–149.0)
VLDL: 17.8 mg/dL (ref 0.0–40.0)

## 2015-07-07 NOTE — Patient Instructions (Signed)
Medication Instructions:  Take Amlodipine 2.5 mg twice a day  Labwork: NONE  Testing/Procedures: NONE  Follow-[place] [facility] [place] available   Any Other Special Instructions Will Be Listed Below (If Applicable).   If you need a refill on your cardiac medications before your next appointment, please call your pharmacy.

## 2015-07-07 NOTE — Assessment & Plan Note (Signed)
Situational stress

## 2015-07-07 NOTE — Assessment & Plan Note (Signed)
S/P loop recorder implant 06/17/15 Episode possible secondary to dehydration and HOCM-no further syncope or pre syncope

## 2015-07-07 NOTE — Assessment & Plan Note (Addendum)
Treadmill stopped Jan 2017 secondary to LBBB Dr Katrinka BlazingSmith had mentioned getting a Lexiscan at some point- pt denies chest pain

## 2015-07-07 NOTE — Assessment & Plan Note (Signed)
Chronic. 

## 2015-07-07 NOTE — Progress Notes (Signed)
07/07/2015 Valene BorsJuliette Hindes   13-Feb-1941  [id number]  Primary Physician Donato SchultzYvonne R Lowne Chase, DO Primary Cardiologist: Dr Jens Somrenshaw [facility]  HPI:  75 y/[facility]. Patient suffered a NSTEMI in January 2015. She had a DES to the Lcx. She also carries a diagnosis of PSVT and LBBB. Echo in November 2016 showed normal LV function, severe focal basal septal hypertrophy with an LVOT gradient of 36 mmHg consistent with HOCM. She was admitted 06/16/15 after a syncopal episode which appears to be secondary to dehydration and HOCM. Loop recorder implanted. Pt here for follow up. No further syncope but B/P has been running high since Amlodipine was stopped.    Current Outpatient Prescriptions  Medication Sig Dispense Refill  . ALPRAZolam (XANAX) 0.25 MG tablet Take 1 tablet (0.25 mg total) by mouth at bedtime as needed for anxiety. 30 tablet 1  . aspirin 81 MG tablet Take 81 mg by mouth daily.    . Cholecalciferol 4000 UNITS CAPS Take 1 capsule by mouth daily.    . cyanocobalamin 2000 MCG tablet Take 2,000 mcg by mouth daily.    Marland Kitchen. docusate sodium (COLACE) 100 MG capsule Take 100 mg by mouth 2 (two) times daily.    . famotidine (PEPCID) 20 MG tablet Take 20 mg by mouth daily.    Marland Kitchen. lisinopril (PRINIVIL,ZESTRIL) 20 MG tablet Take 1 tablet (20 mg total) by mouth 2 (two) times daily. 180 tablet 3  . metoprolol tartrate (LOPRESSOR) 25 MG tablet Take 1 tablet (25 mg total) by mouth 2 (two) times daily. 180 tablet 3  . nitroGLYCERIN (NITROSTAT) 0.4 MG SL tablet Place 0.4 mg under the tongue every 5 (five) minutes as needed for chest pain. Reported on 06/17/2015    . pravastatin (PRAVACHOL) 40 MG tablet Take 1 tablet (40 mg total) by mouth daily. 30 tablet 6   No current facility-administered medications for this visit.    Allergies  Allergen Reactions  . Lipitor [Atorvastatin] Other (See Comments)    uti  . Penicillins Anaphylaxis    Social History   Social  History  . Marital Status: Widowed    Spouse Name: N/A  . Number of Children: 1  . Years of Education: N/A   Occupational History  . Not on file.   Social History Main Topics  . Smoking status: Former Games developermoker  . Smokeless tobacco: Never Used  . Alcohol Use: No  . Drug Use: Not on file  . Sexual Activity: Not on file   Other Topics Concern  . Not on file   Social History Narrative     Review of Systems: General: negative for chills, fever, night sweats or weight changes.  Cardiovascular: negative for chest pain, dyspnea on exertion, edema, orthopnea, palpitations, paroxysmal nocturnal dyspnea or shortness of breath Dermatological: negative for rash Respiratory: negative for cough or wheezing Urologic: negative for hematuria Abdominal: negative for nausea, vomiting, diarrhea, bright red blood per rectum, melena, or hematemesis Neurologic: negative for visual changes, syncope, or dizziness All other systems reviewed and are otherwise negative except as noted above.    Blood pressure 180/100, pulse 68, height 5\' 4"  (1.626 m), weight 131 lb (59.421 kg).  General appearance: alert, cooperative and no distress Neck: no carotid bruit and no JVD Lungs: clear to auscultation bilaterally and kyphosis Heart: regular rate and rhythm and 2/6 systolic murmur Neurologic: Grossly normal   ASSESSMENT AND PLAN:   Syncope and collapse S/P loop recorder implant 06/17/15 Episode  possible secondary to dehydration and HOCM-no further syncope or pre syncope  HTN (hypertension) History of labile HTN. She was hypotensive in the hospital, now hypertensive-182/84  CAD S/P CFX PCI Jan 2015 Treadmill stopped Jan 2017 secondary to LBBB Dr Katrinka Blazing had mentioned getting a Lexiscan at some point- pt denies chest pain  Generalized anxiety disorder Situational stress  LBBB (left bundle branch block) Chronic   PLAN  Add back lower dose Amlodipine (pt prefers to take her medications BID-Amlodipine  2.5 mg BID). She would like to f/u with Dr [name] in [facility] and this will be arranged. I will defer question of Lexiscan to Dr [name].  Corine Shelter K PA-C 07/07/2015 11:44 AM

## 2015-07-07 NOTE — Assessment & Plan Note (Signed)
History of labile HTN. She was hypotensive in the hospital, now hypertensive-182/84

## 2015-07-17 ENCOUNTER — Ambulatory Visit (INDEPENDENT_AMBULATORY_CARE_PROVIDER_SITE_OTHER): Payer: Medicare Other | Admitting: *Deleted

## 2015-07-17 DIAGNOSIS — R55 Syncope and collapse: Secondary | ICD-10-CM

## 2015-07-22 ENCOUNTER — Telehealth: Payer: Self-pay | Admitting: Cardiology

## 2015-07-22 ENCOUNTER — Ambulatory Visit (INDEPENDENT_AMBULATORY_CARE_PROVIDER_SITE_OTHER): Payer: Medicare Other | Admitting: Cardiology

## 2015-07-22 ENCOUNTER — Encounter: Payer: Self-pay | Admitting: Cardiology

## 2015-07-22 VITALS — BP 176/88 | HR 62 | Ht 64.0 in | Wt 131.4 lb

## 2015-07-22 DIAGNOSIS — I714 Abdominal aortic aneurysm, without rupture, unspecified: Secondary | ICD-10-CM

## 2015-07-22 DIAGNOSIS — I2510 Atherosclerotic heart disease of native coronary artery without angina pectoris: Secondary | ICD-10-CM

## 2015-07-22 DIAGNOSIS — E785 Hyperlipidemia, unspecified: Secondary | ICD-10-CM

## 2015-07-22 DIAGNOSIS — I10 Essential (primary) hypertension: Secondary | ICD-10-CM

## 2015-07-22 DIAGNOSIS — I421 Obstructive hypertrophic cardiomyopathy: Secondary | ICD-10-CM

## 2015-07-22 DIAGNOSIS — R55 Syncope and collapse: Secondary | ICD-10-CM

## 2015-07-22 DIAGNOSIS — Z9861 Coronary angioplasty status: Secondary | ICD-10-CM

## 2015-07-22 MED ORDER — ROSUVASTATIN CALCIUM 20 MG PO TABS: 20.0000 mg | ORAL_TABLET | Freq: Every day | ORAL | Status: DC

## 2015-07-22 NOTE — Assessment & Plan Note (Addendum)
Continue low-dose beta blocker. Previous treadmill showed blood pressure improved with exercise. Previous monitor did not show nonsustained ventricular tachycardia. No Family history of sudden death. Plan follow-up echoes in the future. I explained to her previously that her daughter should be screened.

## 2015-07-22 NOTE — Progress Notes (Signed)
Carelink Summary Report / Loop Recorder 

## 2015-07-22 NOTE — Telephone Encounter (Signed)
Spoke with pt, new script for rosuvastatin 20 mg sent to the [facility]. Patient voiced understanding to stop pravastatin and call if develops symtoms to the crestor. Lab orders mailed to the pt for fasting lab work in 4 weeks.

## 2015-07-22 NOTE — Assessment & Plan Note (Signed)
Follow-up ultrasound December2021.

## 2015-07-22 NOTE — Progress Notes (Signed)
HPI: FU coronary artery disease. Previously [facility]. Patient suffered a non-ST elevation myocardial infarction in January 2015. She had a drug-eluting stent to the Lcx but I do not have details available. She also carries a diagnosis of supraventricular tachycardia and left bundle branch block. Echocardiogram November 2016 showed normal LV function, severe focal basal septal hypertrophy with an outflow tract gradient of 36 mmHg consistent with hypertrophic obstructive cardiomyopathy; SAM. Mild aortic insufficiency and mitral regurgitation. Mild left atrial enlargement ventricular enlargement. Carotid Dopplers December 2016 showed no hemodynamically significant stenosis. Abdominal ultrasound December 2016 showed mild abdominal aortic ectasia at 2.8 cm. Follow-up recommended 5 years. She has seen psychiatry for problems with stress related to her previous myocardial infarction and loss of her husband. Monitor January 2017 showed sinus with PACs, PVCs, couplets, nonconducted PACs and one dropped P-wave early a.m. Exercise treadmill January 2017 showed normal blood pressure response to exercise. Patient had syncope in April 2017; Seen by electrophysiology and concern for intermittent complete heart block given baseline left bundle branch block and VT with history of MI. Implantable loop was inserted. Nuclear study planned but not performed. Since last seen, Patient denies dyspnea, chest pain, palpitations or syncope. No pedal edema.  Current Outpatient Prescriptions  Medication Sig Dispense Refill  . ALPRAZolam (XANAX) 0.25 MG tablet Take 1 tablet (0.25 mg total) by mouth at bedtime as needed for anxiety. 30 tablet 1  . aspirin 81 MG tablet Take 81 mg by mouth daily.    . Cholecalciferol 4000 UNITS CAPS Take 1 capsule by mouth daily.    . cyanocobalamin 2000 MCG tablet Take 1,000 mcg by mouth daily.     Marland Kitchen. docusate sodium (COLACE) 100 MG capsule Take 100 mg by mouth 2 (two) times  daily.    . famotidine (PEPCID) 20 MG tablet Take 20 mg by mouth daily.    Marland Kitchen. lisinopril (PRINIVIL,ZESTRIL) 20 MG tablet Take 1 tablet (20 mg total) by mouth 2 (two) times daily. 180 tablet 3  . metoprolol tartrate (LOPRESSOR) 25 MG tablet Take 1 tablet (25 mg total) by mouth 2 (two) times daily. 180 tablet 3  . nitroGLYCERIN (NITROSTAT) 0.4 MG SL tablet Place 0.4 mg under the tongue every 5 (five) minutes as needed for chest pain. Reported on 06/17/2015    . pravastatin (PRAVACHOL) 40 MG tablet Take 1 tablet (40 mg total) by mouth daily. 30 tablet 6   No current facility-administered medications for this visit.     Past Medical History  Diagnosis Date  . Arthritis   . Hyperlipidemia   . Hypertension   . CAD (coronary artery disease)   . Anxiety   . GERD (gastroesophageal reflux disease)   . Thyroid-related proptosis   . LBBB (left bundle branch block)     Past Surgical History  Procedure Laterality Date  . No family history    . Cardiac surgery      stent placed 03/04/13  . Ep implantable device N/A 06/17/2015    Procedure: Loop Recorder Insertion;  Surgeon: Duke SalviaSteven C Klein, MD;  [facility];  Service: Cardiovascular;  Laterality: N/A;    Social History   Social History  . Marital Status: Widowed    Spouse Name: N/A  . Number of Children: 1  . Years of Education: N/A   Occupational History  . Not on file.   Social History Main Topics  . Smoking status: Former Games developermoker  . Smokeless tobacco: Never Used  .  Alcohol Use: No  . Drug Use: Not on file  . Sexual Activity: Not on file   Other Topics Concern  . Not on file   Social History Narrative    Family History  Problem Relation Age of Onset  . Arthritis Mother   . Hyperlipidemia Mother   . Hypertension Mother   . Hypothyroidism Mother   . Uterine cancer Maternal Grandmother     ROS: no fevers or chills, productive cough, hemoptysis, dysphasia, odynophagia, melena, hematochezia, dysuria,  hematuria, rash, seizure activity, orthopnea, PND, pedal edema, claudication. Remaining systems are negative.  Physical Exam: Well-developed well-nourished in no acute distress.  Skin is warm and dry.  HEENT is normal.  Neck is supple.  Chest Diminished breath sounds throughout Cardiovascular exam is regular rate and rhythm. 2/6 systolic murmur left sternal border that increases with Valsalva. Abdominal exam nontender or distended. No masses palpated. Extremities show no edema. neuro grossly intact

## 2015-07-22 NOTE — Assessment & Plan Note (Signed)
Patient has not had any recurrent syncope. Etiology unclear.Implantable loop monitor is in place and she will follow-up with Dr. Graciela HusbandsKlein for interrogation. I explained that she should not drive for 6 months following her syncopal episode. She was not happy with this and states she feels this is excessive. I explained the risk to her and other drivers if she had syncope while driving. This did not seem to satisfy her.

## 2015-07-22 NOTE — Assessment & Plan Note (Signed)
Blood pressure is elevated but she states it is controlled for the most part at home. We will continue present medications and follow.

## 2015-07-22 NOTE — Telephone Encounter (Signed)
Pt called in stating that she wants to move forward with taking that Crestor medication and she needs this called in to the [facility] on [address] in [place]. Please f/u with her.  Thanks

## 2015-07-22 NOTE — Assessment & Plan Note (Signed)
Recent LDL was not at goal. We discussed options of changing Pravachol to Crestor today. She did not tolerate Lipitor and states a pharmacist told her Crestor would cause the same side effects. I explained that sometimes patients will tolerate one statin and not another. However she declined changing to Crestor.

## 2015-07-22 NOTE — Assessment & Plan Note (Signed)
Continue aspirin and statin. I recommended a Lexiscan nuclear study to screen for ischemia given recent syncope. However she states her daughter who is a nurse has issues with this test. She will contact us if she is agreeable. She does not want to proceed at present.

## 2015-07-22 NOTE — Patient Instructions (Signed)
Your physician recommends that you schedule a follow-up appointment in: 3 MONTHS WITH DR CRENSHAW  

## 2015-07-23 ENCOUNTER — Encounter: Payer: Self-pay | Admitting: Cardiology

## 2015-07-23 ENCOUNTER — Telehealth: Payer: Self-pay

## 2015-07-23 ENCOUNTER — Telehealth: Payer: Self-pay | Admitting: Internal Medicine

## 2015-07-23 MED ORDER — ATORVASTATIN CALCIUM 20 MG PO TABS: 20.0000 mg | ORAL_TABLET | Freq: Every day | ORAL | Status: DC

## 2015-07-23 NOTE — Telephone Encounter (Signed)
Pt calling re medication-she says she has complete info for you now-pls call

## 2015-07-23 NOTE — Telephone Encounter (Signed)
PATIENT STATES SHE SPOKE DEBRA AND QUESTION WERE ANSWERED

## 2015-07-23 NOTE — Telephone Encounter (Signed)
Patient called in and stated the Crestor to expensive ($50 copay) and had a reaction to Lipitor wants something cheaper. Advised patient that the nurse would give her a call back once she discusses this matter with Dr Jens Somrenshaw.

## 2015-07-23 NOTE — Telephone Encounter (Signed)
This encounter was created in error - please disregard.

## 2015-07-23 NOTE — Telephone Encounter (Signed)
New message       The pt is having issues, the pt went on My chart as saw where there was scheduling issues and the pt had missed a appointment the pt was unaware of it and the pt states she not understanding some of the stuff on "My Chart".

## 2015-07-23 NOTE — Telephone Encounter (Signed)
Change pravachol to 80 mg daily; lipids and liver in six weeks Olga MillersBrian Kaci Dillie

## 2015-07-23 NOTE — Telephone Encounter (Signed)
Spoke with pt, she wants to try taking the lipitor again. Her symptoms previously were on the 80 mg dose and it was urinary symptoms with irritation and blisters. We will start at the 20 mg dose of the lipitor and she will call if unable to tolerate. Lab orders mailed to the pt  Dr Jens Somcrenshaw aware of above.

## 2015-08-05 ENCOUNTER — Telehealth: Payer: Self-pay | Admitting: Internal Medicine

## 2015-08-05 NOTE — Telephone Encounter (Signed)
New Message:  Please call,says she has an appointment in my chart with your name on it. She says she does not know what it is for.

## 2015-08-06 NOTE — Telephone Encounter (Signed)
Spoke with Ms. Nicole Watkins about monthly summary reports being automatic. As Nicole Watkins as she is sleeping by her monitor nightly and there is connectivity (which we monitor), there is nothing she needs to do manually. She verbalizes understanding.

## 2015-08-10 ENCOUNTER — Encounter: Payer: Self-pay | Admitting: Family Medicine

## 2015-08-10 ENCOUNTER — Ambulatory Visit (INDEPENDENT_AMBULATORY_CARE_PROVIDER_SITE_OTHER): Payer: Medicare Other | Admitting: Family Medicine

## 2015-08-10 VITALS — BP 132/78 | HR 64 | Temp 98.4°F | Ht 64.0 in | Wt 130.8 lb

## 2015-08-10 DIAGNOSIS — Z0000 Encounter for general adult medical examination without abnormal findings: Secondary | ICD-10-CM | POA: Diagnosis not present

## 2015-08-10 NOTE — Progress Notes (Signed)
Subjective:   Nicole Watkins is a 75 y.o. female who presents for Medicare Annual (Subsequent) preventive examination.  Review of Systems:  Review of Systems  Constitutional: Negative for activity change, appetite change and fatigue.  HENT: Negative for hearing loss, congestion, tinnitus and ear discharge.  dentist q80m Eyes: Negative for visual disturbance (see optho q1y -- vision corrected to 20/20 with glasses).  Respiratory: Negative for cough, chest tightness and shortness of breath.   Cardiovascular: Negative for chest pain, palpitations and leg swelling.  Gastrointestinal: Negative for abdominal pain, diarrhea, constipation and abdominal distention.  Genitourinary: Negative for urgency, frequency, decreased urine volume and difficulty urinating.  Musculoskeletal: Negative for back pain, arthralgias and gait problem.  Skin: Negative for color change, pallor and rash.  Neurological: Negative for dizziness, light-headedness, numbness and headaches.  Hematological: Negative for adenopathy. Does not bruise/bleed easily.  Psychiatric/Behavioral: Negative for suicidal ideas, confusion, sleep disturbance, self-injury, dysphoric mood, decreased concentration and agitation.             Objective:     Vitals: BP 132/78 mmHg  Pulse 64  Temp(Src) 98.4 F (36.9 C) (Oral)  Ht  (1.626 m)  Wt 130 lb 12.8 oz (59.33 kg)  BMI 22.44 kg/m2  SpO2 97%  Body mass index is 22.44 kg/(m^2). BP 132/78 mmHg  Pulse 64  Temp(Src) 98.4 F (36.9 C) (Oral)  Ht  (1.626 m)  Wt 130 lb 12.8 oz (59.33 kg)  BMI 22.44 kg/m2  SpO2 97% General appearance: alert, cooperative, appears stated age and no distress Neck: no adenopathy, no carotid bruit, no JVD, supple, symmetrical, trachea midline and thyroid not enlarged, symmetric, no tenderness/mass/nodules Lungs: clear to auscultation bilaterally Breasts: normal appearance, no masses or tenderness Heart: regular rate and rhythm, S1, S2 normal,  no murmur, click, rub or gallop Abdomen: soft, non-tender; bowel sounds normal; no masses,  no organomegaly Extremities: extremities normal, atraumatic, no cyanosis or edema Tobacco History  Smoking status  . Former Smoker  Smokeless tobacco  . Never Used     Counseling given: Not Answered   Past Medical History  Diagnosis Date  . Arthritis   . Hyperlipidemia   . Hypertension   . CAD (coronary artery disease)   . Anxiety   . GERD (gastroesophageal reflux disease)   . Thyroid-related proptosis   . LBBB (left bundle branch block)    Past Surgical History  Procedure Laterality Date  . No family history    . Cardiac surgery      stent placed 03/04/13  . Ep implantable device N/A 06/17/2015    Procedure: Loop Recorder Insertion;  Surgeon: [name], MD;  Location: [facility] INVASIVE CV LAB;  Service: Cardiovascular;  Laterality: N/A;   Family History  Problem Relation Age of Onset  . Arthritis Mother   . Hyperlipidemia Mother   . Hypertension Mother   . Hypothyroidism Mother   . Uterine cancer Maternal Grandmother    History  Sexual Activity  . Sexual Activity: Not on file    Outpatient Encounter Prescriptions as of 08/10/2015  Medication Sig  . ALPRAZolam (XANAX) 0.25 MG tablet Take 1 tablet (0.25 mg total) by mouth at bedtime as needed for anxiety.  Marland Kitchen aspirin 81 MG tablet Take 81 mg by mouth daily. Reported on 08/10/2015  . atorvastatin (LIPITOR) 20 MG tablet Take 1 tablet (20 mg total) by mouth daily.  . Cholecalciferol 4000 UNITS CAPS Take 1 capsule by mouth daily. Reported on 08/10/2015  . cyanocobalamin  2000 MCG tablet Take 1,000 mcg by mouth daily. Reported on 08/10/2015  . docusate sodium (COLACE) 100 MG capsule Take 100 mg by mouth 2 (two) times daily. Reported on 08/10/2015  . famotidine (PEPCID) 20 MG tablet Take 20 mg by mouth daily. Reported on 08/10/2015  . lisinopril (PRINIVIL,ZESTRIL) 20 MG tablet Take 1 tablet (20 mg total) by mouth 2 (two) times daily.  .  metoprolol tartrate (LOPRESSOR) 25 MG tablet Take 1 tablet (25 mg total) by mouth 2 (two) times daily.  . nitroGLYCERIN (NITROSTAT) 0.4 MG SL tablet Place 0.4 mg under the tongue every 5 (five) minutes as needed for chest pain. Reported on 08/10/2015   No facility-administered encounter medications on file as of 08/10/2015.    Activities of Daily Living In your present state of health, do you have any difficulty performing the following activities: 08/10/2015 12/15/2014  Hearing? N N  Vision? N N  Difficulty concentrating or making decisions? N N  Walking or climbing stairs? N N  Dressing or bathing? N N  Doing errands, shopping? N N    Patient Care Team: Donato SchultzYvonne R Lowne Chase, DO as PCP - General (Family Medicine) Lewayne BuntingBrian S Crenshaw, MD as Consulting Physician (Cardiology)    Assessment:    CPE  Exercise Activities and Dietary recommendations Current Exercise Habits: The patient does not participate in regular exercise at present, Exercise limited by: cardiac condition(s)  Goals    None     Fall Risk Fall Risk  08/10/2015 12/15/2014  Falls in the past year? No No   Depression Screen PHQ 2/9 Scores 08/10/2015 12/15/2014  PHQ - 2 Score 1 0     Cognitive Testing mmse 30/30   There is no immunization history on file for this patient. Screening Tests Health Maintenance  Topic Date Due  . INFLUENZA VACCINE  12/15/2015 (Originally 09/29/2015)  . COLONOSCOPY  07/01/2016 (Originally 05/16/1990)  . ZOSTAVAX  07/01/2016 (Originally 05/15/2000)  . TETANUS/TDAP  07/01/2016 (Originally 05/16/1959)  . PNA vac Low Risk Adult (1 of 2 - PCV13) 07/01/2016 (Originally 05/15/2005)  . DEXA SCAN  Completed      Plan:    see AVS During the course of the visit the patient was educated and counseled about the following appropriate screening and preventive services:   Vaccines to include Pneumoccal, Influenza, Hepatitis B, Td, Zostavax, HCV  Electrocardiogram  Cardiovascular  Disease  Colorectal cancer screening  Bone density screening  Diabetes screening  Glaucoma screening  Mammography/PAP  Nutrition counseling   Patient Instructions (the written plan) was given to the patient.    1. Medicare annual wellness visit, subsequent  See above  2. Routine history and physical examination of adult    Donato SchultzYvonne R Lowne Chase, DO  08/10/2015

## 2015-08-10 NOTE — Progress Notes (Signed)
Pre visit review using our clinic review tool, if applicable. No additional management support is needed unless otherwise documented below in the visit note. 

## 2015-08-10 NOTE — Patient Instructions (Signed)
Preventive Care for Adults, Female A healthy lifestyle and preventive care can promote health and wellness. Preventive health guidelines for women include the following key practices.  A routine yearly physical is a good way to check with your health care provider about your health and preventive screening. It is a chance to share any concerns and updates on your health and to receive a thorough exam.  Visit your dentist for a routine exam and preventive care every 6 months. Brush your teeth twice a day and floss once a day. Good oral hygiene prevents tooth decay and gum disease.  The frequency of eye exams is based on your age, health, family medical history, use of contact lenses, and other factors. Follow your health care provider's recommendations for frequency of eye exams.  Eat a healthy diet. Foods like vegetables, fruits, whole grains, low-fat dairy products, and lean protein foods contain the nutrients you need without too many calories. Decrease your intake of foods high in solid fats, added sugars, and salt. Eat the right amount of calories for you.Get information about a proper diet from your health care provider, if necessary.  Regular physical exercise is one of the most important things you can do for your health. Most adults should get at least 150 minutes of moderate-intensity exercise (any activity that increases your heart rate and causes you to sweat) each week. In addition, most adults need muscle-strengthening exercises on 2 or more days a week.  Maintain a healthy weight. The body mass index (BMI) is a screening tool to identify possible weight problems. It provides an estimate of body fat based on height and weight. Your health care provider can find your BMI and can help you achieve or maintain a healthy weight.For adults 20 years and older:  A BMI below 18.5 is considered underweight.  A BMI of 18.5 to 24.9 is normal.  A BMI of 25 to 29.9 is considered overweight.  A  BMI of 30 and above is considered obese.  Maintain normal blood lipids and cholesterol levels by exercising and minimizing your intake of saturated fat. Eat a balanced diet with plenty of fruit and vegetables. Blood tests for lipids and cholesterol should begin at age 45 and be repeated every 5 years. If your lipid or cholesterol levels are high, you are over 50, or you are at high risk for heart disease, you may need your cholesterol levels checked more frequently.Ongoing high lipid and cholesterol levels should be treated with medicines if diet and exercise are not working.  If you smoke, find out from your health care provider how to quit. If you do not use tobacco, do not start.  Lung cancer screening is recommended for adults aged 45-80 years who are at high risk for developing lung cancer because of a history of smoking. A yearly low-dose CT scan of the lungs is recommended for people who have at least a 30-pack-year history of smoking and are a current smoker or have quit within the past 15 years. A pack year of smoking is smoking an average of 1 pack of cigarettes a day for 1 year (for example: 1 pack a day for 30 years or 2 packs a day for 15 years). Yearly screening should continue until the smoker has stopped smoking for at least 15 years. Yearly screening should be stopped for people who develop a health problem that would prevent them from having lung cancer treatment.  If you are pregnant, do not drink alcohol. If you are  breastfeeding, be very cautious about drinking alcohol. If you are not pregnant and choose to drink alcohol, do not have more than 1 drink per day. One drink is considered to be 12 ounces (355 mL) of beer, 5 ounces (148 mL) of wine, or 1.5 ounces (44 mL) of liquor.  Avoid use of street drugs. Do not share needles with anyone. Ask for help if you need support or instructions about stopping the use of drugs.  High blood pressure causes heart disease and increases the risk  of stroke. Your blood pressure should be checked at least every 1 to 2 years. Ongoing high blood pressure should be treated with medicines if weight loss and exercise do not work.  If you are 55-79 years old, ask your health care provider if you should take aspirin to prevent strokes.  Diabetes screening is done by taking a blood sample to check your blood glucose level after you have not eaten for a certain period of time (fasting). If you are not overweight and you do not have risk factors for diabetes, you should be screened once every 3 years starting at age 45. If you are overweight or obese and you are 40-70 years of age, you should be screened for diabetes every year as part of your cardiovascular risk assessment.  Breast cancer screening is essential preventive care for women. You should practice "breast self-awareness." This means understanding the normal appearance and feel of your breasts and may include breast self-examination. Any changes detected, no matter how small, should be reported to a health care provider. Women in their 20s and 30s should have a clinical breast exam (CBE) by a health care provider as part of a regular health exam every 1 to 3 years. After age 40, women should have a CBE every year. Starting at age 40, women should consider having a mammogram (breast X-ray test) every year. Women who have a family history of breast cancer should talk to their health care provider about genetic screening. Women at a high risk of breast cancer should talk to their health care providers about having an MRI and a mammogram every year.  Breast cancer gene (BRCA)-related cancer risk assessment is recommended for women who have family members with BRCA-related cancers. BRCA-related cancers include breast, ovarian, tubal, and peritoneal cancers. Having family members with these cancers may be associated with an increased risk for harmful changes (mutations) in the breast cancer genes BRCA1 and  BRCA2. Results of the assessment will determine the need for genetic counseling and BRCA1 and BRCA2 testing.  Your health care provider may recommend that you be screened regularly for cancer of the pelvic organs (ovaries, uterus, and vagina). This screening involves a pelvic examination, including checking for microscopic changes to the surface of your cervix (Pap test). You may be encouraged to have this screening done every 3 years, beginning at age 21.  For women ages 30-65, health care providers may recommend pelvic exams and Pap testing every 3 years, or they may recommend the Pap and pelvic exam, combined with testing for human papilloma virus (HPV), every 5 years. Some types of HPV increase your risk of cervical cancer. Testing for HPV may also be done on women of any age with unclear Pap test results.  Other health care providers may not recommend any screening for nonpregnant women who are considered low risk for pelvic cancer and who do not have symptoms. Ask your health care provider if a screening pelvic exam is right for   you.  If you have had past treatment for cervical cancer or a condition that could lead to cancer, you need Pap tests and screening for cancer for at least 20 years after your treatment. If Pap tests have been discontinued, your risk factors (such as having a new sexual partner) need to be reassessed to determine if screening should resume. Some women have medical problems that increase the chance of getting cervical cancer. In these cases, your health care provider may recommend more frequent screening and Pap tests.  Colorectal cancer can be detected and often prevented. Most routine colorectal cancer screening begins at the age of 50 years and continues through age 75 years. However, your health care provider may recommend screening at an earlier age if you have risk factors for colon cancer. On a yearly basis, your health care provider may provide home test kits to check  for hidden blood in the stool. Use of a small camera at the end of a tube, to directly examine the colon (sigmoidoscopy or colonoscopy), can detect the earliest forms of colorectal cancer. Talk to your health care provider about this at age 50, when routine screening begins. Direct exam of the colon should be repeated every 5-10 years through age 75 years, unless early forms of precancerous polyps or small growths are found.  People who are at an increased risk for hepatitis B should be screened for this virus. You are considered at high risk for hepatitis B if:  You were born in a country where hepatitis B occurs often. Talk with your health care provider about which countries are considered high risk.  Your parents were born in a high-risk country and you have not received a shot to protect against hepatitis B (hepatitis B vaccine).  You have HIV or AIDS.  You use needles to inject street drugs.  You live with, or have sex with, someone who has hepatitis B.  You get hemodialysis treatment.  You take certain medicines for conditions like cancer, organ transplantation, and autoimmune conditions.  Hepatitis C blood testing is recommended for all people born from 1945 through 1965 and any individual with known risks for hepatitis C.  Practice safe sex. Use condoms and avoid high-risk sexual practices to reduce the spread of sexually transmitted infections (STIs). STIs include gonorrhea, chlamydia, syphilis, trichomonas, herpes, HPV, and human immunodeficiency virus (HIV). Herpes, HIV, and HPV are viral illnesses that have no cure. They can result in disability, cancer, and death.  You should be screened for sexually transmitted illnesses (STIs) including gonorrhea and chlamydia if:  You are sexually active and are younger than 24 years.  You are older than 24 years and your health care provider tells you that you are at risk for this type of infection.  Your sexual activity has changed  since you were last screened and you are at an increased risk for chlamydia or gonorrhea. Ask your health care provider if you are at risk.  If you are at risk of being infected with HIV, it is recommended that you take a prescription medicine daily to prevent HIV infection. This is called preexposure prophylaxis (PrEP). You are considered at risk if:  You are sexually active and do not regularly use condoms or know the HIV status of your partner(s).  You take drugs by injection.  You are sexually active with a partner who has HIV.  Talk with your health care provider about whether you are at high risk of being infected with HIV. If   you choose to begin PrEP, you should first be tested for HIV. You should then be tested every 3 months for as long as you are taking PrEP.  Osteoporosis is a disease in which the bones lose minerals and strength with aging. This can result in serious bone fractures or breaks. The risk of osteoporosis can be identified using a bone density scan. Women ages 67 years and over and women at risk for fractures or osteoporosis should discuss screening with their health care providers. Ask your health care provider whether you should take a calcium supplement or vitamin D to reduce the rate of osteoporosis.  Menopause can be associated with physical symptoms and risks. Hormone replacement therapy is available to decrease symptoms and risks. You should talk to your health care provider about whether hormone replacement therapy is right for you.  Use sunscreen. Apply sunscreen liberally and repeatedly throughout the day. You should seek shade when your shadow is shorter than you. Protect yourself by wearing long sleeves, pants, a wide-brimmed hat, and sunglasses year round, whenever you are outdoors.  Once a month, do a whole body skin exam, using a mirror to look at the skin on your back. Tell your health care provider of new moles, moles that have irregular borders, moles that  are larger than a pencil eraser, or moles that have changed in shape or color.  Stay current with required vaccines (immunizations).  Influenza vaccine. All adults should be immunized every year.  Tetanus, diphtheria, and acellular pertussis (Td, Tdap) vaccine. Pregnant women should receive 1 dose of Tdap vaccine during each pregnancy. The dose should be obtained regardless of the length of time since the last dose. Immunization is preferred during the 27th-36th week of gestation. An adult who has not previously received Tdap or who does not know her vaccine status should receive 1 dose of Tdap. This initial dose should be followed by tetanus and diphtheria toxoids (Td) booster doses every 10 years. Adults with an unknown or incomplete history of completing a 3-dose immunization series with Td-containing vaccines should begin or complete a primary immunization series including a Tdap dose. Adults should receive a Td booster every 10 years.  Varicella vaccine. An adult without evidence of immunity to varicella should receive 2 doses or a second dose if she has previously received 1 dose. Pregnant females who do not have evidence of immunity should receive the first dose after pregnancy. This first dose should be obtained before leaving the health care facility. The second dose should be obtained 4-8 weeks after the first dose.  Human papillomavirus (HPV) vaccine. Females aged 13-26 years who have not received the vaccine previously should obtain the 3-dose series. The vaccine is not recommended for use in pregnant females. However, pregnancy testing is not needed before receiving a dose. If a female is found to be pregnant after receiving a dose, no treatment is needed. In that case, the remaining doses should be delayed until after the pregnancy. Immunization is recommended for any person with an immunocompromised condition through the age of 61 years if she did not get any or all doses earlier. During the  3-dose series, the second dose should be obtained 4-8 weeks after the first dose. The third dose should be obtained 24 weeks after the first dose and 16 weeks after the second dose.  Zoster vaccine. One dose is recommended for adults aged 30 years or older unless certain conditions are present.  Measles, mumps, and rubella (MMR) vaccine. Adults born  before 1957 generally are considered immune to measles and mumps. Adults born in 1957 or later should have 1 or more doses of MMR vaccine unless there is a contraindication to the vaccine or there is laboratory evidence of immunity to each of the three diseases. A routine second dose of MMR vaccine should be obtained at least 28 days after the first dose for students attending postsecondary schools, health care workers, or international travelers. People who received inactivated measles vaccine or an unknown type of measles vaccine during 1963-1967 should receive 2 doses of MMR vaccine. People who received inactivated mumps vaccine or an unknown type of mumps vaccine before 1979 and are at high risk for mumps infection should consider immunization with 2 doses of MMR vaccine. For females of childbearing age, rubella immunity should be determined. If there is no evidence of immunity, females who are not pregnant should be vaccinated. If there is no evidence of immunity, females who are pregnant should delay immunization until after pregnancy. Unvaccinated health care workers born before 1957 who lack laboratory evidence of measles, mumps, or rubella immunity or laboratory confirmation of disease should consider measles and mumps immunization with 2 doses of MMR vaccine or rubella immunization with 1 dose of MMR vaccine.  Pneumococcal 13-valent conjugate (PCV13) vaccine. When indicated, a person who is uncertain of his immunization history and has no record of immunization should receive the PCV13 vaccine. All adults 65 years of age and older should receive this  vaccine. An adult aged 19 years or older who has certain medical conditions and has not been previously immunized should receive 1 dose of PCV13 vaccine. This PCV13 should be followed with a dose of pneumococcal polysaccharide (PPSV23) vaccine. Adults who are at high risk for pneumococcal disease should obtain the PPSV23 vaccine at least 8 weeks after the dose of PCV13 vaccine. Adults older than 75 years of age who have normal immune system function should obtain the PPSV23 vaccine dose at least 1 year after the dose of PCV13 vaccine.  Pneumococcal polysaccharide (PPSV23) vaccine. When PCV13 is also indicated, PCV13 should be obtained first. All adults aged 65 years and older should be immunized. An adult younger than age 65 years who has certain medical conditions should be immunized. Any person who resides in a nursing home or long-term care facility should be immunized. An adult smoker should be immunized. People with an immunocompromised condition and certain other conditions should receive both PCV13 and PPSV23 vaccines. People with human immunodeficiency virus (HIV) infection should be immunized as soon as possible after diagnosis. Immunization during chemotherapy or radiation therapy should be avoided. Routine use of PPSV23 vaccine is not recommended for American Indians, [place] Natives, or people younger than 65 years unless there are medical conditions that require PPSV23 vaccine. When indicated, people who have unknown immunization and have no record of immunization should receive PPSV23 vaccine. One-time revaccination 5 years after the first dose of PPSV23 is recommended for people aged 19-64 years who have chronic kidney failure, nephrotic syndrome, asplenia, or immunocompromised conditions. People who received 1-2 doses of PPSV23 before age 65 years should receive another dose of PPSV23 vaccine at age 65 years or later if at least 5 years have passed since the previous dose. Doses of PPSV23 are not  needed for people immunized with PPSV23 at or after age 65 years.  Meningococcal vaccine. Adults with asplenia or persistent complement component deficiencies should receive 2 doses of quadrivalent meningococcal conjugate (MenACWY-D) vaccine. The doses should be obtained   at least 2 months apart. Microbiologists working with certain meningococcal bacteria, [place] recruits, people at risk during an outbreak, and people who travel to or live in countries with a high rate of meningitis should be immunized. A first-year college student up through age 34 years who is living in a residence hall should receive a dose if she did not receive a dose on or after her 16th birthday. Adults who have certain high-risk conditions should receive one or more doses of vaccine.  Hepatitis A vaccine. Adults who wish to be protected from this disease, have certain high-risk conditions, work with hepatitis A-infected animals, work in hepatitis A research labs, or travel to or work in countries with a high rate of hepatitis A should be immunized. Adults who were previously unvaccinated and who anticipate close contact with an international adoptee during the first 60 days after arrival in the [place] from a country with a high rate of hepatitis A should be immunized.  Hepatitis B vaccine. Adults who wish to be protected from this disease, have certain high-risk conditions, may be exposed to blood or other infectious body fluids, are household contacts or sex partners of hepatitis B positive people, are clients or workers in certain care facilities, or travel to or work in countries with a high rate of hepatitis B should be immunized.  Haemophilus influenzae type b (Hib) vaccine. A previously unvaccinated person with asplenia or sickle cell disease or having a scheduled splenectomy should receive 1 dose of Hib vaccine. Regardless of previous immunization, a recipient of a hematopoietic stem cell transplant should receive a  3-dose series 6-12 months after her successful transplant. Hib vaccine is not recommended for adults with HIV infection. Preventive Services / Frequency Ages 35 to 4 years  Blood pressure check.** / Every 3-5 years.  Lipid and cholesterol check.** / Every 5 years beginning at age 60.  Clinical breast exam.** / Every 3 years for women in their 71s and 10s.  BRCA-related cancer risk assessment.** / For women who have family members with a BRCA-related cancer (breast, ovarian, tubal, or peritoneal cancers).  Pap test.** / Every 2 years from ages 76 through 26. Every 3 years starting at age 61 through age 76 or 93 with a history of 3 consecutive normal Pap tests.  HPV screening.** / Every 3 years from ages 37 through ages 60 to 51 with a history of 3 consecutive normal Pap tests.  Hepatitis C blood test.** / For any individual with known risks for hepatitis C.  Skin self-exam. / Monthly.  Influenza vaccine. / Every year.  Tetanus, diphtheria, and acellular pertussis (Tdap, Td) vaccine.** / Consult your health care provider. Pregnant women should receive 1 dose of Tdap vaccine during each pregnancy. 1 dose of Td every 10 years.  Varicella vaccine.** / Consult your health care provider. Pregnant females who do not have evidence of immunity should receive the first dose after pregnancy.  HPV vaccine. / 3 doses over 6 months, if 93 and younger. The vaccine is not recommended for use in pregnant females. However, pregnancy testing is not needed before receiving a dose.  Measles, mumps, rubella (MMR) vaccine.** / You need at least 1 dose of MMR if you were born in 1957 or later. You may also need a 2nd dose. For females of childbearing age, rubella immunity should be determined. If there is no evidence of immunity, females who are not pregnant should be vaccinated. If there is no evidence of immunity, females who are  pregnant should delay immunization until after pregnancy.  Pneumococcal  13-valent conjugate (PCV13) vaccine.** / Consult your health care provider.  Pneumococcal polysaccharide (PPSV23) vaccine.** / 1 to 2 doses if you smoke cigarettes or if you have certain conditions.  Meningococcal vaccine.** / 1 dose if you are age 68 to 8 years and a Market researcher living in a residence hall, or have one of several medical conditions, you need to get vaccinated against meningococcal disease. You may also need additional booster doses.  Hepatitis A vaccine.** / Consult your health care provider.  Hepatitis B vaccine.** / Consult your health care provider.  Haemophilus influenzae type b (Hib) vaccine.** / Consult your health care provider. Ages 7 to 53 years  Blood pressure check.** / Every year.  Lipid and cholesterol check.** / Every 5 years beginning at age 25 years.  Lung cancer screening. / Every year if you are aged 11-80 years and have a 30-pack-year history of smoking and currently smoke or have quit within the past 15 years. Yearly screening is stopped once you have quit smoking for at least 15 years or develop a health problem that would prevent you from having lung cancer treatment.  Clinical breast exam.** / Every year after age 48 years.  BRCA-related cancer risk assessment.** / For women who have family members with a BRCA-related cancer (breast, ovarian, tubal, or peritoneal cancers).  Mammogram.** / Every year beginning at age 41 years and continuing for as long as you are in good health. Consult with your health care provider.  Pap test.** / Every 3 years starting at age 65 years through age 37 or 70 years with a history of 3 consecutive normal Pap tests.  HPV screening.** / Every 3 years from ages 72 years through ages 60 to 40 years with a history of 3 consecutive normal Pap tests.  Fecal occult blood test (FOBT) of stool. / Every year beginning at age 21 years and continuing until age 5 years. You may not need to do this test if you get  a colonoscopy every 10 years.  Flexible sigmoidoscopy or colonoscopy.** / Every 5 years for a flexible sigmoidoscopy or every 10 years for a colonoscopy beginning at age 35 years and continuing until age 48 years.  Hepatitis C blood test.** / For all people born from [birth year] through 1965 and any individual with known risks for hepatitis C.  Skin self-exam. / Monthly.  Influenza vaccine. / Every year.  Tetanus, diphtheria, and acellular pertussis (Tdap/Td) vaccine.** / Consult your health care provider. Pregnant women should receive 1 dose of Tdap vaccine during each pregnancy. 1 dose of Td every 10 years.  Varicella vaccine.** / Consult your health care provider. Pregnant females who do not have evidence of immunity should receive the first dose after pregnancy.  Zoster vaccine.** / 1 dose for adults aged 30 years or older.  Measles, mumps, rubella (MMR) vaccine.** / You need at least 1 dose of MMR if you were born in 1957 or later. You may also need a second dose. For females of childbearing age, rubella immunity should be determined. If there is no evidence of immunity, females who are not pregnant should be vaccinated. If there is no evidence of immunity, females who are pregnant should delay immunization until after pregnancy.  Pneumococcal 13-valent conjugate (PCV13) vaccine.** / Consult your health care provider.  Pneumococcal polysaccharide (PPSV23) vaccine.** / 1 to 2 doses if you smoke cigarettes or if you have certain conditions.  Meningococcal vaccine.** /  Consult your health care provider.  Hepatitis A vaccine.** / Consult your health care provider.  Hepatitis B vaccine.** / Consult your health care provider.  Haemophilus influenzae type b (Hib) vaccine.** / Consult your health care provider. Ages 64 years and over  Blood pressure check.** / Every year.  Lipid and cholesterol check.** / Every 5 years beginning at age 23 years.  Lung cancer screening. / Every year if you  are aged 16-80 years and have a 30-pack-year history of smoking and currently smoke or have quit within the past 15 years. Yearly screening is stopped once you have quit smoking for at least 15 years or develop a health problem that would prevent you from having lung cancer treatment.  Clinical breast exam.** / Every year after age 74 years.  BRCA-related cancer risk assessment.** / For women who have family members with a BRCA-related cancer (breast, ovarian, tubal, or peritoneal cancers).  Mammogram.** / Every year beginning at age 44 years and continuing for as long as you are in good health. Consult with your health care provider.  Pap test.** / Every 3 years starting at age 58 years through age 22 or 39 years with 3 consecutive normal Pap tests. Testing can be stopped between 65 and 70 years with 3 consecutive normal Pap tests and no abnormal Pap or HPV tests in the past 10 years.  HPV screening.** / Every 3 years from ages 64 years through ages 70 or 61 years with a history of 3 consecutive normal Pap tests. Testing can be stopped between 65 and 70 years with 3 consecutive normal Pap tests and no abnormal Pap or HPV tests in the past 10 years.  Fecal occult blood test (FOBT) of stool. / Every year beginning at age 40 years and continuing until age 27 years. You may not need to do this test if you get a colonoscopy every 10 years.  Flexible sigmoidoscopy or colonoscopy.** / Every 5 years for a flexible sigmoidoscopy or every 10 years for a colonoscopy beginning at age 7 years and continuing until age 32 years.  Hepatitis C blood test.** / For all people born from [birth year] through 1965 and any individual with known risks for hepatitis C.  Osteoporosis screening.** / A one-time screening for women ages 30 years and over and women at risk for fractures or osteoporosis.  Skin self-exam. / Monthly.  Influenza vaccine. / Every year.  Tetanus, diphtheria, and acellular pertussis (Tdap/Td)  vaccine.** / 1 dose of Td every 10 years.  Varicella vaccine.** / Consult your health care provider.  Zoster vaccine.** / 1 dose for adults aged 35 years or older.  Pneumococcal 13-valent conjugate (PCV13) vaccine.** / Consult your health care provider.  Pneumococcal polysaccharide (PPSV23) vaccine.** / 1 dose for all adults aged 46 years and older.  Meningococcal vaccine.** / Consult your health care provider.  Hepatitis A vaccine.** / Consult your health care provider.  Hepatitis B vaccine.** / Consult your health care provider.  Haemophilus influenzae type b (Hib) vaccine.** / Consult your health care provider. ** Family history and personal history of risk and conditions may change your health care provider's recommendations.   This information is not intended to replace advice given to you by your health care provider. Make sure you discuss any questions you have with your health care provider.   Document Released: 04/12/2001 Document Revised: 03/07/2014 Document Reviewed: 07/12/2010 Elsevier Interactive Patient Education [employer].

## 2015-08-17 ENCOUNTER — Ambulatory Visit (INDEPENDENT_AMBULATORY_CARE_PROVIDER_SITE_OTHER): Payer: Medicare Other | Admitting: *Deleted

## 2015-08-17 DIAGNOSIS — R55 Syncope and collapse: Secondary | ICD-10-CM | POA: Diagnosis not present

## 2015-08-17 NOTE — Progress Notes (Signed)
Carelink Summary Report / Loop Recorder 

## 2015-08-21 ENCOUNTER — Telehealth: Payer: Self-pay | Admitting: Cardiology

## 2015-08-21 MED ORDER — ROSUVASTATIN CALCIUM 5 MG PO TABS: 5.0000 mg | ORAL_TABLET | Freq: Every day | ORAL | Status: DC

## 2015-08-21 NOTE — Telephone Encounter (Signed)
New message     Patient receive paperwork has some questions.

## 2015-08-21 NOTE — Telephone Encounter (Signed)
Spoke with pt, she is not going to be able to tolerate the lipitor. Her urinary symptoms have returned and she is having joint and muscle pain. She is willing to try low dose crestor. Will forward to dr Jens Somcrenshaw for dosing.

## 2015-08-21 NOTE — Telephone Encounter (Signed)
Spoke with pt, Aware of dr crenshaw's recommendations.  ?New script sent to the pharmacy  ?Lab orders mailed to the pt  ?

## 2015-08-21 NOTE — Telephone Encounter (Signed)
Dc lipitor; try crestor 5 mg daily; lipids and liver and CK six weeks Olga MillersBrian Crenshaw

## 2015-08-26 ENCOUNTER — Telehealth: Payer: Self-pay | Admitting: *Deleted

## 2015-08-26 LAB — CUP PACEART REMOTE DEVICE CHECK
Date Time Interrogation Session: 20170519203721
Date Time Interrogation Session: 20170618210511

## 2015-08-26 NOTE — Telephone Encounter (Signed)
Called patient regarding ILR.  Patient reports that she has been feeling well since she was discharged from the hospital on 06/17/15 (amlodipine was discontinued).  She feels that amlodipine was the cause of her syncopal symptoms.  She denies any symptoms since this hospitalization.  Patient is aware to use her symptom activator and call our office or seek emergency medical attention if she experiences recurrent syncopal symptoms.  Patient is appreciative of assistance and denies questions or concerns at this time.

## 2015-09-15 ENCOUNTER — Ambulatory Visit (INDEPENDENT_AMBULATORY_CARE_PROVIDER_SITE_OTHER): Payer: Medicare Other | Admitting: *Deleted

## 2015-09-15 DIAGNOSIS — R55 Syncope and collapse: Secondary | ICD-10-CM | POA: Diagnosis not present

## 2015-09-16 NOTE — Progress Notes (Signed)
Carelink Summary Report / Loop Recorder 

## 2015-09-28 ENCOUNTER — Telehealth: Payer: Self-pay | Admitting: *Deleted

## 2015-09-28 ENCOUNTER — Encounter (HOSPITAL_COMMUNITY): Payer: Self-pay | Admitting: Emergency Medicine

## 2015-09-28 ENCOUNTER — Inpatient Hospital Stay (HOSPITAL_COMMUNITY)
Admission: EM | Admit: 2015-09-28 | Discharge: 2015-10-03 | DRG: 243 | Disposition: A | Discharge status: Home Health | Payer: Medicare Other | Attending: Cardiology | Admitting: Cardiology

## 2015-09-28 ENCOUNTER — Encounter: Payer: No Typology Code available for payment source | Admitting: Family Medicine

## 2015-09-28 DIAGNOSIS — R001 Bradycardia, unspecified: Secondary | ICD-10-CM

## 2015-09-28 DIAGNOSIS — J95811 Postprocedural pneumothorax: Secondary | ICD-10-CM | POA: Diagnosis not present

## 2015-09-28 DIAGNOSIS — I421 Obstructive hypertrophic cardiomyopathy: Secondary | ICD-10-CM | POA: Diagnosis present

## 2015-09-28 DIAGNOSIS — M1990 Unspecified osteoarthritis, unspecified site: Secondary | ICD-10-CM | POA: Diagnosis present

## 2015-09-28 DIAGNOSIS — I10 Essential (primary) hypertension: Secondary | ICD-10-CM | POA: Diagnosis not present

## 2015-09-28 DIAGNOSIS — M40209 Unspecified kyphosis, site unspecified: Secondary | ICD-10-CM | POA: Diagnosis present

## 2015-09-28 DIAGNOSIS — K219 Gastro-esophageal reflux disease without esophagitis: Secondary | ICD-10-CM | POA: Diagnosis present

## 2015-09-28 DIAGNOSIS — J942 Hemothorax: Secondary | ICD-10-CM

## 2015-09-28 DIAGNOSIS — I442 Atrioventricular block, complete: Principal | ICD-10-CM | POA: Diagnosis present

## 2015-09-28 DIAGNOSIS — Z8261 Family history of arthritis: Secondary | ICD-10-CM

## 2015-09-28 DIAGNOSIS — Z7982 Long term (current) use of aspirin: Secondary | ICD-10-CM

## 2015-09-28 DIAGNOSIS — Z8249 Family history of ischemic heart disease and other diseases of the circulatory system: Secondary | ICD-10-CM

## 2015-09-28 DIAGNOSIS — Z880 Allergy status to penicillin: Secondary | ICD-10-CM

## 2015-09-28 DIAGNOSIS — E876 Hypokalemia: Secondary | ICD-10-CM | POA: Diagnosis present

## 2015-09-28 DIAGNOSIS — Z950 Presence of cardiac pacemaker: Secondary | ICD-10-CM | POA: Diagnosis present

## 2015-09-28 DIAGNOSIS — R55 Syncope and collapse: Secondary | ICD-10-CM | POA: Diagnosis not present

## 2015-09-28 DIAGNOSIS — Z79899 Other long term (current) drug therapy: Secondary | ICD-10-CM

## 2015-09-28 DIAGNOSIS — F419 Anxiety disorder, unspecified: Secondary | ICD-10-CM | POA: Diagnosis present

## 2015-09-28 DIAGNOSIS — E785 Hyperlipidemia, unspecified: Secondary | ICD-10-CM | POA: Diagnosis present

## 2015-09-28 DIAGNOSIS — I422 Other hypertrophic cardiomyopathy: Secondary | ICD-10-CM | POA: Diagnosis present

## 2015-09-28 DIAGNOSIS — Z9689 Presence of other specified functional implants: Secondary | ICD-10-CM

## 2015-09-28 DIAGNOSIS — R42 Dizziness and giddiness: Secondary | ICD-10-CM | POA: Diagnosis not present

## 2015-09-28 DIAGNOSIS — Z09 Encounter for follow-up examination after completed treatment for conditions other than malignant neoplasm: Secondary | ICD-10-CM

## 2015-09-28 DIAGNOSIS — Z4682 Encounter for fitting and adjustment of non-vascular catheter: Secondary | ICD-10-CM

## 2015-09-28 DIAGNOSIS — I447 Left bundle-branch block, unspecified: Secondary | ICD-10-CM | POA: Diagnosis present

## 2015-09-28 DIAGNOSIS — Z959 Presence of cardiac and vascular implant and graft, unspecified: Secondary | ICD-10-CM

## 2015-09-28 DIAGNOSIS — I493 Ventricular premature depolarization: Secondary | ICD-10-CM | POA: Diagnosis present

## 2015-09-28 DIAGNOSIS — Z87891 Personal history of nicotine dependence: Secondary | ICD-10-CM

## 2015-09-28 DIAGNOSIS — H0520 Unspecified exophthalmos: Secondary | ICD-10-CM | POA: Diagnosis present

## 2015-09-28 DIAGNOSIS — I252 Old myocardial infarction: Secondary | ICD-10-CM

## 2015-09-28 DIAGNOSIS — Z888 Allergy status to other drugs, medicaments and biological substances status: Secondary | ICD-10-CM

## 2015-09-28 DIAGNOSIS — Z9861 Coronary angioplasty status: Secondary | ICD-10-CM

## 2015-09-28 DIAGNOSIS — I2510 Atherosclerotic heart disease of native coronary artery without angina pectoris: Secondary | ICD-10-CM

## 2015-09-28 DIAGNOSIS — Z95818 Presence of other cardiac implants and grafts: Secondary | ICD-10-CM

## 2015-09-28 DIAGNOSIS — F411 Generalized anxiety disorder: Secondary | ICD-10-CM | POA: Diagnosis present

## 2015-09-28 DIAGNOSIS — Z955 Presence of coronary angioplasty implant and graft: Secondary | ICD-10-CM

## 2015-09-28 HISTORY — DX: Atrioventricular block, complete: I44.2

## 2015-09-28 HISTORY — DX: Presence of cardiac pacemaker: Z95.0

## 2015-09-28 LAB — CBC
HEMATOCRIT: 46.2 % — AB (ref 36.0–46.0)
Hemoglobin: 14.8 g/dL (ref 12.0–15.0)
MCH: 28.9 pg (ref 26.0–34.0)
MCHC: 32 g/dL (ref 30.0–36.0)
MCV: 90.2 fL (ref 78.0–100.0)
PLATELETS: 272 10*3/uL (ref 150–400)
RBC: 5.12 MIL/uL — ABNORMAL HIGH (ref 3.87–5.11)
RDW: 13.8 % (ref 11.5–15.5)
WBC: 8.1 10*3/uL (ref 4.0–10.5)

## 2015-09-28 LAB — BASIC METABOLIC PANEL
Anion gap: 10 (ref 5–15)
BUN: 12 mg/dL (ref 6–20)
CALCIUM: 10 mg/dL (ref 8.9–10.3)
CO2: 27 mmol/L (ref 22–32)
Chloride: 101 mmol/L (ref 101–111)
Creatinine, Ser: 0.81 mg/dL (ref 0.44–1.00)
GFR calc Af Amer: >60 mL/min (ref >60)
GLUCOSE: 111 mg/dL — AB (ref 65–99)
POTASSIUM: 3.4 mmol/L — AB (ref 3.5–5.1)
Sodium: 138 mmol/L (ref 135–145)

## 2015-09-28 LAB — TROPONIN I: Troponin I: <0.03 ng/mL (ref <0.03)

## 2015-09-28 LAB — MRSA PCR SCREENING: MRSA by PCR: NEGATIVE

## 2015-09-28 LAB — MAGNESIUM: Magnesium: 2.2 mg/dL (ref 1.7–2.4)

## 2015-09-28 LAB — TSH: TSH: 2.843 u[IU]/mL (ref 0.350–4.500)

## 2015-09-28 MED ORDER — ASPIRIN EC 81 MG PO TBEC
81.0000 mg | DELAYED_RELEASE_TABLET | Freq: Every day | ORAL | Status: DC
  Administered 2015-09-29: 81 mg via ORAL
  Filled 2015-09-28: qty 1

## 2015-09-28 MED ORDER — METOPROLOL TARTRATE 25 MG PO TABS
25.0000 mg | ORAL_TABLET | Freq: Two times a day (BID) | ORAL | Status: DC
  Administered 2015-09-28: 25 mg via ORAL
  Filled 2015-09-28: qty 1

## 2015-09-28 MED ORDER — ONDANSETRON HCL 4 MG/2ML IJ SOLN: 4.0000 mg | Freq: Four times a day (QID) | INTRAMUSCULAR | Status: DC | PRN

## 2015-09-28 MED ORDER — VITAMIN D 1000 UNITS PO TABS
4000.0000 [IU] | ORAL_TABLET | Freq: Every evening | ORAL | Status: DC
  Administered 2015-09-29 – 2015-09-30 (×2): 4000 [IU] via ORAL
  Filled 2015-09-28 (×6): qty 4

## 2015-09-28 MED ORDER — ACETAMINOPHEN 325 MG PO TABS: 650.0000 mg | ORAL_TABLET | ORAL | Status: DC | PRN

## 2015-09-28 MED ORDER — ASPIRIN EC 81 MG PO TBEC: 81.0000 mg | DELAYED_RELEASE_TABLET | Freq: Every evening | ORAL | Status: DC

## 2015-09-28 MED ORDER — DOCUSATE SODIUM 100 MG PO CAPS
100.0000 mg | ORAL_CAPSULE | Freq: Two times a day (BID) | ORAL | Status: DC
  Administered 2015-09-28 – 2015-10-03 (×10): 100 mg via ORAL
  Filled 2015-09-28 (×10): qty 1

## 2015-09-28 MED ORDER — HYDRALAZINE HCL 10 MG PO TABS: 10.0000 mg | ORAL_TABLET | Freq: Once | ORAL | Status: DC

## 2015-09-28 MED ORDER — LISINOPRIL 10 MG PO TABS
20.0000 mg | ORAL_TABLET | Freq: Two times a day (BID) | ORAL | Status: DC
  Administered 2015-09-28 – 2015-10-03 (×10): 20 mg via ORAL
  Filled 2015-09-28 (×3): qty 2
  Filled 2015-09-28: qty 1
  Filled 2015-09-28 (×2): qty 2
  Filled 2015-09-28: qty 1
  Filled 2015-09-28 (×2): qty 2
  Filled 2015-09-28: qty 1

## 2015-09-28 MED ORDER — ALPRAZOLAM 0.25 MG PO TABS
0.1250 mg | ORAL_TABLET | Freq: Two times a day (BID) | ORAL | Status: DC
  Administered 2015-09-28 – 2015-10-03 (×9): 0.125 mg via ORAL
  Filled 2015-09-28 (×10): qty 1

## 2015-09-28 MED ORDER — HYDRALAZINE HCL 10 MG PO TABS
10.0000 mg | ORAL_TABLET | ORAL | Status: DC | PRN
  Administered 2015-09-29: 10 mg via ORAL
  Filled 2015-09-28: qty 1

## 2015-09-28 NOTE — Telephone Encounter (Signed)
Spoke with patient and advised her to call 911 and proceed to the [facility] ED for further assessment per Dr. Odessa Fleming recommendations.  Per Dr. Graciela Husbands, symptom/pause episode from 09/26/15 suggestive of CHB and torsades.  Patient agreeable and requests that I contact her daughter.    Called patient's daughter, Nicole Watkins, who reports she is a Engineer, civil (consulting).  Nicole Watkins states that her mother has "extreme medical anxiety" and that she is eight hours away so she cannot transport the patient herself.  Nicole Watkins verbalizes understanding of Dr. Odessa Fleming recommendations but states that she is going to try to have her son (who works in [employer]) drive the patient to the ED because "she will give herself a heart attack".  She states that if she is unable to reach him, she will have the patient call 911.  She denies additional questions at this time and is aware to call with questions or concerns.  [name] at [facility] made aware of patient's situation.

## 2015-09-28 NOTE — ED Provider Notes (Signed)
Patient briefly in ED after transferring from cariology clinic as a direct admission.   Gerhard Munch, MD 09/28/15 [phone]

## 2015-09-28 NOTE — H&P (Signed)
Cardiology H&P    Patient ID: Nicole Watkins MRN: [id number], DOB/AGE: [birth year]   Admit date: 09/28/2015 Date of Consult: 09/28/2015  Primary Physician: Donato Schultz, DO Reason for Consult: CHB on loop recorder Primary Cardiologist: Dr. Truett Mainland   Patient Profile  Nicole Watkins is a 75 year old female with a past medical history of hypertension,hypertrophic cardiomyopathy, hyperlipidemia, coronary artery disease (DES to circumflex in 2015). Also has a history of syncope in April 2017. She was seen by electrophysiology and there was some concern for intermittent complete heart block given her baseline left bundle branch block with her history of MI. An implantable loop recorder was inserted on 06/17/2015.  History of Present Illness  Our office contacted her today as her loop recorder showed complete heart block and torsades. She was advised to call 911 per Dr. Graciela Husbands, and proceed to [facility] ED for further assessment.   The patient's daughter was also called by the office, and patient's daughter states that the patient has "extreme medical anxiety". The daughter says that if 911 is called, her mother would "give herself a heart attack". She was worried that the commotion of calling 911 and would affect the patient's mental health. So, she had her brother(the patient's son) drive the patient to the hospital.  She was hospitalized in April of this year for syncope. At that time it was felt that she would best be served by the implantation of a loop recorder. She had loop recorder placed on 06/17/2015. She has had no syncope since this episode. At that time, it was suggested that she have a nuclear study to assess for ischemic cause for syncope. She declined.  She was last seen in our office in May 2017 by Dr. [name]. She was doing well with no chest pain, palpitations, or syncope. Her low-dose metoprolol was continued as she has hypertrophic obstructive  cardiomyopathy.  Denies chest pain, syncope and dizziness.     Past Medical History   Past Medical History:  Diagnosis Date  . Anxiety   . Arthritis   . CAD (coronary artery disease)   . GERD (gastroesophageal reflux disease)   . Hyperlipidemia   . Hypertension   . LBBB (left bundle branch block)   . Thyroid-related proptosis     Past Surgical History:  Procedure Laterality Date  . CARDIAC SURGERY     stent placed 03/04/13  . EP IMPLANTABLE DEVICE N/A 06/17/2015   Procedure: Loop Recorder Insertion;  Surgeon: [name], MD;  Location: [facility] INVASIVE CV LAB;  Service: Cardiovascular;  Laterality: N/A;  . No family history       Allergies  Allergies  Allergen Reactions  . Lipitor [Atorvastatin] Other (See Comments)    HA, depression, vomiting, headache  . Penicillins Anaphylaxis    Has patient had a PCN reaction causing immediate rash, facial/tongue/throat swelling, SOB or lightheadedness with hypotension: Yes Has patient had a PCN reaction causing severe rash involving mucus membranes or skin necrosis: No Has patient had a PCN reaction that required hospitalization No Has patient had a PCN reaction occurring within the last 10 years: No If all of the above answers are "NO", then may proceed with Cephalosporin use.   Gasper Lloyd [Rosuvastatin Calcium] Nausea And Vomiting    Depression, heading    Inpatient Medications      Family History    Family History  Problem Relation Age of Onset  . Arthritis Mother   . Hyperlipidemia Mother   .  Hypertension Mother   . Hypothyroidism Mother   . Uterine cancer Maternal Grandmother     Social History    Social History   Social History  . Marital status: Widowed    Spouse name: N/A  . Number of children: 1  . Years of education: N/A   Occupational History  . Not on file.   Social History Main Topics  . Smoking status: Former Games developer  . Smokeless tobacco: Never Used  . Alcohol use No  . Drug use: Unknown  .  Sexual activity: Not on file   Other Topics Concern  . Not on file   Social History Narrative  . No narrative on file     Review of Systems    General:  No chills, fever, night sweats or weight changes.  Cardiovascular:  No chest pain, dyspnea on exertion, edema, orthopnea, palpitations, paroxysmal nocturnal dyspnea. Dermatological: No rash, lesions/masses Respiratory: No cough, dyspnea Urologic: No hematuria, dysuria Abdominal:   No nausea, vomiting, diarrhea, bright red blood per rectum, melena, or hematemesis Neurologic:  No visual changes, wkns, changes in mental status. All other systems reviewed and are otherwise negative except as noted above.  Physical Exam    Blood pressure (!) 205/97, pulse 68, temperature 98.3 F (36.8 C), temperature source Oral, resp. rate 12, height  (1.626 m), weight 130 lb (59 kg), SpO2 97 %.  General: Pleasant, NAD Psych: Normal affect. Neuro: Alert and oriented X 3. Moves all extremities spontaneously. HEENT: Normal  Neck: Supple without bruits or JVD. Lungs:  Resp regular and unlabored, CTA. Heart: RRR no s3, s4, 3/6 systolic murmur.  Abdomen: Soft, non-tender, non-distended, BS + x 4.  Extremities: No clubbing, cyanosis or edema. DP/PT/Radials 2+ and equal bilaterally.  Labs     Radiology Studies    No results found.  EKG & Cardiac Imaging    EKG: NSR, LBBB  Echocardiogram: pending.   Assessment & Plan  1. Arrhythmia detected by loop recorder: Will admit to ICU and watch closely for arrhythmia. According to reports she was in complete heart block and had some episodes of torsades. We will check her electrolytes including magnesium. EP to see in the am, she will likely need PPM.   2. History of coronary artery disease: Continue ASA. Chest pain free.   3. HOCM: Will avoid Nitro.   Signed, Little Ishikawa, NP 09/28/2015, 7:31 PM Pager: [phone]

## 2015-09-28 NOTE — ED Triage Notes (Addendum)
Pt has loop recorder for fainting spells. Pt has not had an episode since April when loop recorder was placed. Pt states she felt dizzy/like she was going to pass out. Pt pressed her button on loop recorder. MD office called her to inform her she had 12 sec pause and to go to ED. Pt here in ED and states she feels normal at this time.

## 2015-09-28 NOTE — Telephone Encounter (Signed)
Spoke with patient regarding pause and brady episode on 09/26/15.  Per patient, she felt brief dizziness, but denies true syncope.  Patient is aware of her scheduled appointment with Dr. Graciela Husbands tomorrow, 09/29/15 at 2:15pm.  Advised patient that if her symptoms worsen in the interim, to call 911 and proceed to the ED.  Patient verbalizes understanding of instructions.  Will review with Dr. Graciela Husbands and call patient back with any additional recommendations.

## 2015-09-28 NOTE — ED Notes (Signed)
Pt very anxious at this time, Xanax given as ordered, BP medication given for BP control, Dinner ordered from cafeteria, family a the bedside, pt resting on bed comfortable, denies pain NAD noticed.

## 2015-09-29 ENCOUNTER — Encounter: Payer: Medicare Other | Admitting: Internal Medicine

## 2015-09-29 ENCOUNTER — Encounter (HOSPITAL_COMMUNITY)
Admission: EM | Disposition: A | Discharge status: Home Health | Payer: Self-pay | Source: Home / Self Care | Attending: Cardiology

## 2015-09-29 ENCOUNTER — Observation Stay (HOSPITAL_BASED_OUTPATIENT_CLINIC_OR_DEPARTMENT_OTHER): Payer: Medicare Other

## 2015-09-29 DIAGNOSIS — K219 Gastro-esophageal reflux disease without esophagitis: Secondary | ICD-10-CM | POA: Diagnosis present

## 2015-09-29 DIAGNOSIS — M40209 Unspecified kyphosis, site unspecified: Secondary | ICD-10-CM | POA: Diagnosis present

## 2015-09-29 DIAGNOSIS — E876 Hypokalemia: Secondary | ICD-10-CM | POA: Diagnosis present

## 2015-09-29 DIAGNOSIS — J9311 Primary spontaneous pneumothorax: Secondary | ICD-10-CM | POA: Diagnosis not present

## 2015-09-29 DIAGNOSIS — R0602 Shortness of breath: Secondary | ICD-10-CM | POA: Diagnosis not present

## 2015-09-29 DIAGNOSIS — Z87891 Personal history of nicotine dependence: Secondary | ICD-10-CM | POA: Diagnosis not present

## 2015-09-29 DIAGNOSIS — Z4682 Encounter for fitting and adjustment of non-vascular catheter: Secondary | ICD-10-CM | POA: Diagnosis not present

## 2015-09-29 DIAGNOSIS — F419 Anxiety disorder, unspecified: Secondary | ICD-10-CM | POA: Diagnosis present

## 2015-09-29 DIAGNOSIS — R55 Syncope and collapse: Secondary | ICD-10-CM | POA: Diagnosis present

## 2015-09-29 DIAGNOSIS — Z79899 Other long term (current) drug therapy: Secondary | ICD-10-CM | POA: Diagnosis not present

## 2015-09-29 DIAGNOSIS — Z880 Allergy status to penicillin: Secondary | ICD-10-CM | POA: Diagnosis not present

## 2015-09-29 DIAGNOSIS — J439 Emphysema, unspecified: Secondary | ICD-10-CM | POA: Diagnosis not present

## 2015-09-29 DIAGNOSIS — Z888 Allergy status to other drugs, medicaments and biological substances status: Secondary | ICD-10-CM | POA: Diagnosis not present

## 2015-09-29 DIAGNOSIS — I459 Conduction disorder, unspecified: Secondary | ICD-10-CM | POA: Diagnosis not present

## 2015-09-29 DIAGNOSIS — M1990 Unspecified osteoarthritis, unspecified site: Secondary | ICD-10-CM | POA: Diagnosis present

## 2015-09-29 DIAGNOSIS — Z7982 Long term (current) use of aspirin: Secondary | ICD-10-CM | POA: Diagnosis not present

## 2015-09-29 DIAGNOSIS — R9431 Abnormal electrocardiogram [ECG] [EKG]: Secondary | ICD-10-CM | POA: Diagnosis not present

## 2015-09-29 DIAGNOSIS — I10 Essential (primary) hypertension: Secondary | ICD-10-CM | POA: Diagnosis present

## 2015-09-29 DIAGNOSIS — I422 Other hypertrophic cardiomyopathy: Secondary | ICD-10-CM | POA: Diagnosis present

## 2015-09-29 DIAGNOSIS — R42 Dizziness and giddiness: Secondary | ICD-10-CM | POA: Diagnosis present

## 2015-09-29 DIAGNOSIS — H0520 Unspecified exophthalmos: Secondary | ICD-10-CM | POA: Diagnosis present

## 2015-09-29 DIAGNOSIS — I2510 Atherosclerotic heart disease of native coronary artery without angina pectoris: Secondary | ICD-10-CM

## 2015-09-29 DIAGNOSIS — E785 Hyperlipidemia, unspecified: Secondary | ICD-10-CM | POA: Diagnosis present

## 2015-09-29 DIAGNOSIS — I421 Obstructive hypertrophic cardiomyopathy: Secondary | ICD-10-CM | POA: Diagnosis present

## 2015-09-29 DIAGNOSIS — J939 Pneumothorax, unspecified: Secondary | ICD-10-CM | POA: Diagnosis not present

## 2015-09-29 DIAGNOSIS — J95811 Postprocedural pneumothorax: Secondary | ICD-10-CM | POA: Diagnosis not present

## 2015-09-29 DIAGNOSIS — I447 Left bundle-branch block, unspecified: Secondary | ICD-10-CM | POA: Diagnosis present

## 2015-09-29 DIAGNOSIS — R001 Bradycardia, unspecified: Secondary | ICD-10-CM | POA: Diagnosis present

## 2015-09-29 DIAGNOSIS — I442 Atrioventricular block, complete: Principal | ICD-10-CM

## 2015-09-29 DIAGNOSIS — I252 Old myocardial infarction: Secondary | ICD-10-CM | POA: Diagnosis not present

## 2015-09-29 DIAGNOSIS — Z95818 Presence of other cardiac implants and grafts: Secondary | ICD-10-CM | POA: Diagnosis not present

## 2015-09-29 HISTORY — PX: EP IMPLANTABLE DEVICE: SHX172B

## 2015-09-29 LAB — CBC
HCT: 45.3 % (ref 36.0–46.0)
HEMOGLOBIN: 14.5 g/dL (ref 12.0–15.0)
MCH: 29.1 pg (ref 26.0–34.0)
MCHC: 32 g/dL (ref 30.0–36.0)
MCV: 91 fL (ref 78.0–100.0)
Platelets: 252 10*3/uL (ref 150–400)
RBC: 4.98 MIL/uL (ref 3.87–5.11)
RDW: 13.9 % (ref 11.5–15.5)
WBC: 8.4 10*3/uL (ref 4.0–10.5)

## 2015-09-29 LAB — PROTIME-INR
INR: 1.09
PROTHROMBIN TIME: 14.1 s (ref 11.4–15.2)

## 2015-09-29 LAB — TROPONIN I: Troponin I: <0.03 ng/mL (ref <0.03)

## 2015-09-29 LAB — BASIC METABOLIC PANEL
ANION GAP: 7 (ref 5–15)
BUN: 12 mg/dL (ref 6–20)
CALCIUM: 9.6 mg/dL (ref 8.9–10.3)
CO2: 28 mmol/L (ref 22–32)
Chloride: 102 mmol/L (ref 101–111)
Creatinine, Ser: 0.82 mg/dL (ref 0.44–1.00)
GFR calc non Af Amer: >60 mL/min (ref >60)
Glucose, Bld: 109 mg/dL — ABNORMAL HIGH (ref 65–99)
Potassium: 3.3 mmol/L — ABNORMAL LOW (ref 3.5–5.1)
Sodium: 137 mmol/L (ref 135–145)

## 2015-09-29 LAB — SURGICAL PCR SCREEN
MRSA, PCR: NEGATIVE
STAPHYLOCOCCUS AUREUS: NEGATIVE

## 2015-09-29 LAB — ECHOCARDIOGRAM COMPLETE
HEIGHTINCHES: 64 in
WEIGHTICAEL: 2014.12 [oz_av]

## 2015-09-29 SURGERY — PACEMAKER IMPLANT

## 2015-09-29 MED ORDER — IOPAMIDOL (ISOVUE-370) INJECTION 76%
INTRAVENOUS | Status: AC
  Filled 2015-09-29: qty 50

## 2015-09-29 MED ORDER — CHLORHEXIDINE GLUCONATE 4 % EX LIQD
60.0000 mL | Freq: Once | CUTANEOUS | Status: AC
  Administered 2015-09-29: 4 via TOPICAL

## 2015-09-29 MED ORDER — CHLORHEXIDINE GLUCONATE 4 % EX LIQD
60.0000 mL | Freq: Once | CUTANEOUS | Status: AC
  Administered 2015-09-29: 4 via TOPICAL
  Filled 2015-09-29: qty 60

## 2015-09-29 MED ORDER — HEPARIN (PORCINE) IN NACL 2-0.9 UNIT/ML-% IJ SOLN
INTRAMUSCULAR | Status: AC
  Filled 2015-09-29: qty 500

## 2015-09-29 MED ORDER — MIDAZOLAM HCL 5 MG/5ML IJ SOLN
INTRAMUSCULAR | Status: DC | PRN
  Administered 2015-09-29 (×2): 0.5 mg via INTRAVENOUS

## 2015-09-29 MED ORDER — HEPARIN (PORCINE) IN NACL 2-0.9 UNIT/ML-% IJ SOLN
INTRAMUSCULAR | Status: DC | PRN
  Administered 2015-09-29: 500 mL

## 2015-09-29 MED ORDER — CHLORHEXIDINE GLUCONATE 4 % EX LIQD
CUTANEOUS | Status: AC
  Filled 2015-09-29: qty 15

## 2015-09-29 MED ORDER — LIDOCAINE HCL (PF) 1 % IJ SOLN
INTRAMUSCULAR | Status: AC
  Filled 2015-09-29: qty 60

## 2015-09-29 MED ORDER — FENTANYL CITRATE (PF) 100 MCG/2ML IJ SOLN
INTRAMUSCULAR | Status: AC
  Filled 2015-09-29: qty 2

## 2015-09-29 MED ORDER — SODIUM CHLORIDE 0.9% FLUSH: 3.0000 mL | INTRAVENOUS | Status: DC | PRN

## 2015-09-29 MED ORDER — VANCOMYCIN HCL IN DEXTROSE 1-5 GM/200ML-% IV SOLN
1000.0000 mg | INTRAVENOUS | Status: AC
  Administered 2015-09-29: 1000 mg via INTRAVENOUS
  Filled 2015-09-29: qty 200

## 2015-09-29 MED ORDER — IOPAMIDOL (ISOVUE-370) INJECTION 76%
INTRAVENOUS | Status: DC | PRN
  Administered 2015-09-29: 15 mL via INTRAVENOUS

## 2015-09-29 MED ORDER — MIDAZOLAM HCL 5 MG/5ML IJ SOLN
INTRAMUSCULAR | Status: AC
  Filled 2015-09-29: qty 5

## 2015-09-29 MED ORDER — YOU HAVE A PACEMAKER BOOK
Freq: Once | Status: AC
  Administered 2015-09-29: 12:00:00
  Filled 2015-09-29: qty 1

## 2015-09-29 MED ORDER — ONDANSETRON HCL 4 MG/2ML IJ SOLN: 4.0000 mg | Freq: Four times a day (QID) | INTRAMUSCULAR | Status: DC | PRN

## 2015-09-29 MED ORDER — CETYLPYRIDINIUM CHLORIDE 0.05 % MT LIQD
7.0000 mL | Freq: Two times a day (BID) | OROMUCOSAL | Status: DC
  Administered 2015-09-30 – 2015-10-02 (×3): 7 mL via OROMUCOSAL

## 2015-09-29 MED ORDER — ACETAMINOPHEN 325 MG PO TABS: 325.0000 mg | ORAL_TABLET | ORAL | Status: DC | PRN

## 2015-09-29 MED ORDER — SODIUM CHLORIDE 0.9 % IV SOLN
INTRAVENOUS | Status: DC
  Administered 2015-09-29: 12:00:00 via INTRAVENOUS

## 2015-09-29 MED ORDER — SODIUM CHLORIDE 0.9 % IR SOLN
Status: AC
  Filled 2015-09-29: qty 2

## 2015-09-29 MED ORDER — VANCOMYCIN HCL IN DEXTROSE 1-5 GM/200ML-% IV SOLN
1000.0000 mg | Freq: Two times a day (BID) | INTRAVENOUS | Status: AC
  Administered 2015-09-30: 1000 mg via INTRAVENOUS
  Filled 2015-09-29: qty 200

## 2015-09-29 MED ORDER — SODIUM CHLORIDE 0.9 % IV SOLN: 250.0000 mL | INTRAVENOUS | Status: DC | PRN

## 2015-09-29 MED ORDER — LIDOCAINE HCL (PF) 1 % IJ SOLN
INTRAMUSCULAR | Status: DC | PRN
  Administered 2015-09-29: 45 mL

## 2015-09-29 MED ORDER — ALPRAZOLAM 0.25 MG PO TABS
0.1250 mg | ORAL_TABLET | Freq: Two times a day (BID) | ORAL | Status: DC | PRN
  Administered 2015-09-30 – 2015-10-01 (×3): 0.125 mg via ORAL
  Filled 2015-09-29 (×2): qty 1

## 2015-09-29 MED ORDER — SODIUM CHLORIDE 0.9% FLUSH
3.0000 mL | Freq: Two times a day (BID) | INTRAVENOUS | Status: DC
  Administered 2015-09-29 – 2015-10-03 (×8): 3 mL via INTRAVENOUS

## 2015-09-29 MED ORDER — HYDROCODONE-ACETAMINOPHEN 5-325 MG PO TABS
1.0000 | ORAL_TABLET | ORAL | Status: DC | PRN
  Administered 2015-09-29 – 2015-10-03 (×16): 1 via ORAL
  Administered 2015-10-03: 2 via ORAL
  Filled 2015-09-29 (×4): qty 1
  Filled 2015-09-29: qty 2
  Filled 2015-09-29 (×13): qty 1

## 2015-09-29 MED ORDER — SODIUM CHLORIDE 0.9 % IR SOLN
80.0000 mg | Status: AC
  Administered 2015-09-29: 80 mg
  Filled 2015-09-29: qty 2

## 2015-09-29 MED ORDER — VANCOMYCIN HCL IN DEXTROSE 1-5 GM/200ML-% IV SOLN
INTRAVENOUS | Status: AC
  Filled 2015-09-29: qty 200

## 2015-09-29 MED ORDER — POTASSIUM CHLORIDE CRYS ER 20 MEQ PO TBCR
40.0000 meq | EXTENDED_RELEASE_TABLET | Freq: Once | ORAL | Status: AC
  Administered 2015-09-29: 40 meq via ORAL
  Filled 2015-09-29: qty 2

## 2015-09-29 SURGICAL SUPPLY — 10 items
CABLE SURGICAL S-101-97-12 (CABLE) ×3 IMPLANT
CLOSURE WOUND 1/2 X4 (GAUZE/BANDAGES/DRESSINGS) ×1
LEAD CAPSURE NOVUS 45CM (Lead) ×3 IMPLANT
LEAD CAPSURE NOVUS 5076-58CM (Lead) ×3 IMPLANT
PAD DEFIB LIFELINK (PAD) ×3 IMPLANT
PPM ADVISA MRI DR A2DR01 (Pacemaker) ×3 IMPLANT
SET INTRODUCER MICROPUNCT 5F (INTRODUCER) ×6 IMPLANT
SHEATH CLASSIC 7F (SHEATH) ×6 IMPLANT
STRIP CLOSURE SKIN 1/2X4 (GAUZE/BANDAGES/DRESSINGS) ×2 IMPLANT
TRAY PACEMAKER INSERTION (PACKS) ×3 IMPLANT

## 2015-09-29 NOTE — Progress Notes (Signed)
  Echocardiogram 2D Echocardiogram has been performed.  Nicole Watkins 09/29/2015, 9:20 AM

## 2015-09-29 NOTE — Consult Note (Signed)
ELECTROPHYSIOLOGY CONSULT NOTE    Patient ID: Nicole Watkins MRN: [id number], DOB/AGE: [birth year] 75 y.o.  Admit date: 09/28/2015 Date of Consult: 09/29/2015  Primary Physician: Donato Schultz, DO Primary Cardiologist: [name] Electrophysiologist: Graciela Husbands Referring Physician: Mayford Knife  Reason for Consultation: complete heart block seen on ILR   HPI:  Nicole Watkins is a 75 y.o. female with a past medical history significant for CAD s/p DES to circ in 2015, HOCM, hyperlipidemia, and prior syncope with underlying LBBB.  She underwent ILR implantation in April of this year to further evaluate syncopal spells.  Over the weekend, she had an episode of dizziness that felt like the beginning of her typical spells.  Her ILR interrogation showed an episode of CHB with PVC's and she was advised to come to [facility] for further evaluation.  She is currently asymptomatic and denies chest pain, shortness of breath, recent fevers, chills, nausea or vomiting. She does have a significant component of anxiety.    Last echo 12/2014 demonstrated EF 50-55%, HOCM. LA mildly dilated.   She is on Metoprolol 25mg  bid at home and has been since stent placement.   Past Medical History:  Diagnosis Date  . Anxiety   . Arthritis   . CAD (coronary artery disease)   . GERD (gastroesophageal reflux disease)   . Hyperlipidemia   . Hypertension   . LBBB (left bundle branch block)   . Thyroid-related proptosis      Surgical History:  Past Surgical History:  Procedure Laterality Date  . CARDIAC SURGERY     stent placed 03/04/13  . EP IMPLANTABLE DEVICE N/A 06/17/2015   Procedure: Loop Recorder Insertion;  Surgeon: [name], MD;  Location: [facility] INVASIVE CV LAB;  Service: Cardiovascular;  Laterality: N/A;  . No family history       Prescriptions Prior to Admission  Medication Sig Dispense Refill Last Dose  . ALPRAZolam (XANAX) 0.25 MG tablet Take 1 tablet (0.25 mg total) by mouth at bedtime as needed for  anxiety. 30 tablet 1 09/27/2015 at Unknown time  . aspirin 81 MG tablet Take 81 mg by mouth every evening. Reported on 08/10/2015   09/28/2015 at Unknown time  . Cholecalciferol 4000 UNITS CAPS Take 4,000 Units by mouth every evening. Reported on 08/10/2015   09/27/2015 at Unknown time  . cyanocobalamin 2000 MCG tablet Take 500 mcg by mouth every evening. Reported on 08/10/2015   09/27/2015 at Unknown time  . docusate sodium (COLACE) 100 MG capsule Take 100 mg by mouth 2 (two) times daily. Reported on 08/10/2015   09/28/2015 at Unknown time  . lisinopril (PRINIVIL,ZESTRIL) 20 MG tablet Take 1 tablet (20 mg total) by mouth 2 (two) times daily. 180 tablet 3 09/28/2015 at Unknown time  . metoprolol tartrate (LOPRESSOR) 25 MG tablet Take 1 tablet (25 mg total) by mouth 2 (two) times daily. 180 tablet 3 09/28/2015 at 1000  . nitroGLYCERIN (NITROSTAT) 0.4 MG SL tablet Place 0.4 mg under the tongue every 5 (five) minutes as needed for chest pain. Reported on 08/10/2015   Never    Inpatient Medications:  . ALPRAZolam  0.125 mg Oral BID  . aspirin EC  81 mg Oral QPM  . aspirin EC  81 mg Oral Daily  . cholecalciferol  4,000 Units Oral QPM  . docusate sodium  100 mg Oral BID  . lisinopril  20 mg Oral BID  . metoprolol tartrate  25 mg Oral BID    Allergies:  Allergies  Allergen Reactions  .  Lipitor [Atorvastatin] Other (See Comments)    HA, depression, vomiting, headache  . Penicillins Anaphylaxis    Has patient had a PCN reaction causing immediate rash, facial/tongue/throat swelling, SOB or lightheadedness with hypotension: Yes Has patient had a PCN reaction causing severe rash involving mucus membranes or skin necrosis: No Has patient had a PCN reaction that required hospitalization No Has patient had a PCN reaction occurring within the last 10 years: No If all of the above answers are "NO", then may proceed with Cephalosporin use.   Gasper Lloyd [Rosuvastatin Calcium] Nausea And Vomiting    Depression,  heading    Social History   Social History  . Marital status: Widowed    Spouse name: N/A  . Number of children: 1  . Years of education: N/A   Occupational History  . Not on file.   Social History Main Topics  . Smoking status: Former Games developer  . Smokeless tobacco: Never Used  . Alcohol use No  . Drug use: Unknown  . Sexual activity: Not on file   Other Topics Concern  . Not on file   Social History Narrative  . No narrative on file     Family History  Problem Relation Age of Onset  . Arthritis Mother   . Hyperlipidemia Mother   . Hypertension Mother   . Hypothyroidism Mother   . Uterine cancer Maternal Grandmother      Review of Systems: All other systems reviewed and are otherwise negative except as noted above.  Physical Exam: Vitals:   09/29/15 0400 09/29/15 0500 09/29/15 0600 09/29/15 0700  BP: (!) 141/88 108/77 (!) 141/89 (!) 138/93  Pulse: 72 69 79 65  Resp: 14 17 16 14   Temp: 97.9 F (36.6 C)     TempSrc: Oral     SpO2: 94% 93% 96% 95%  Weight:      Height:        GEN- The patient is elderly and anxious appearing, alert and oriented x 3 today.   HEENT: normocephalic, atraumatic; sclera clear, conjunctiva pink; hearing intact; oropharynx clear; neck supple  Lungs- Clear to ausculation bilaterally, normal work of breathing.  No wheezes, rales, rhonchi Heart- Regular rate and rhythm  GI- soft, non-tender, non-distended, bowel sounds present  Extremities- no clubbing, cyanosis, or edema; DP/PT/radial pulses 2+ bilaterally MS- no significant deformity or atrophy Skin- warm and dry, no rash or lesion Psych- euthymic mood, full affect Neuro- strength and sensation are intact  Labs:   Lab Results  Component Value Date   WBC 8.1 09/28/2015   HGB 14.8 09/28/2015   HCT 46.2 (H) 09/28/2015   MCV 90.2 09/28/2015   PLT 272 09/28/2015    Recent Labs Lab 09/28/15 1849  NA 138  K 3.4*  CL 101  CO2 27  BUN 12  CREATININE 0.81  CALCIUM 10.0    GLUCOSE 111*    EKG:SR, LBBB, 1st degree AV block  TELEMETRY: sinus rhythm  DEVICE HISTORY: MDT ILR implanted 05/2015 for syncope by Dr Graciela Husbands   Assessment/Plan: 1.  Syncope with documented complete heart block by ILR The patient has had recurrent dizziness that is typical of the symptoms she has prior to frank syncope with documented CHB on ILR. She also has underlying conduction system disease with LBBB and 1st degree AV block.  While she is on Metoprolol, this is felt to be required long term for HOCM and CAD per Dr [name]. Risks, benefits to PPM implantation discussed with patient, daughter, and son-in-law  who wish to proceed. Will plan at the next available time.   2.  HOCM Echo 12/2014 stable Echo pending this admission Per Dr [name] requires beta blockers long term for management.  3.  CAD No recent ischemic symptoms BB on hold for now Continue ASA.  Intolerant of statins per allergies  4.  Anxiety Continue xanax as scheduled   [name] to see later today.   Signed, Gypsy Balsam, NP 09/29/2015 7:26 AM  I have seen, examined the patient, and reviewed the above assessment and plan.  On exam, RRR. 36 SEM LUSB.  Changes to above are made where necessary.    The patient has symptomatic transient complete heart block and syncope.   She requires beta blockers long term per Dr [name] due to HOCM and symptomatic gradient.  Per my discussion with Dr Graciela Husbands, she is not felt to warrant ICD given advanced age and clearly documented AV block as the cause for her syncope.   I would therefore recommend pacemaker implantation at this time.  We will plan to remove her implantable loop recorder after pacemaker implant.  Risks, benefits, alternatives to pacemaker implantation with ILR removal were discussed in detail with the patient today. The patient understands that the risks include but are not limited to bleeding, infection, pneumothorax, perforation, tamponade, vascular damage, renal  failure, MI, stroke, death,  and lead dislodgement and wishes to proceed. We will therefore schedule the procedure at the next available time.   Co Sign: [name], MD 09/29/2015 1:13 PM

## 2015-09-29 NOTE — Progress Notes (Addendum)
    Subjective:  Denies CP or dyspnea   Objective:  Vitals:   09/29/15 0500 09/29/15 0600 09/29/15 0700 09/29/15 0756  BP: 108/77 (!) 141/89 (!) 138/93   Pulse: 69 79 65   Resp: 17 16 14    Temp:    97.5 F (36.4 C)  TempSrc:    Oral  SpO2: 93% 96% 95%   Weight:      Height:        Intake/Output from previous day:  Intake/Output Summary (Last 24 hours) at 09/29/15 0800 Last data filed at 09/29/15 0200  Gross per 24 hour  Intake                0 ml  Output              500 ml  Net             -500 ml    Physical Exam: Physical exam: Well-developed well-nourished in no acute distress.  Skin is warm and dry.  HEENT is normal.  Neck is supple.  Chest is clear to auscultation with normal expansion.  Cardiovascular exam is regular rate and rhythm. 3/6 systolic murmur LSB Abdominal exam nontender or distended. No masses palpated. Extremities show no edema. neuro grossly intact    Lab Results: Basic Metabolic Panel:  Recent Labs  74/94/49 1849  NA 138  K 3.4*  CL 101  CO2 27  GLUCOSE 111*  BUN 12  CREATININE 0.81  CALCIUM 10.0  MG 2.2   CBC:  Recent Labs  09/28/15 1849  WBC 8.1  HGB 14.8  HCT 46.2*  MCV 90.2  PLT 272   Cardiac Enzymes:  Recent Labs  09/28/15 1849 09/29/15 0229  TROPONINI <0.03 <0.03     Assessment/Plan:  1 complete heart block-patient had a syncopal episode in April 2017. She had a near syncopal episode Saturday and review of her implantable loop monitor demonstrated transient complete heart block. Her baseline electrocardiogram shows sinus rhythm with first-degree AV block and left bundle branch block. She is on metoprolol 25 mg twice a day for history of coronary artery disease and hypertrophic obstructive cardiomyopathy. I have explained options today. One would be to discontinue metoprolol and follow for recurrent episodes of near syncope/complete heart block. If no recurrent episodes we could avoid pacemaker. However she  may develop worsening symptoms related to her HOCM if beta blocker discontinued. The second option would be to proceed with pacemaker now. We would be able to continue beta blocker for her HOCM. I also think that given baseline conduction disease she will likely require pacemaker in the future regardless of discontinuing beta-blockade. Patient would like to discuss this with her daughter before proceeding. 2 Hypokalemia-supplement 3 Coronary artery disease-continue aspirin. Intolerant to statins. 4 hypertension-continue lisinopril but hold metoprolol until decision concerning pacemaker made. Adjust regimen based on follow-up readings. 5 anxiety  Addendum: discussed with patient and daughter; preliminary echo shows severe SAM; feel pt will need beta blocker for HOCM to reduce symptoms and she agrees; will therefore proceed with pacemaker later today and they are in agreement.  Nicole Watkins 09/29/2015, 8:00 AM

## 2015-09-29 NOTE — Care Management Obs Status (Signed)
MEDICARE OBSERVATION STATUS NOTIFICATION   Patient Details  Name: Nicole Watkins MRN: [id number] Date of Birth: December 25, 1940   Medicare Observation Status Notification Given:  Yes    Hanley Hays, RN 09/29/2015, 12:43 PM

## 2015-09-29 NOTE — H&P (View-Only) (Signed)
 ELECTROPHYSIOLOGY CONSULT NOTE    Patient ID: Nicole Watkins MRN: [id number], DOB/AGE: 09/18/1940 75 y.o.  Admit date: 09/28/2015 Date of Consult: 09/29/2015  Primary Physician: [name], DO Primary Cardiologist: Crenshaw Electrophysiologist: Klein Referring Physician: Turner  Reason for Consultation: complete heart block seen on ILR   HPI:  Nicole Watkins is a 75 y.o. female with a past medical history significant for CAD s/p DES to circ in 2015, HOCM, hyperlipidemia, and prior syncope with underlying LBBB.  She underwent ILR implantation in April of this year to further evaluate syncopal spells.  Over the weekend, she had an episode of dizziness that felt like the beginning of her typical spells.  Her ILR interrogation showed an episode of CHB with PVC's and she was advised to come to [facility] for further evaluation.  She is currently asymptomatic and denies chest pain, shortness of breath, recent fevers, chills, nausea or vomiting. She does have a significant component of anxiety.    Last echo 12/2014 demonstrated EF 50-55%, HOCM. LA mildly dilated.   She is on Metoprolol 25mg bid at home and has been since stent placement.   Past Medical History:  Diagnosis Date  . Anxiety   . Arthritis   . CAD (coronary artery disease)   . GERD (gastroesophageal reflux disease)   . Hyperlipidemia   . Hypertension   . LBBB (left bundle branch block)   . Thyroid-related proptosis      Surgical History:  Past Surgical History:  Procedure Laterality Date  . CARDIAC SURGERY     stent placed 03/04/13  . EP IMPLANTABLE DEVICE N/A 06/17/2015   Procedure: Loop Recorder Insertion;  Surgeon: Steven C Klein, MD;  Location: [facility] INVASIVE [facility];  Service: Cardiovascular;  Laterality: N/A;  . No family history       Prescriptions Prior to Admission  Medication Sig Dispense Refill Last Dose  . ALPRAZolam (XANAX) 0.25 MG tablet Take 1 tablet (0.25 mg total) by mouth at bedtime as needed for  anxiety. 30 tablet 1 09/27/2015 at Unknown time  . aspirin 81 MG tablet Take 81 mg by mouth every evening. Reported on 08/10/2015   09/28/2015 at Unknown time  . Cholecalciferol 4000 UNITS CAPS Take 4,000 Units by mouth every evening. Reported on 08/10/2015   09/27/2015 at Unknown time  . cyanocobalamin 2000 MCG tablet Take 500 mcg by mouth every evening. Reported on 08/10/2015   09/27/2015 at Unknown time  . docusate sodium (COLACE) 100 MG capsule Take 100 mg by mouth 2 (two) times daily. Reported on 08/10/2015   09/28/2015 at Unknown time  . lisinopril (PRINIVIL,ZESTRIL) 20 MG tablet Take 1 tablet (20 mg total) by mouth 2 (two) times daily. 180 tablet 3 09/28/2015 at Unknown time  . metoprolol tartrate (LOPRESSOR) 25 MG tablet Take 1 tablet (25 mg total) by mouth 2 (two) times daily. 180 tablet 3 09/28/2015 at 1000  . nitroGLYCERIN (NITROSTAT) 0.4 MG SL tablet Place 0.4 mg under the tongue every 5 (five) minutes as needed for chest pain. Reported on 08/10/2015   Never    Inpatient Medications:  . ALPRAZolam  0.125 mg Oral BID  . aspirin EC  81 mg Oral QPM  . aspirin EC  81 mg Oral Daily  . cholecalciferol  4,000 Units Oral QPM  . docusate sodium  100 mg Oral BID  . lisinopril  20 mg Oral BID  . metoprolol tartrate  25 mg Oral BID    Allergies:  Allergies  Allergen Reactions  .   Lipitor [Atorvastatin] Other (See Comments)    HA, depression, vomiting, headache  . Penicillins Anaphylaxis    Has patient had a PCN reaction causing immediate rash, facial/tongue/throat swelling, SOB or lightheadedness with hypotension: Yes Has patient had a PCN reaction causing severe rash involving mucus membranes or skin necrosis: No Has patient had a PCN reaction that required hospitalization No Has patient had a PCN reaction occurring within the last 10 years: No If all of the above answers are "NO", then may proceed with Cephalosporin use.   . Crestor [Rosuvastatin Calcium] Nausea And Vomiting    Depression,  heading    Social History   Social History  . Marital status: Widowed    Spouse name: N/A  . Number of children: 1  . Years of education: N/A   Occupational History  . Not on file.   Social History Main Topics  . Smoking status: Former Smoker  . Smokeless tobacco: Never Used  . Alcohol use No  . Drug use: Unknown  . Sexual activity: Not on file   Other Topics Concern  . Not on file   Social History Narrative  . No narrative on file     Family History  Problem Relation Age of Onset  . Arthritis Mother   . Hyperlipidemia Mother   . Hypertension Mother   . Hypothyroidism Mother   . Uterine cancer Maternal Grandmother      Review of Systems: All other systems reviewed and are otherwise negative except as noted above.  Physical Exam: Vitals:   09/29/15 0400 09/29/15 0500 09/29/15 0600 09/29/15 0700  BP: (!) 141/88 108/77 (!) 141/89 (!) 138/93  Pulse: 72 69 79 65  Resp: 14 17 16 14  Temp: 97.9 F (36.6 C)     TempSrc: Oral     SpO2: 94% 93% 96% 95%  Weight:      Height:        GEN- The patient is elderly and anxious appearing, alert and oriented x 3 today.   HEENT: normocephalic, atraumatic; sclera clear, conjunctiva pink; hearing intact; oropharynx clear; neck supple  Lungs- Clear to ausculation bilaterally, normal work of breathing.  No wheezes, rales, rhonchi Heart- Regular rate and rhythm  GI- soft, non-tender, non-distended, bowel sounds present  Extremities- no clubbing, cyanosis, or edema; DP/PT/radial pulses 2+ bilaterally MS- no significant deformity or atrophy Skin- warm and dry, no rash or lesion Psych- euthymic mood, full affect Neuro- strength and sensation are intact  Labs:   Lab Results  Component Value Date   WBC 8.1 09/28/2015   HGB 14.8 09/28/2015   HCT 46.2 (H) 09/28/2015   MCV 90.2 09/28/2015   PLT 272 09/28/2015    Recent Labs Lab 09/28/15 1849  NA 138  K 3.4*  CL 101  CO2 27  BUN 12  CREATININE 0.81  CALCIUM 10.0    GLUCOSE 111*    EKG:SR, LBBB, 1st degree AV block  TELEMETRY: sinus rhythm  DEVICE HISTORY: MDT ILR implanted 05/2015 for syncope by Dr Klein   Assessment/Plan: 1.  Syncope with documented complete heart block by ILR The patient has had recurrent dizziness that is typical of the symptoms she has prior to frank syncope with documented CHB on ILR. She also has underlying conduction system disease with LBBB and 1st degree AV block.  While she is on Metoprolol, this is felt to be required long term for HOCM and CAD per Dr Crenshaw. Risks, benefits to PPM implantation discussed with patient, daughter, and son-in-law   who wish to proceed. Will plan at the next available time.   2.  HOCM Echo 12/2014 stable Echo pending this admission Per Dr Crenshaw requires beta blockers long term for management.  3.  CAD No recent ischemic symptoms BB on hold for now Continue ASA.  Intolerant of statins per allergies  4.  Anxiety Continue xanax as scheduled   Dr Makisha Marrin to see later today.   Signed, Amber Seiler, NP 09/29/2015 7:26 AM  I have seen, examined the patient, and reviewed the above assessment and plan.  On exam, RRR. 36 SEM LUSB.  Changes to above are made where necessary.    The patient has symptomatic transient complete heart block and syncope.   She requires beta blockers long term per Dr Crenshaw due to HOCM and symptomatic gradient.  Per my discussion with Dr Klein, she is not felt to warrant ICD given advanced age and clearly documented AV block as the cause for her syncope.   I would therefore recommend pacemaker implantation at this time.  We will plan to remove her implantable loop recorder after pacemaker implant.  Risks, benefits, alternatives to pacemaker implantation with ILR removal were discussed in detail with the patient today. The patient understands that the risks include but are not limited to bleeding, infection, pneumothorax, perforation, tamponade, vascular damage, renal  failure, MI, stroke, death,  and lead dislodgement and wishes to proceed. We will therefore schedule the procedure at the next available time.   Co Sign: Lauria Depoy, MD 09/29/2015 1:13 PM   

## 2015-09-29 NOTE — Interval H&P Note (Signed)
History and Physical Interval Note:  09/29/2015 1:17 PM  Nicole Watkins  has presented today for surgery, with the diagnosis of hb  The various methods of treatment have been discussed with the patient and family. After consideration of risks, benefits and other options for treatment, the patient has consented to  Procedure(s): Pacemaker Implant (N/A) as a surgical intervention .  The patient's history has been reviewed, patient examined, no change in status, stable for surgery.  I have reviewed the patient's chart and labs.  Questions were answered to the patient's satisfaction.     [name]

## 2015-09-30 ENCOUNTER — Inpatient Hospital Stay (HOSPITAL_COMMUNITY): Payer: Medicare Other

## 2015-09-30 ENCOUNTER — Encounter (HOSPITAL_COMMUNITY): Payer: Self-pay | Admitting: Internal Medicine

## 2015-09-30 DIAGNOSIS — J9311 Primary spontaneous pneumothorax: Secondary | ICD-10-CM

## 2015-09-30 LAB — BASIC METABOLIC PANEL
ANION GAP: 7 (ref 5–15)
BUN: 20 mg/dL (ref 6–20)
CALCIUM: 9.1 mg/dL (ref 8.9–10.3)
CO2: 23 mmol/L (ref 22–32)
CREATININE: 1.1 mg/dL — AB (ref 0.44–1.00)
Chloride: 104 mmol/L (ref 101–111)
GFR calc Af Amer: 55 mL/min — ABNORMAL LOW (ref >60)
GFR, EST NON AFRICAN AMERICAN: 48 mL/min — AB (ref >60)
GLUCOSE: 154 mg/dL — AB (ref 65–99)
Potassium: 4.3 mmol/L (ref 3.5–5.1)
Sodium: 134 mmol/L — ABNORMAL LOW (ref 135–145)

## 2015-09-30 MED ORDER — CALCIUM CARBONATE ANTACID 500 MG PO CHEW
1.0000 | CHEWABLE_TABLET | Freq: Two times a day (BID) | ORAL | Status: DC | PRN
  Administered 2015-10-01 – 2015-10-03 (×3): 200 mg via ORAL
  Filled 2015-09-30 (×3): qty 1

## 2015-09-30 MED ORDER — LIDOCAINE HCL (PF) 1 % IJ SOLN
INTRAMUSCULAR | Status: AC
  Filled 2015-09-30: qty 5

## 2015-09-30 MED ORDER — LIDOCAINE HCL (PF) 1 % IJ SOLN
INTRAMUSCULAR | Status: AC
  Administered 2015-09-30: 10 mL
  Filled 2015-09-30: qty 15

## 2015-09-30 MED FILL — Fentanyl Citrate Preservative Free (PF) Inj 100 MCG/2ML: INTRAMUSCULAR | Qty: 2 | Status: AC

## 2015-09-30 NOTE — Progress Notes (Signed)
Patient on hi-flow nasal cannula at 3 L and oxygen levels 99-100%.  Spoke with respiratory therapy and transitioning patient to regular nasal cannula starting at 5 L.

## 2015-09-30 NOTE — Progress Notes (Signed)
SUBJECTIVE: The patient is doing well today.  At this time, she denies chest pain.  Moderate incisional pain.  Shortness of breath stable on O2.    CURRENT MEDICATIONS: . ALPRAZolam  0.125 mg Oral BID  . antiseptic oral rinse  7 mL Mouth Rinse BID  . cholecalciferol  4,000 Units Oral QPM  . docusate sodium  100 mg Oral BID  . lisinopril  20 mg Oral BID  . sodium chloride flush  3 mL Intravenous Q12H      OBJECTIVE: Physical Exam: Vitals:   09/30/15 0300 09/30/15 0413 09/30/15 0532 09/30/15 0651  BP:   (!) 80/59   Pulse: 69  73 71  Resp: Temp:  98 F (36.7 C)    TempSrc:  Oral    SpO2: 100%  100% 99%  Weight:      Height:        Intake/Output Summary (Last 24 hours) at 09/30/15 0704 Last data filed at 09/30/15 0608  Gross per 24 hour  Intake           638.33 ml  Output             1625 ml  Net          -986.67 ml    Telemetry reveals sinus rhythm  GEN- The patient is elderly and thin appearing, alert and oriented x 3 today.   Head- normocephalic, atraumatic Eyes-  Sclera clear, conjunctiva pink Ears- hearing intact Oropharynx- clear Neck- supple  Lungs- normal work of breathing, absent breath sounds left upper lobe  Heart- Regular rate and rhythm  GI- soft, NT, ND, + BS Extremities- no clubbing, cyanosis, or edema Skin- no rash or lesion Psych- euthymic mood, full affect Neuro- strength and sensation are intact  LABS: Basic Metabolic Panel:  Recent Labs  16/10/96 1849 09/29/15 0756 09/30/15 0309  NA 138 137 134*  K 3.4* 3.3* 4.3  CL 101 102 104  CO2 GLUCOSE 111* 109* 154*  BUN CREATININE 0.81 0.82 1.10*  CALCIUM 10.0 9.6 9.1  MG 2.2  --   --    CBC:  Recent Labs  09/28/15 1849 09/29/15 0756  WBC 8.1 8.4  HGB 14.8 14.5  HCT 46.2* 45.3  MCV 90.2 91.0  PLT 272 252   Cardiac Enzymes:  Recent Labs  09/28/15 1849 09/29/15 0229 09/29/15 0756  TROPONINI <0.03 <0.03 <0.03   Thyroid Function  Tests:  Recent Labs  09/28/15 2223  TSH 2.843    ASSESSMENT AND PLAN:  Active Problems:   Bradycardia   Dizziness   Complete heart block (HCC)   Coronary artery disease involving native coronary artery of native heart without angina pectoris   CHB (complete heart block) (HCC)    1.  Transient complete heart block with syncope S/p PPM CXR this morning with leads in stable position, +ptx  Device interrogation pending  2.  Pneumothorax I think she will require chest tube to resolve She would like for [place] to discuss with daughter first  3.  HOCM Stable by echo this admission  4.  CAD No recent ischemic symptoms   Gypsy Balsam, NP 09/30/2015 7:07 AM  I have seen, examined the patient, and reviewed the above assessment and plan. On exam, decreased BS on L.  Device interrogation is reviewed and normal.  Large ptx on CXR.  Changes to above are made where necessary.  Will likely require chest  tube.  Pt wishes for [place] to discuss with daughter first.  Co Sign: [name], MD 09/30/2015 7:28 AM

## 2015-09-30 NOTE — Progress Notes (Signed)
Patient ID: Nicole Watkins, female   DOB: [birth year], 75 y.o.   MRN: [id number]      [address]       [place] [address]             [phone]        [facility] Medical Record #[id number] Date of Birth: 03-31-1940  Referring: No ref. provider found Primary Care: Donato Schultz, DO  Chief Complaint:    Chief Complaint  Patient presents with  . AICD Problem    Loop recorder event    History of Present Illness:     Patient adimtted for CHB, she had  a syncopal episode in April 2017. She had a near syncopal episode several days ago , review of her implantable loop monitor demonstrated transient complete heart block. Her baseline electrocardiogram shows sinus rhythm with first-degree AV block and left bundle branch block. She is on metoprolol 25 mg twice a day for history of coronary artery disease and hypertrophic obstructive cardiomyopathy.  Patient has significant kyphosis and hyper expanded upper lungs. A pacer was placed yesterday. Patient complained of left chest pain, increasing SOB, decrease sats were noted. Xray this am shows 40-50 % PTX on the left.   Current Activity/ Functional Status: Patient is independent with mobility/ambulation, transfers, ADL's, IADL's.   Zubrod Score: At the time of surgery this patient's most appropriate activity status/level should be described as: []     0    Normal activity, no symptoms [x]     1    Restricted in physical strenuous activity but ambulatory, able to do out light work []     2    Ambulatory and capable of self care, unable to do work activities, up and about                 more than 50%  Of the time                            []     3    Only limited self care, in bed greater than 50% of waking hours []     4    Completely disabled, no self care, confined to bed or chair []     5    Moribund  Past Medical History:  Diagnosis Date  . Anxiety   . Arthritis   . CAD (coronary artery disease)   .  GERD (gastroesophageal reflux disease)   . Hyperlipidemia   . Hypertension   . LBBB (left bundle branch block)   . Thyroid-related proptosis     Past Surgical History:  Procedure Laterality Date  . CARDIAC SURGERY     stent placed 03/04/13  . EP IMPLANTABLE DEVICE N/A 06/17/2015   Procedure: Loop Recorder Insertion;  Surgeon: [name], MD;  Location: [facility] INVASIVE CV LAB;  Service: Cardiovascular;  Laterality: N/A;  . EP IMPLANTABLE DEVICE N/A 09/29/2015   Procedure: Pacemaker Implant;  Surgeon: [name], MD;  Location: [facility] INVASIVE CV LAB;  Service: Cardiovascular;  Laterality: N/A;  . No family history      History  Smoking Status  . Former Smoker  Smokeless Tobacco  . Never Used    History  Alcohol Use No    Social History   Social History  . Marital status: Widowed    Spouse name: N/A  . Number of children: 1  . Years of education: N/A   Occupational  History  . Not on file.   Social History Main Topics  . Smoking status: Former Games developer  . Smokeless tobacco: Never Used  . Alcohol use No  . Drug use: Unknown  . Sexual activity: Not on file   Other Topics Concern  . Not on file   Social History Narrative  . No narrative on file    Allergies  Allergen Reactions  . Lipitor [Atorvastatin] Other (See Comments)    HA, depression, vomiting, headache  . Penicillins Anaphylaxis    Has patient had a PCN reaction causing immediate rash, facial/tongue/throat swelling, SOB or lightheadedness with hypotension: Yes Has patient had a PCN reaction causing severe rash involving mucus membranes or skin necrosis: No Has patient had a PCN reaction that required hospitalization No Has patient had a PCN reaction occurring within the last 10 years: No If all of the above answers are "NO", then may proceed with Cephalosporin use.   . Ambien [Zolpidem Tartrate] Other (See Comments)    Per patient's daughter-patient becomes delirious  . Crestor [Rosuvastatin Calcium] Nausea  And Vomiting    Depression, heading    Current Facility-Administered Medications  Medication Dose Route Frequency Provider Last Rate Last Dose  . 0.9 %  sodium chloride infusion  250 mL Intravenous PRN [name], MD      . acetaminophen (TYLENOL) tablet 325-650 mg  325-650 mg Oral Q4H PRN [name], MD      . ALPRAZolam Prudy Feeler) tablet 0.125 mg  0.125 mg Oral BID Little Ishikawa, NP   0.125 mg at 09/29/15 2140  . ALPRAZolam Prudy Feeler) tablet 0.125 mg  0.125 mg Oral BID PRN [name], MD   0.125 mg at 09/30/15 1052  . antiseptic oral rinse (CPC / CETYLPYRIDINIUM CHLORIDE 0.05%) solution 7 mL  7 mL Mouth Rinse BID Lewayne Bunting, MD   7 mL at 09/30/15 1000  . cholecalciferol (VITAMIN D) tablet 4,000 Units  4,000 Units Oral QPM Little Ishikawa, NP   4,000 Units at 09/29/15 1730  . docusate sodium (COLACE) capsule 100 mg  100 mg Oral BID Little Ishikawa, NP   100 mg at 09/30/15 8891  . hydrALAZINE (APRESOLINE) tablet 10 mg  10 mg Oral PRN Rollene Rotunda, MD   10 mg at 09/29/15 1728  . HYDROcodone-acetaminophen (NORCO/VICODIN) 5-325 MG per tablet 1-2 tablet  1-2 tablet Oral Q4H PRN [name], MD   1 tablet at 09/30/15 1051  . lidocaine (PF) (XYLOCAINE) 1 % injection           . lidocaine (PF) (XYLOCAINE) 1 % injection           . lisinopril (PRINIVIL,ZESTRIL) tablet 20 mg  20 mg Oral BID Little Ishikawa, NP   20 mg at 09/30/15 0917  . ondansetron (ZOFRAN) injection 4 mg  4 mg Intravenous Q6H PRN [name], MD      . sodium chloride flush (NS) 0.9 % injection 3 mL  3 mL Intravenous Q12H [name], MD   3 mL at 09/30/15 0918  . sodium chloride flush (NS) 0.9 % injection 3 mL  3 mL Intravenous PRN [name], MD        Prescriptions Prior to Admission  Medication Sig Dispense Refill Last Dose  . ALPRAZolam (XANAX) 0.25 MG tablet Take 1 tablet (0.25 mg total) by mouth at bedtime as needed for anxiety. 30 tablet 1 09/27/2015 at Unknown time  . aspirin 81 MG tablet Take 81 mg by mouth every  evening. Reported on 08/10/2015   09/28/2015 at Unknown time  . Cholecalciferol 4000 UNITS CAPS Take 4,000 Units by mouth every evening. Reported on 08/10/2015   09/27/2015 at Unknown time  . cyanocobalamin 2000 MCG tablet Take 500 mcg by mouth every evening. Reported on 08/10/2015   09/27/2015 at Unknown time  . docusate sodium (COLACE) 100 MG capsule Take 100 mg by mouth 2 (two) times daily. Reported on 08/10/2015   09/28/2015 at Unknown time  . lisinopril (PRINIVIL,ZESTRIL) 20 MG tablet Take 1 tablet (20 mg total) by mouth 2 (two) times daily. 180 tablet 3 09/28/2015 at Unknown time  . metoprolol tartrate (LOPRESSOR) 25 MG tablet Take 1 tablet (25 mg total) by mouth 2 (two) times daily. 180 tablet 3 09/28/2015 at 1000  . nitroGLYCERIN (NITROSTAT) 0.4 MG SL tablet Place 0.4 mg under the tongue every 5 (five) minutes as needed for chest pain. Reported on 08/10/2015   Never    Family History  Problem Relation Age of Onset  . Arthritis Mother   . Hyperlipidemia Mother   . Hypertension Mother   . Hypothyroidism Mother   . Uterine cancer Maternal Grandmother      Review of Systems:      Cardiac Review of Systems: Y or N  Chest Pain [ y   ]  Resting SOB [  y ] Exertional SOB  [ y ]  Pollyann Kennedy Cove.Etienne  ]   Pedal Edema [  n ]    Palpitations Cove.Etienne  ] Syncope  Cove.Etienne  ]   Presyncope [  y ]  General Review of Systems: [Y] = yes [  ]=no Constitional: recent weight change [  ]; anorexia [  ]; fatigue [  ]; nausea [  ]; night sweats [  ]; fever [  ]; or chills [  ]                                                               Dental: poor dentition[  ]; Last Dentist visit:   Eye : blurred vision [  ]; diplopia [   ]; vision changes [  ];  Amaurosis fugax[  ]; Resp: cough [  ];  wheezing[  ];  hemoptysis[  ]; shortness of breath[  ]; paroxysmal nocturnal dyspnea[  ]; dyspnea on exertion[  ]; or orthopnea[  ];  GI:  gallstones[  ], vomiting[  ];  dysphagia[  ]; melena[  ];  hematochezia [  ]; heartburn[  ];   Hx of   Colonoscopy[  ]; GU: kidney stones [  ]; hematuria[  ];   dysuria [  ];  nocturia[  ];  history of     obstruction [  ]; urinary frequency [  ]             Skin: rash, swelling[  ];, hair loss[  ];  peripheral edema[  ];  or itching[  ]; Musculosketetal: myalgias[  ];  joint swelling[  ];  joint erythema[  ];  joint pain[  ];  back pain[  ];  Heme/Lymph: bruising[  ];  bleeding[  ];  anemia[  ];  Neuro: TIA[  ];  headaches[  ];  stroke[  ];  vertigo[  ];  seizures[  ];  paresthesias[  ];  difficulty walking[  ];  Psych:depression[  ]; anxiety[  ];  Endocrine: diabetes[  ];  thyroid dysfunction[  ];  Immunizations: Flu [  ]; Pneumococcal[  ];  Other:  Physical Exam: BP 117/75   Pulse 67   Temp 97.5 F (36.4 C) (Oral)   Resp 15   Ht 5\' 4"  (1.626 m)   Wt 125 lb 14.1 oz (57.1 kg)   SpO2 100%   BMI 21.61 kg/m    General appearance: alert, cooperative, appears stated age and no distress Head: Normocephalic, without obvious abnormality, atraumatic Neck: no adenopathy, no carotid bruit, no JVD, supple, symmetrical, trachea midline and thyroid not enlarged, symmetric, no tenderness/mass/nodules Lymph nodes: Cervical, supraclavicular, and axillary nodes normal. Resp: diminished breath sounds LLL and LUL Back: symmetric, no curvature. ROM normal. No CVA tenderness. Cardio: systolic murmur: early systolic 3/6, crescendo at lower left sternal border GI: soft, non-tender; bowel sounds normal; no masses,  no organomegaly Extremities: extremities normal, atraumatic, no cyanosis or edema and Homans sign is negative, no sign of DVT Neurologic: Grossly normal  Diagnostic Studies & Laboratory data:     Recent Radiology Findings:   Dg Chest 2 View  Result Date: 09/30/2015 CLINICAL DATA:  Soreness following pacemaker placement. Also shortness of breath. EXAM: CHEST  2 VIEW COMPARISON:  PA and lateral chest x-ray of June 16, 2015 FINDINGS: The patient has sustained at approximately 50-60%  left-sided pneumothorax since pacemaker placement. There is mild shift of the mediastinum toward the right. The right lung is hyperinflated and exhibits mild interstitial prominence in the upper lobe which is stable. The heart and pulmonary vascularity are normal. There is calcification in the wall of the aortic arch. The permanent pacemaker electrodes are in reasonable position. The bony thorax is unremarkable. IMPRESSION: 1. New 50% left-sided pneumothorax. Mild mediastinal shift toward the right. Critical Value/emergent results were called by telephone at the time of interpretation on 09/30/2015 at 7:59 am to Edd Fabian, RN,, who verbally acknowledged these results. 2. Underlying COPD.  No CHF.  Aortic atherosclerosis. Electronically Signed   By: David  [place] M.D.   On: 09/30/2015 08:03     I have independently reviewed the above radiologic studies.  Recent Lab Findings: Lab Results  Component Value Date   WBC 8.4 09/29/2015   HGB 14.5 09/29/2015   HCT 45.3 09/29/2015   PLT 252 09/29/2015   GLUCOSE 154 (H) 09/30/2015   CHOL 204 (H) 07/07/2015   TRIG 89.0 07/07/2015   HDL 61.30 07/07/2015   LDLCALC 125 (H) 07/07/2015   ALT 12 07/07/2015   AST 18 07/07/2015   NA 134 (L) 09/30/2015   K 4.3 09/30/2015   CL 104 09/30/2015   CREATININE 1.10 (H) 09/30/2015   BUN 20 09/30/2015   CO2 23 09/30/2015   TSH 2.843 09/28/2015   INR 1.09 09/29/2015      Assessment / Plan:     I have recommended urgent placement of left chest tube, the patient and daughter were told that they would repeat chest xray ant noon and have become fixated on a repeat chest xray. Need for chest tube explained in detail, will accommodate patient desire and get repeat chest xray now . If same or worse place chest tube immediately.   The goals risks and alternatives of the planned surgical procedure left chest tube placement   have been discussed with the patient in detail. The risks of the procedure including death,  infection, stroke, myocardial infarction, bleeding,  blood transfusion have all been discussed specifically.  I have quoted Valene Bors a 1% of perioperative mortality and a complication rate as high as 20 %. The patient's questions have been answered.Sascha Palma is willing  to proceed with the planned procedure.   I  spent 30 minutes counseling the patient face to face and 50% or more the  time was spent in counseling and coordination of care. The total time spent in the appointment was 60 minutes.    Delight Ovens MD      [address] [place] [place] [address] Office [phone]   Beeper [phone]  09/30/2015 10:54 AM

## 2015-09-30 NOTE — Procedures (Signed)
      [address]       [name] [address]             [phone]      Chest Tube Insertion Procedure Note  Indications:  Clinically significant Pneumothorax after pacer insertion  Pre-operative Diagnosis: Pneumothorax  Post-operative Diagnosis: Pneumothorax  Procedure Details  Informed consent was obtained for the procedure, including sedation.  Risks of lung perforation, hemorrhage, arrhythmia, and adverse drug reaction were discussed.   After sterile skin prep, using standard technique, a 20 French tube was placed in the left lateral 6 rib space.  Findings: Air rush , no fluid drained   Estimated Blood Loss:  Minimal         Specimens:  None              Complications:  None; patient tolerated the procedure well.         Disposition: ICU - extubated and stable.         Condition: stable  Attending Attestation: I performed the procedure.

## 2015-09-30 NOTE — Progress Notes (Addendum)
Discussed with daughter. She understands that pt will likely require chest tube. I have called [facility] to help evaluate.  Gypsy Balsam, NP 09/30/2015 9:30 AM  [name] MD, [facility] 09/30/2015 1:37 PM

## 2015-10-01 ENCOUNTER — Inpatient Hospital Stay (HOSPITAL_COMMUNITY): Payer: Medicare Other

## 2015-10-01 MED ORDER — PANTOPRAZOLE SODIUM 40 MG PO TBEC
40.0000 mg | DELAYED_RELEASE_TABLET | Freq: Every day | ORAL | Status: DC
  Administered 2015-10-01 – 2015-10-03 (×3): 40 mg via ORAL
  Filled 2015-10-01 (×3): qty 1

## 2015-10-01 MED ORDER — METOPROLOL TARTRATE 25 MG PO TABS
25.0000 mg | ORAL_TABLET | Freq: Two times a day (BID) | ORAL | Status: DC
  Administered 2015-10-01 – 2015-10-03 (×4): 25 mg via ORAL
  Filled 2015-10-01 (×4): qty 1

## 2015-10-01 NOTE — Care Management Important Message (Signed)
Important Message  Patient Details  Name: Nicole Watkins MRN: [id number] Date of Birth: 1940-12-29   Medicare Important Message Given:  Yes    Bernadette Hoit 10/01/2015, 10:45 AM

## 2015-10-01 NOTE — Progress Notes (Addendum)
[address]       [place] [facility] [address]             [phone]      2 Days Post-Op Procedure(s) (LRB): Pacemaker Implant (N/A) Subjective: Feeling ok, not SOB only requiring 2 l Walker currently  Objective: Vital signs in last 24 hours: Temp:  [97.4 F (36.3 C)-98 F (36.7 C)] 97.4 F (36.3 C) (08/03 0816) Pulse Rate:  [65-103] 77 (08/03 0816) Cardiac Rhythm: Heart block (08/03 0000) Resp:  [11-20] 16 (08/03 0816) BP: (82-139)/(54-89) 109/74 (08/03 0816) SpO2:  [96 %-100 %] 97 % (08/03 0816)  Hemodynamic parameters for last 24 hours:    Intake/Output from previous day: 08/02 0701 - 08/03 0700 In: 240 [P.O.:240] Out: 450 [Urine:450] Intake/Output this shift: No intake/output data recorded.  General appearance: alert, cooperative and no distress Heart: regular rate and rhythm and 3/6 syst murmur Lungs: clear to auscultation bilaterally  Lab Results:  Recent Labs  09/28/15 1849 09/29/15 0756  WBC 8.1 8.4  HGB 14.8 14.5  HCT 46.2* 45.3  PLT 272 252   BMET:  Recent Labs  09/29/15 0756 09/30/15 0309  NA 137 134*  K 3.3* 4.3  CL 102 104  CO2 28 23  GLUCOSE 109* 154*  BUN 12 20  CREATININE 0.82 1.10*  CALCIUM 9.6 9.1    PT/INR:  Recent Labs  09/29/15 0756  LABPROT 14.1  INR 1.09   ABG    Component Value Date/Time   TCO2 27 06/16/2015 2239   CBG (last 3)  No results for input(s): GLUCAP in the last 72 hours.  Meds Scheduled Meds: . ALPRAZolam  0.125 mg Oral BID  . antiseptic oral rinse  7 mL Mouth Rinse BID  . cholecalciferol  4,000 Units Oral QPM  . docusate sodium  100 mg Oral BID  . lisinopril  20 mg Oral BID  . sodium chloride flush  3 mL Intravenous Q12H   Continuous Infusions:  PRN Meds:.sodium chloride, acetaminophen, ALPRAZolam, calcium carbonate, hydrALAZINE, HYDROcodone-acetaminophen, ondansetron (ZOFRAN) IV, sodium chloride flush  Xrays Dg Chest 2 View  Result Date: 09/30/2015 CLINICAL DATA:  Soreness  following pacemaker placement. Also shortness of breath. EXAM: CHEST  2 VIEW COMPARISON:  PA and lateral chest x-ray of June 16, 2015 FINDINGS: The patient has sustained at approximately 50-60% left-sided pneumothorax since pacemaker placement. There is mild shift of the mediastinum toward the right. The right lung is hyperinflated and exhibits mild interstitial prominence in the upper lobe which is stable. The heart and pulmonary vascularity are normal. There is calcification in the wall of the aortic arch. The permanent pacemaker electrodes are in reasonable position. The bony thorax is unremarkable. IMPRESSION: 1. New 50% left-sided pneumothorax. Mild mediastinal shift toward the right. Critical Value/emergent results were called by telephone at the time of interpretation on 09/30/2015 at 7:59 am to Edd Fabian, RN,, who verbally acknowledged these results. 2. Underlying COPD.  No CHF.  Aortic atherosclerosis. Electronically Signed   By: David  [place] M.D.   On: 09/30/2015 08:03   Dg Chest Port 1 View  Result Date: 10/01/2015 CLINICAL DATA:  Chest tube placement. EXAM: PORTABLE CHEST 1 VIEW COMPARISON:  Radiograph of September 30, 2015. FINDINGS: The heart size and mediastinal contours are within normal limits. Atherosclerosis of thoracic aorta is noted. Left-sided chest tube is unchanged in position without evidence of pneumothorax. Left-sided pacemaker is unchanged in position. No acute pulmonary disease is noted. No significant pleural effusion  is noted. The visualized skeletal structures are unremarkable. IMPRESSION: Aortic atherosclerosis. Left-sided chest tube is unchanged in position without evidence of pneumothorax. Electronically Signed   By: Lupita Raider, M.D.   On: 10/01/2015 08:09   Dg Chest Port 1 View  Result Date: 09/30/2015 CLINICAL DATA:  Status post chest tube placement EXAM: PORTABLE CHEST 1 VIEW COMPARISON:  09/30/2015 FINDINGS: New left-sided chest tube has been placed. The previously  seen pneumothorax is no longer present Right lung remains clear. Pacemaker is again seen. No bony abnormality is seen. IMPRESSION: Resolution of pneumothorax on the left following chest tube placement. Electronically Signed   By: Alcide Clever M.D.   On: 09/30/2015 12:05   Dg Chest Port 1 View  Result Date: 09/30/2015 CLINICAL DATA:  Continued surveillance of a pneumothorax. Chest soreness after pacemaker placement. EXAM: PORTABLE CHEST 1 VIEW COMPARISON:  Portable film earlier in the day. FINDINGS: Large LEFT pneumothorax persists, and least 50-60%. Minimal LEFT-to-RIGHT shift but not increased. Hyperinflated RIGHT lung without infiltrates. Normal heart and pulmonary vascularity. Atrial and ventricular leads of the pacemaker are stable. Bony thorax unremarkable. No significant effusion or hemothorax. IMPRESSION: Stable 50%- 60% LEFT pneumothorax. Minimal LEFT-to-RIGHT shift has not progressed from priors. Electronically Signed   By: Elsie Stain M.D.   On: 09/30/2015 11:51    Assessment/Plan: S/P Procedure(s) (LRB): Pacemaker Implant (N/A)  1 chest tube - no air leak, CXR- no pntx- decrease suction to 10 2 repeat CXR in am   LOS: 2 days    Nicole Watkins,Nicole Watkins 10/01/2015  Chest tube to water seal, chest xray in am Can go to 2w  I have seen and examined Nicole Watkins and agree with the above assessment  and plan.  Delight Ovens MD Beeper [phone] Office [phone] 10/01/2015 8:51 AM

## 2015-10-01 NOTE — Progress Notes (Signed)
Patient's HR was in the 140's-160's. Physician has been made aware.

## 2015-10-01 NOTE — Progress Notes (Addendum)
    SUBJECTIVE: The patient is doing well today.  SOB is better, mild pain at chest tube site.   CURRENT MEDICATIONS: . ALPRAZolam  0.125 mg Oral BID  . antiseptic oral rinse  7 mL Mouth Rinse BID  . cholecalciferol  4,000 Units Oral QPM  . docusate sodium  100 mg Oral BID  . lisinopril  20 mg Oral BID  . sodium chloride flush  3 mL Intravenous Q12H      OBJECTIVE: Physical Exam: Vitals:   10/01/15 0400 10/01/15 0500 10/01/15 0600 10/01/15 0700  BP: 98/62 113/66 100/65 112/70  Pulse: 69 67 74 87  Resp: 18 11 12 17   Temp:      TempSrc:      SpO2: 98% 99% 96% 100%  Weight:      Height:        Intake/Output Summary (Last 24 hours) at 10/01/15 0810 Last data filed at 10/01/15 0300  Gross per 24 hour  Intake              120 ml  Output              450 ml  Net             -330 ml    Telemetry reveals sinus rhythm  GEN- The patient is elderly and thin appearing, alert and oriented x 3 today.   Head- normocephalic, atraumatic Eyes-  Sclera clear, conjunctiva pink Ears- hearing intact Oropharynx- clear Neck- supple  Lungs- normal work of breathing, chest tube in place Heart- Regular rate and rhythm  GI- soft, NT, ND, + BS Extremities- no clubbing, cyanosis, or edema Skin- no rash or lesion Psych- euthymic mood, full affect Neuro- strength and sensation are intact  LABS: Basic Metabolic Panel:  Recent Labs  98/33/82 1849 09/29/15 0756 09/30/15 0309  NA 138 137 134*  K 3.4* 3.3* 4.3  CL 101 102 104  CO2 27 28 23   GLUCOSE 111* 109* 154*  BUN 12 12 20   CREATININE 0.81 0.82 1.10*  CALCIUM 10.0 9.6 9.1  MG 2.2  --   --    CBC:  Recent Labs  09/28/15 1849 09/29/15 0756  WBC 8.1 8.4  HGB 14.8 14.5  HCT 46.2* 45.3  MCV 90.2 91.0  PLT 272 252   Cardiac Enzymes:  Recent Labs  09/28/15 1849 09/29/15 0229 09/29/15 0756  TROPONINI <0.03 <0.03 <0.03   Thyroid Function Tests:  Recent Labs  09/28/15 2223  TSH 2.843    ASSESSMENT AND PLAN:     1.  Transient complete heart block with syncope S/p PPM Doing well  2.  Pneumothorax Appreciate Dr Dennie Maizes assistance  3.  HOCM Stable by echo this admission  4.  CAD No recent ischemic symptoms   Proph:  SCDs Would add lovenox if ok with Dr Tyrone Sage Can also transfer to 2w if ok with Dr Barbaraann Rondo MD, [facility] 10/01/2015 8:11 AM

## 2015-10-02 ENCOUNTER — Encounter: Payer: Self-pay | Admitting: Internal Medicine

## 2015-10-02 ENCOUNTER — Inpatient Hospital Stay (HOSPITAL_COMMUNITY): Payer: Medicare Other

## 2015-10-02 LAB — BASIC METABOLIC PANEL
ANION GAP: 7 (ref 5–15)
BUN: 21 mg/dL — ABNORMAL HIGH (ref 6–20)
CALCIUM: 9.1 mg/dL (ref 8.9–10.3)
CO2: 26 mmol/L (ref 22–32)
Chloride: 97 mmol/L — ABNORMAL LOW (ref 101–111)
Creatinine, Ser: 0.9 mg/dL (ref 0.44–1.00)
GFR calc Af Amer: >60 mL/min (ref >60)
GLUCOSE: 116 mg/dL — AB (ref 65–99)
POTASSIUM: 4.3 mmol/L (ref 3.5–5.1)
Sodium: 130 mmol/L — ABNORMAL LOW (ref 135–145)

## 2015-10-02 LAB — CBC
HEMATOCRIT: 40.1 % (ref 36.0–46.0)
HEMOGLOBIN: 13 g/dL (ref 12.0–15.0)
MCH: 28.9 pg (ref 26.0–34.0)
MCHC: 32.4 g/dL (ref 30.0–36.0)
MCV: 89.1 fL (ref 78.0–100.0)
Platelets: 214 10*3/uL (ref 150–400)
RBC: 4.5 MIL/uL (ref 3.87–5.11)
RDW: 14.2 % (ref 11.5–15.5)
WBC: 8.6 10*3/uL (ref 4.0–10.5)

## 2015-10-02 NOTE — Progress Notes (Addendum)
      [address]       [place] [address]             [phone]      3 Days Post-Op Procedure(s) (LRB): Pacemaker Implant (N/A)   Subjective:  Patient has pain at chest tube site.  Otherwise has no complaints.  Objective: Vital signs in last 24 hours: Temp:  [97.3 F (36.3 C)-98.4 F (36.9 C)] 98.1 F (36.7 C) (08/04 0448) Pulse Rate:  [73-143] 80 (08/04 0448) Cardiac Rhythm: Heart block;Normal sinus rhythm;Bundle branch block (08/04 0700) Resp:  [17-25] 18 (08/04 0448) BP: (93-127)/(57-97) 96/57 (08/04 0448) SpO2:  [95 %-100 %] 95 % (08/04 0448)  Intake/Output from previous day: 08/03 0701 - 08/04 0700 In: 315 [P.O.:240] Out: 1024 [Urine:1000; Chest Tube:24]  General appearance: alert, cooperative and no distress Heart: regular rate and rhythm Lungs: clear to auscultation bilaterally Abdomen: soft, non-tender; bowel sounds normal; no masses,  no organomegaly Wound: clean and dry  Lab Results:  Recent Labs  10/02/15 0315  WBC 8.6  HGB 13.0  HCT 40.1  PLT 214   BMET:  Recent Labs  09/30/15 0309 10/02/15 0315  NA 134* 130*  K 4.3 4.3  CL 104 97*  CO2 23 26  GLUCOSE 154* 116*  BUN 20 21*  CREATININE 1.10* 0.90  CALCIUM 9.1 9.1    PT/INR: No results for input(s): LABPROT, INR in the last 72 hours. ABG    Component Value Date/Time   TCO2 27 06/16/2015 2239   CBG (last 3)  No results for input(s): GLUCAP in the last 72 hours.  Assessment/Plan: S/P Procedure(s) (LRB): Pacemaker Implant (N/A)  1. Chest tube- no air leak present, CXR shows re-expansion of lung without pneumothorax 2. Dispo- d/c chest tube today, care per EP   LOS: 3 days    Nicole Watkins 10/02/2015  Chest tube out today Poss home in am I have seen and examined Nicole Watkins and agree with the above assessment  and plan.  Delight Ovens MD Beeper [phone] Office [phone] 10/02/2015 1:45 PM

## 2015-10-02 NOTE — Progress Notes (Signed)
    SUBJECTIVE: The patient is doing well today.  SOB is better, mild pain at chest tube site.   CURRENT MEDICATIONS: . ALPRAZolam  0.125 mg Oral BID  . antiseptic oral rinse  7 mL Mouth Rinse BID  . cholecalciferol  4,000 Units Oral QPM  . docusate sodium  100 mg Oral BID  . lisinopril  20 mg Oral BID  . metoprolol tartrate  25 mg Oral BID  . pantoprazole  40 mg Oral Daily  . sodium chloride flush  3 mL Intravenous Q12H      OBJECTIVE: Physical Exam: Vitals:   10/01/15 1526 10/01/15 2046 10/01/15 2140 10/02/15 0448  BP: 116/77 103/68 94/62 (!) 96/57  Pulse: (!) 118 (!) 143 (!) 128 80  Resp: 18 18  18   Temp:  98.4 F (36.9 C)  98.1 F (36.7 C)  TempSrc:  Oral  Oral  SpO2: 97% 97%  95%  Weight:      Height:        Intake/Output Summary (Last 24 hours) at 10/02/15 1018 Last data filed at 10/02/15 0900  Gross per 24 hour  Intake              315 ml  Output             1024 ml  Net             -709 ml    Telemetry reveals sinus rhythm with safety pacing  GEN- The patient is elderly and thin appearing, alert and oriented x 3 today.   Head- normocephalic, atraumatic Eyes-  Sclera clear, conjunctiva pink Ears- hearing intact Oropharynx- clear Neck- supple  Lungs- normal work of breathing, chest tube in place Heart- Regular rate and rhythm  GI- soft, NT, ND, + BS Extremities- no clubbing, cyanosis, or edema Skin- no rash or lesion Psych- euthymic mood, full affect Neuro- strength and sensation are intact  LABS: Basic Metabolic Panel:  Recent Labs  28/76/81 0309 10/02/15 0315  NA 134* 130*  K 4.3 4.3  CL 104 97*  CO2 23 26  GLUCOSE 154* 116*  BUN 20 21*  CREATININE 1.10* 0.90  CALCIUM 9.1 9.1   CBC:  Recent Labs  10/02/15 0315  WBC 8.6  HGB 13.0  HCT 40.1  MCV 89.1  PLT 214    ASSESSMENT AND PLAN:  1.  Transient complete heart block with syncope S/p PPM Some atrial undersensing resulting in safety pacing this morning. P waves now  0.9-1.38mV.  CXR shows lead in stable position. Sensitivity adjusted. Will follow.   2.  Pneumothorax Appreciate Dr Dennie Maizes assistance Hopefully CT out today and maybe DC home tomorrow   3.  HOCM Stable by echo this admission  4.  CAD No recent ischemic symptoms   Gypsy Balsam, NP 10/02/2015 10:19 AM   Jarold Song

## 2015-10-02 NOTE — Progress Notes (Signed)
10/02/2015 1530 Chest tube removed per order and protocol.  Pt tolerated well. Kathryne Hitch

## 2015-10-03 ENCOUNTER — Encounter (HOSPITAL_COMMUNITY): Payer: Self-pay | Admitting: Physician Assistant

## 2015-10-03 ENCOUNTER — Inpatient Hospital Stay (HOSPITAL_COMMUNITY): Payer: Medicare Other

## 2015-10-03 DIAGNOSIS — Z950 Presence of cardiac pacemaker: Secondary | ICD-10-CM | POA: Diagnosis present

## 2015-10-03 MED ORDER — PANTOPRAZOLE SODIUM 40 MG PO TBEC: 40.0000 mg | DELAYED_RELEASE_TABLET | Freq: Every day | ORAL | 3 refills | Status: DC

## 2015-10-03 NOTE — Progress Notes (Signed)
Order received to discharge patient.  Patient expresses readiness to discharge.  Telemetry monitor removed and CCMD notified.  Discharge instructions, follow up, medications and instructions for their use was discussed with patient and patient voiced understanding.

## 2015-10-03 NOTE — Discharge Summary (Signed)
Discharge Summary    Patient ID: Nicole Watkins,  MRN: [id number], DOB/AGE: 1940-09-08 75 y.o.  Admit date: 09/28/2015 Discharge date: 10/03/2015  Primary Care Provider: Donato Schultz Primary Cardiologist: Dr. Truett Mainland   Discharge Diagnoses    Principal Problem:   CHB (complete heart block) [facility]) Active Problems:   Status post placement of cardiac pacemaker   CAD S/P CFX PCI Jan 2015   LBBB (left bundle branch block)   HTN (hypertension)   Generalized anxiety disorder   Hypertrophic obstructive cardiomyopathy (HCC)   GERD (gastroesophageal reflux disease)   PVC's (premature ventricular contractions)   Allergies Allergies  Allergen Reactions  . Lipitor [Atorvastatin] Other (See Comments)    HA, depression, vomiting, headache  . Penicillins Anaphylaxis    Has patient had a PCN reaction causing immediate rash, facial/tongue/throat swelling, SOB or lightheadedness with hypotension: Yes Has patient had a PCN reaction causing severe rash involving mucus membranes or skin necrosis: No Has patient had a PCN reaction that required hospitalization No Has patient had a PCN reaction occurring within the last 10 years: No If all of the above answers are "NO", then may proceed with Cephalosporin use.   . Ambien [Zolpidem Tartrate] Other (See Comments)    Per patient's daughter-patient becomes delirious  . Crestor [Rosuvastatin Calcium] Nausea And Vomiting    Depression, heading     History of Present Illness     Nicole Watkins is a 75 y.o. female with a history of hypertension, HOCM, hyperlipidemia, coronary artery disease a/p DES to circumflex in 2015, and syncope with loop recorder placement (05/2015) who was admitted on 09/28/15 after complete heart block and possible torsades was detected on her loop recorder.  She has a history of syncope in April 2017. She was seen by electrophysiology and there was some concern for intermittent complete heart block given  her baseline left bundle branch block with her history of MI. An implantable loop recorder was inserted on 06/17/2015.Our office contacted her on 09/28/15 due to detection of complete heart block and possible torsades on her loop recorder. Patient was advised to call 911 per Dr. Graciela Husbands and proceed to [facility] ED for further assessment and admission.   Hospital Course     Consultants: EP   1.  Syncope with documented complete heart block by ILR The patient has had recurrent dizziness that is typical of the symptoms she has prior to frank syncope with documented CHB on ILR. She also had underlying conduction system disease with LBBB and 1st degree AV block. While she is on Metoprolol, this is felt to be required long term for HOCM and CAD per Dr [name]. For this reason, it was felt PPM was indicated.  Upon further review of the loop recorder data, the torsades was felt to actually be escape PVCs. Per discussion between Dr. Johney Frame and Dr Graciela Husbands, she was not felt to warrant ICD given advanced age and clearly documented AV block as the cause for her syncope. She underwent PPM on 09/29/15 with a [employer] Advisa MRI model A2DR01 (serial number [id number]) pacemaker which was complicated by PTX. Per Dr. Gershon Crane note this AM: "some atrial undersensing resulting in safety pacing this morning. P waves now 0.9-1.22mV.  CXR shows lead in stable position. Sensitivity adjusted."  2.  Pneumothorax: she was seen by CTCS and this did require chest tube placement, which is now removed. Chest x-ray today was stable. Follow-up with CT CS has been arranged.  3.  HOCM:  stable by ECHO this admission.  4.  CAD: no recent ischemic symptoms. Continue ASA and BB.  Intolerant of statins per allergies  5.  Anxiety Continue xanax as scheduled   The patient has had an uncomplicated hospital course and is recovering well. The femoral catheter site is stable. She has been seen by Dr. Elberta Fortis today and deemed ready for discharge  home. A staff message has been sent to arrange follow-up appointments. Discharge medications are listed below.  _____________  Discharge Vitals Blood pressure (!) 146/92, pulse 75, temperature 97.9 F (36.6 C), temperature source Oral, resp. rate 18, height 5\' 4"  (1.626 m), weight 125 lb 14.1 oz (57.1 kg), SpO2 94 %.  Filed Weights   09/28/15 1838 09/28/15 2045  Weight: 130 lb (59 kg) 125 lb 14.1 oz (57.1 kg)    Labs & Radiologic Studies     CBC  Recent Labs  10/02/15 0315  WBC 8.6  HGB 13.0  HCT 40.1  MCV 89.1  PLT 214   Basic Metabolic Panel  Recent Labs  10/02/15 0315  NA 130*  K 4.3  CL 97*  CO2 26  GLUCOSE 116*  BUN 21*  CREATININE 0.90  CALCIUM 9.1    Dg Chest 2 View  Result Date: 10/03/2015 CLINICAL DATA:  Encounter for chest tube removal; new pacemaker; SOB EXAM: CHEST  2 VIEW COMPARISON:  10/02/2015 FINDINGS: Interval removal of LEFT chest tube without pneumothorax. LEFT-sided pacemaker overlies stable cardiac silhouette. Lungs are hyperinflated. IMPRESSION: LEFT chest tube removed without appreciable pneumothorax. Hyperinflated lungs. Electronically Signed   By: Genevive Bi M.D.   On: 10/03/2015 07:31   Dg Chest 2 View  Result Date: 09/30/2015 CLINICAL DATA:  Soreness following pacemaker placement. Also shortness of breath. EXAM: CHEST  2 VIEW COMPARISON:  PA and lateral chest x-ray of June 16, 2015 FINDINGS: The patient has sustained at approximately 50-60% left-sided pneumothorax since pacemaker placement. There is mild shift of the mediastinum toward the right. The right lung is hyperinflated and exhibits mild interstitial prominence in the upper lobe which is stable. The heart and pulmonary vascularity are normal. There is calcification in the wall of the aortic arch. The permanent pacemaker electrodes are in reasonable position. The bony thorax is unremarkable. IMPRESSION: 1. New 50% left-sided pneumothorax. Mild mediastinal shift toward the right.  Critical Value/emergent results were called by telephone at the time of interpretation on 09/30/2015 at 7:59 am to Edd Fabian, RN,, who verbally acknowledged these results. 2. Underlying COPD.  No CHF.  Aortic atherosclerosis. Electronically Signed   By: David  [place] M.D.   On: 09/30/2015 08:03   Dg Chest Port 1 View  Result Date: 10/02/2015 CLINICAL DATA:  Sore chest today.  Chest tube replacement. EXAM: PORTABLE CHEST 1 VIEW COMPARISON:  One-view chest 10/01/2015 FINDINGS: The heart size is normal. New lead pacemaker is in place. Emphysema is noted. A left-sided chest tube is in place. There is no residual pneumothorax. This is a somewhat lordotic view, exaggerating up lobe interstitial markings. Atherosclerotic changes are again noted at the thoracic aorta. IMPRESSION: 1. Stable chest tube without pneumothorax. 2. Emphysema. 3. Atherosclerosis of the thoracic aorta. Electronically Signed   By: Marin Roberts M.D.   On: 10/02/2015 07:45   Dg Chest Port 1 View  Result Date: 10/01/2015 CLINICAL DATA:  Chest tube placement. EXAM: PORTABLE CHEST 1 VIEW COMPARISON:  Radiograph of September 30, 2015. FINDINGS: The heart size and mediastinal contours are within normal limits. Atherosclerosis of thoracic aorta is  noted. Left-sided chest tube is unchanged in position without evidence of pneumothorax. Left-sided pacemaker is unchanged in position. No acute pulmonary disease is noted. No significant pleural effusion is noted. The visualized skeletal structures are unremarkable. IMPRESSION: Aortic atherosclerosis. Left-sided chest tube is unchanged in position without evidence of pneumothorax. Electronically Signed   By: Lupita Raider, M.D.   On: 10/01/2015 08:09   Dg Chest Port 1 View  Result Date: 09/30/2015 CLINICAL DATA:  Status post chest tube placement EXAM: PORTABLE CHEST 1 VIEW COMPARISON:  09/30/2015 FINDINGS: New left-sided chest tube has been placed. The previously seen pneumothorax is no longer  present Right lung remains clear. Pacemaker is again seen. No bony abnormality is seen. IMPRESSION: Resolution of pneumothorax on the left following chest tube placement. Electronically Signed   By: Alcide Clever M.D.   On: 09/30/2015 12:05   Dg Chest Port 1 View  Result Date: 09/30/2015 CLINICAL DATA:  Continued surveillance of a pneumothorax. Chest soreness after pacemaker placement. EXAM: PORTABLE CHEST 1 VIEW COMPARISON:  Portable film earlier in the day. FINDINGS: Large LEFT pneumothorax persists, and least 50-60%. Minimal LEFT-to-RIGHT shift but not increased. Hyperinflated RIGHT lung without infiltrates. Normal heart and pulmonary vascularity. Atrial and ventricular leads of the pacemaker are stable. Bony thorax unremarkable. No significant effusion or hemothorax. IMPRESSION: Stable 50%- 60% LEFT pneumothorax. Minimal LEFT-to-RIGHT shift has not progressed from priors. Electronically Signed   By: Elsie Stain M.D.   On: 09/30/2015 11:51     Diagnostic Studies/Procedures    09/29/15 Conclusion  SURGEON:  [name], MD  PREPROCEDURE DIAGNOSIS:  Symptomatic transient complete heart block, syncope POSTPROCEDURE DIAGNOSIS: Symptomatic transient complete heart block, syncope  PROCEDURES:   1. Left upper extremity venography.   2. Pacemaker implantation.   3. Implantable loop recorder removal    _____________  2D ECHO: 09/29/2015 LV EF: 60% -   65 Study Conclusions - Left ventricle: The cavity size was normal. There was severe   focal basal hypertrophy of the septum - measuring 2.0 cm.   Valsalva was not performed. There is an increased LVOT veloctity   suggestive of HOCM. Systolic function was normal. The estimated   ejection fraction was in the range of 60% to 65%. Wall motion was   normal; there were no regional wall motion abnormalities. Doppler   parameters are consistent with abnormal left ventricular   relaxation (grade 1 diastolic dysfunction). The E/e&' ratio is   >20,  suggesting markedly elevated LV filling pressure. - Aortic valve: Mildly calcified leaflets. Very mild stenosis.   There was mild regurgitation. Mean gradient (S): 15 mm Hg. Peak   gradient (S): 29 mm Hg. Valve area (VTI): 2.29 cm^2. - Mitral valve: SAM. Heavy posterior MAC. Mild regurgitation. - Left atrium: Moderately dilated. - Right ventricle: The cavity size was mildly dilated. Systolic   function was low normal. - Right atrium: The atrium was normal in size. - Tricuspid valve: There was mild regurgitation. - Pulmonary arteries: PA peak pressure: 29 mm Hg (S). - Inferior vena cava: The vessel was normal in size. The   respirophasic diameter changes were in the normal range (>= 50%),   consistent with normal central venous pressure. Impressions: - LVEF 60-65%, severe focal basal septal hypertrophy suggestive of   HOCM, increased LVOT gradient of 20 mmHg at rest (valsalva not   performed) - there may be mild aortic valve stenosis as well with   mild regurgitation - AVA around 2.29 cm2, mean AOV gradient of  15   mmHg. Mitral valve SAM with heavy posterior MAC and at least mild   MR, modeate LAE, mild TR, RVSP 29 mmHg, normal IVC.  Disposition   Pt is being discharged home today in good condition.  Follow-up Plans & Appointments    Follow-up Information    Delight Ovens, MD Follow up on 10/13/2015.   Specialty:  Cardiothoracic Surgery Why:  Appointment is at 4:00, please get CXR at [facility] imaging located on 1st floor of our office building Contact information: [address] [place] [address] [phone]        Sherryl Manges, MD .   Specialty:  Cardiology Why:  The office Dorman Calderwood call you to make an appointment for wound check in 10 days and follow-up with Dr. Graciela Husbands in 3 months. Contact information: [address] [place] [address] [phone]            Discharge Medications     Medication List    TAKE these  medications   ALPRAZolam 0.25 MG tablet Commonly known as:  XANAX Take 1 tablet (0.25 mg total) by mouth at bedtime as needed for anxiety.   aspirin 81 MG tablet Take 81 mg by mouth every evening. Reported on 08/10/2015   Cholecalciferol 4000 units Caps Take 4,000 Units by mouth every evening. Reported on 08/10/2015   cyanocobalamin 2000 MCG tablet Take 500 mcg by mouth every evening. Reported on 08/10/2015   docusate sodium 100 MG capsule Commonly known as:  COLACE Take 100 mg by mouth 2 (two) times daily. Reported on 08/10/2015   lisinopril 20 MG tablet Commonly known as:  PRINIVIL,ZESTRIL Take 1 tablet (20 mg total) by mouth 2 (two) times daily.   metoprolol tartrate 25 MG tablet Commonly known as:  LOPRESSOR Take 1 tablet (25 mg total) by mouth 2 (two) times daily.   nitroGLYCERIN 0.4 MG SL tablet Commonly known as:  NITROSTAT Place 0.4 mg under the tongue every 5 (five) minutes as needed for chest pain. Reported on 08/10/2015          Outstanding Labs/Studies   Wound check in 10 days  Duration of Discharge Encounter   Greater than 30 minutes including physician time.  Signed, Cline Crock PA-C 10/03/2015, 9:53 AM  I have seen and examined this patient with Cline Crock.  Agree with above, note added to reflect my findings.  On exam, regular rhythm, no murmurs, lungs clear.  Had pacemaker placed for syncope and heart block found on LINQ monitor complicated by pneumothorax.  Chest tube placed with resolution.  Zarayah Lanting plan for discharge with follow up in device clinic in 10 days for wound check.    Kelin Nixon M. Shelitha Magley MD 10/05/2015 7:52 AM

## 2015-10-03 NOTE — Care Management Note (Signed)
Case Management Note  Patient Details  Name: Rheva Ventrice MRN: [id number] Date of Birth: [birth year]  Subjective/Objective:                  transient complete heart block, now s/p pacemaker implant.  Pneumothorax with chest tube, removed 10/02/15.   PMH:  anxiety, HTN, hypertrophic cardiomyopathy, HLD, CAD, MI, syncope, arthritis   Action/Plan: Cm spoke to patient regarding her discharge planning needs. Patient refused SNF therefore will set up with [facility] per her request. Patient offered choice and patient chose [facility] for PT/Aide/and RN. Patient said that she was going home alone but would have her daughter there with her tonight and then to check on her intermittently after that. CM arranged RW and 3N1 to be taken to bedside and contacted Jermaine with [facility] to get equipment. CM called Tiffany with [facility] who accepted referral for [facility] services (PT, Aide, RN). Patient denies further discharge planning needs and her daughter will be driving her home from the hospital. CM notified rn that orders still needed for face to face and [facility] services.   Expected Discharge Date:  10/03/15              Expected Discharge Plan:  Home w Home Health Services  In-House Referral:     Discharge planning Services  CM Consult  Post Acute Care Choice:  Durable Medical Equipment, Home Health Choice offered to:  Patient  DME Arranged:  3-N-1, Walker rolling DME Agency:  [facility]  HH Arranged:  RN, PT, Nurse's Aide HH Agency:  [facility]  Status of Service:  Completed, signed off  If discussed at Long Length of Stay Meetings, dates discussed:    Additional Comments:  Darcel Smalling, RN 10/03/2015, 4:39 PM

## 2015-10-03 NOTE — Progress Notes (Signed)
    SUBJECTIVE: The patient is doing well today.  SOB is better, chest tube removed yesterday with stable CXR today.   CURRENT MEDICATIONS: . ALPRAZolam  0.125 mg Oral BID  . antiseptic oral rinse  7 mL Mouth Rinse BID  . cholecalciferol  4,000 Units Oral QPM  . docusate sodium  100 mg Oral BID  . lisinopril  20 mg Oral BID  . metoprolol tartrate  25 mg Oral BID  . pantoprazole  40 mg Oral Daily  . sodium chloride flush  3 mL Intravenous Q12H      OBJECTIVE: Physical Exam: Vitals:   10/02/15 1743 10/02/15 1958 10/02/15 2327 10/03/15 0340  BP:  (!) 148/83 110/68 (!) 146/92  Pulse: 83 73  75  Resp:  18  18  Temp:  97.7 F (36.5 C)  97.9 F (36.6 C)  TempSrc:  Oral  Oral  SpO2: 98% 98%  94%  Weight:      Height:        Intake/Output Summary (Last 24 hours) at 10/03/15 0810 Last data filed at 10/02/15 1800  Gross per 24 hour  Intake              483 ml  Output              350 ml  Net              133 ml    Telemetry reveals sinus rhythm with safety pacing  GEN- The patient is elderly and thin appearing, alert and oriented x 3 today.   Head- normocephalic, atraumatic Eyes-  Sclera clear, conjunctiva pink Ears- hearing intact Oropharynx- clear Neck- supple  Lungs- normal work of breathing, chest tube in place Heart- Regular rate and rhythm, 2/6 systolic murmur at the base GI- soft, NT, ND, + BS Extremities- no clubbing, cyanosis, or edema Skin- no rash or lesion Psych- euthymic mood, full affect Neuro- strength and sensation are intact  LABS: Basic Metabolic Panel:  Recent Labs  20/35/59 0315  NA 130*  K 4.3  CL 97*  CO2 26  GLUCOSE 116*  BUN 21*  CREATININE 0.90  CALCIUM 9.1   CBC:  Recent Labs  10/02/15 0315  WBC 8.6  HGB 13.0  HCT 40.1  MCV 89.1  PLT 214    ASSESSMENT AND PLAN:  1.  Transient complete heart block with syncope S/p PPM Some atrial undersensing resulting in safety pacing this morning. P waves now 0.9-1.15mV.  CXR shows  lead in stable position. Sensitivity adjusted. Melisa Donofrio follow.   2.  Pneumothorax Appreciate Dr Dennie Maizes assistance Plan for discharge home today as CXR is stable.  3.  HOCM Stable by echo this admission  4.  CAD No recent ischemic symptoms   Deniss Wormley Elberta Fortis, MD 10/03/2015 8:10 AM

## 2015-10-03 NOTE — Progress Notes (Addendum)
      [address]       [place] [address]             [phone]       4 Days Post-Op Procedure(s) (LRB): Pacemaker Implant (N/A)  Subjective: Patient hoping to go home.  Objective: Vital signs in last 24 hours: Temp:  [97.7 F (36.5 C)-97.9 F (36.6 C)] 97.9 F (36.6 C) (08/05 0340) Pulse Rate:  [73-85] 75 (08/05 0340) Cardiac Rhythm: Heart block (08/04 1900) Resp:  [18] 18 (08/05 0340) BP: (99-148)/(61-92) 146/92 (08/05 0340) SpO2:  [94 %-98 %] 94 % (08/05 0340)     Intake/Output from previous day: 08/04 0701 - 08/05 0700 In: 723 [P.O.:720; I.V.:3] Out: 350 [Urine:350]   Physical Exam:  Cardiovascular: RRR Pulmonary: Clear to auscultation bilaterally Wounds: Dressings are clean and dry.     Lab Results: CBC: Recent Labs  10/02/15 0315  WBC 8.6  HGB 13.0  HCT 40.1  PLT 214   BMET:  Recent Labs  10/02/15 0315  NA 130*  K 4.3  CL 97*  CO2 26  GLUCOSE 116*  BUN 21*  CREATININE 0.90  CALCIUM 9.1    PT/INR: No results for input(s): LABPROT, INR in the last 72 hours. ABG:  INR: Will add last result for INR, ABG once components are confirmed Will add last 4 CBG results once components are confirmed  Assessment/Plan:  1. CV - SR in the 80's this am. 2.  Pulmonary - Chest tube removed yesterday. On room air. CXR this am shows no pneumothorax and hyper inflation of lungs. 3. Ok to discharge when ok with cardiology. Follow up appointment has been arranged.   ZIMMERMAN,DONIELLE MPA-C 10/03/2015,7:47 AM  I have seen and examined the patient and agree with the assessment and plan as outlined.  Purcell Nails, MD 10/03/2015 11:12 AM

## 2015-10-03 NOTE — Discharge Instructions (Signed)
Pneumothorax °A pneumothorax, commonly called a collapsed lung, is a condition in which air leaks from a lung and builds up in the space between the lung and the chest wall (pleural space). The air in a pneumothorax is trapped outside the lung and takes up space, preventing the lung from fully expanding. This is a condition that usually occurs suddenly. The buildup of air may be small or large. A small pneumothorax may go away on its own. When a pneumothorax is larger, it will often require medical treatment and hospitalization.  °CAUSES  °A pneumothorax can sometimes happen quickly with no apparent cause. People with underlying lung problems, particularly COPD or emphysema, are at higher risk of pneumothorax. However, pneumothorax can happen quickly even in people with no prior known lung problems. Trauma, surgery, medical procedures, or injury to the chest wall can also cause a pneumothorax. °SIGNS AND SYMPTOMS  °Sometimes a pneumothorax will have no symptoms. When symptoms are present, they can include: °· Chest pain. °· Shortness of breath. °· Increased rate of breathing. °· Bluish color to your lips or skin (cyanosis). °DIAGNOSIS  °Pneumothorax is usually diagnosed by a chest X-ray or chest CT scan. Your health care provider will also take a medical history and perform a physical exam to determine why you may have a pneumothorax. °TREATMENT  °A small pneumothorax may go away on its own without treatment. Extra oxygen can sometimes help a small pneumothorax go away more quickly. For a larger pneumothorax or a pneumothorax that is causing symptoms, a procedure is usually needed to drain the air. In some cases, the health care provider may drain the air using a needle. In other cases, a chest tube may be inserted into the pleural space. A chest tube is a small tube placed between the ribs and into the pleural space. This removes the extra air and allows the lung to expand back to its normal size. A large  pneumothorax will usually require a hospital stay. If there is ongoing air leakage into the pleural space, then the chest tube may need to remain in place for several days until the air leak has healed. In some cases, surgery may be needed.  °HOME CARE INSTRUCTIONS  °· Only take over-the-counter or prescription medicines as directed by your health care provider. °· If a cough or pain makes it difficult for you to sleep at night, try sleeping in a semi-upright position in a recliner or by using 2 or 3 pillows. °· Rest and limit activity as directed by your health care provider. °· If you had a chest tube and it was removed, ask your health care provider when it is okay to remove the dressing. Until your health care provider says you can remove the dressing, do not allow it to get wet. °· Do not smoke. Smoking is a risk factor for pneumothorax. °· Do not fly in an airplane or scuba dive until your health care provider says it is okay. °· Follow up with your health care provider as directed. °SEEK IMMEDIATE MEDICAL CARE IF:  °· You have increasing chest pain or shortness of breath. °· You have a cough that is not controlled with suppressants. °· You begin coughing up blood. °· You have pain that is getting worse or is not controlled with medicines. °· You cough up thick, discolored mucus (sputum) that is yellow to green in color. °· You have redness, increasing pain, or discharge at the site where a chest tube had been in place (if   your pneumothorax was treated with a chest tube).  The site where your chest tube was located opens up.  You feel air coming out of the site where the chest tube was placed.  You have a fever or persistent symptoms for more than 2-3 days.  You have a fever and your symptoms suddenly get worse. MAKE SURE YOU:   Understand these instructions.  Will watch your condition.  Will get help right away if you are not doing well or get worse.   This information is not intended to  replace advice given to you by your health care provider. Make sure you discuss any questions you have with your health care provider.   Document Released: 02/14/2005 Document Revised: 12/05/2012 Document Reviewed: 09/13/2012 Elsevier Interactive Patient Education 2016 [employer].      Supplemental Discharge Instructions for  Pacemaker/Defibrillator Patients  Activity No heavy lifting or vigorous activity with your left/right arm for 6 to 8 weeks.  Do not raise your left/right arm above your head for one week.  Gradually raise your affected arm as drawn below.            10/01/15                       10/02/15                      10/03/15                     10/04/15 NO DRIVING for  1 week  ; you may begin driving on 09/28/57.  WOUND CARE - Keep the wound area clean and dry.  Do not get this area wet for one week. No showers for one week; you may shower on 10/06/15  . - The tape/steri-strips on your wound will fall off; do not pull them off.  No bandage is needed on the site.  DO  NOT apply any creams, oils, or ointments to the wound area. - If you notice any drainage or discharge from the wound, any swelling or bruising at the site, or you develop a fever > 101? F after you are discharged home, call the office at once.  Special Instructions - You are still able to use cellular telephones; use the ear opposite the side where you have your pacemaker/defibrillator.  Avoid carrying your cellular phone near your device. - When traveling through airports, show security personnel your identification card to avoid being screened in the metal detectors.  Ask the security personnel to use the hand wand. - Avoid arc welding equipment, MRI testing (magnetic resonance imaging), TENS units (transcutaneous nerve stimulators).  Call the office for questions about other devices. - Avoid electrical appliances that are in poor condition or are not properly grounded. - Microwave ovens are safe to be near or to  operate.  Additional information for defibrillator patients should your device go off: - If your device goes off ONCE and you feel fine afterward, notify the device clinic nurses. - If your device goes off ONCE and you do not feel well afterward, call 911. - If your device goes off TWICE, call 911. - If your device goes off THREE times in one day, call 911.  DO NOT DRIVE YOURSELF OR A FAMILY MEMBER WITH A DEFIBRILLATOR TO THE HOSPITAL--CALL 911.

## 2015-10-03 NOTE — Evaluation (Addendum)
Physical Therapy Evaluation Patient Details Name: Nicole Watkins MRN: [id number] DOB: [birth year] Today's Date: 10/03/2015   History of Present Illness  Patient is a 75 yo female admitted 09/28/15 with transient complete heart block, now s/p pacemaker implant.  Pneumothorax with chest tube, removed 10/02/15.   PMH:  anxiety, HTN, hypertrophic cardiomyopathy, HLD, CAD, MI, syncope, arthritis    Clinical Impression  Patient presents with problems listed below.  Will benefit from acute PT to maximize functional independence prior to d/c home.  Patient with general weakness and decreased balance, impacting mobility and safety.  Also, LUE movement is restricted, impacting ability to complete ADL's.  Recommend HHPT and HH Aide at d/c.  Also recommend patient use RW at all times for gait.  Contacted Nicole Watkins, CM for d/c recommendations.    Follow Up Recommendations Home health PT;Supervision - Intermittent (Patient declined SNF)    Equipment Recommendations  Rolling walker with 5" wheels;3in1 (PT)    Recommendations for Other Services       Precautions / Restrictions Precautions Precautions: Fall;ICD/Pacemaker Precaution Comments: Reviewed precautions with pacemaker (UE movement limitations) Restrictions Weight Bearing Restrictions: No Other Position/Activity Restrictions: Minimize use of LUE      Mobility  Bed Mobility Overal bed mobility: Modified Independent             General bed mobility comments: Verbal cues for technique to protect LUE.  Patient required increased time.  Transfers Overall transfer level: Needs assistance Equipment used: None;Rolling walker (2 wheeled) Transfers: Sit to/from Stand Sit to Stand: Min guard;Modified independent (Device/Increase time)         General transfer comment: Patient moved to standing with no assistive device, requiring assist for safety/balance.  Difficulty rising from low surface.  Improved balance upon stance with use of RW on second  attempt.  Ambulation/Gait Ambulation/Gait assistance: Min assist;Modified independent (Device/Increase time) Ambulation Distance (Feet): 120 Feet (90' no AD, 65' with RW) Assistive device: None;Rolling walker (2 wheeled) Gait Pattern/deviations: Step-through pattern;Decreased stride length;Trunk flexed;Staggering left;Staggering right Gait velocity: decreased Gait velocity interpretation: Below normal speed for age/gender General Gait Details: Patient initially ambulated with no assistive device.  Gait unsteady, staggering to both sides.  Required min assist for balance/safety.  Instructed patient on use of RW.  Balance improved with RW, requiring no assist.  Stairs            Wheelchair Mobility    Modified Rankin (Stroke Patients Only)       Balance Overall balance assessment: Needs assistance         Standing balance support: No upper extremity supported Standing balance-Leahy Scale: Fair Standing balance comment: Patient requires UE support for balance with dynamic activities - recommend use of RW                             Pertinent Vitals/Pain Pain Assessment: 0-10 Pain Score: 3  Pain Location: Lt shoulder Pain Descriptors / Indicators: Aching;Sore;Throbbing Pain Intervention(s): Limited activity within patient's tolerance;Monitored during session;Repositioned    Home Living Family/patient expects to be discharged to:: Private residence Living Arrangements: Alone Available Help at Discharge: Family;Neighbor;Available PRN/intermittently (Grandson in area; daughter in [place]) Type of Home: House (Townhouse) Home Access: Stairs to enter Entrance Stairs-Rails: None Technical brewer of Steps: 3 Home Layout: Two level;Bed/bath upstairs (Planning to stay on main floor on couch for several days) Home Equipment: None      Prior Function Level of Independence: Independent  Hand Dominance   Dominant Hand: Right     Extremity/Trunk Assessment   Upper Extremity Assessment: LUE deficits/detail       LUE Deficits / Details: Shoulder ROM limited to 90* due to pacemaker implant   Lower Extremity Assessment: Generalized weakness      Cervical / Trunk Assessment: Kyphotic  Communication   Communication: No difficulties  Cognition Arousal/Alertness: Awake/alert Behavior During Therapy: WFL for tasks assessed/performed;Anxious Overall Cognitive Status: Within Functional Limits for tasks assessed                      General Comments      Exercises        Assessment/Plan    PT Assessment All further PT needs can be met in the next venue of care;Patient needs continued PT services  PT Diagnosis Difficulty walking;Generalized weakness;Acute pain   PT Problem List Decreased strength;Decreased range of motion;Decreased balance;Decreased mobility;Decreased knowledge of use of DME;Decreased knowledge of precautions;Pain  PT Treatment Interventions DME instruction;Gait training;Functional mobility training;Therapeutic activities;Balance training;Patient/family education   PT Goals (Current goals can be found in the Care Plan section) Acute Rehab PT Goals Patient Stated Goal: To go home PT Goal Formulation: With patient/family Time For Goal Achievement: 10/10/15 Potential to Achieve Goals: Good    Frequency Min 3X/week   Barriers to discharge Decreased caregiver support Patient lives alone - declines SNF    Co-evaluation               End of Session Equipment Utilized During Treatment: Gait belt Activity Tolerance: Patient tolerated treatment well Patient left: in bed;with call bell/phone within reach;with family/visitor present Nurse Communication: Mobility status (d/c needs - RW, 3-in-1 BSC, HHPT, HH Aide)         Time: 9179-1505 PT Time Calculation (min) (ACUTE ONLY): 28 min   Charges:   PT Evaluation $PT Eval Moderate Complexity: 1 Procedure PT Treatments $Gait  Training: 8-22 mins   PT G CodesDespina Pole 10/27/15, 4:11 PM Carita Pian. Sanjuana Kava, [place] Pager [phone]

## 2015-10-05 ENCOUNTER — Telehealth: Payer: Self-pay | Admitting: Cardiology

## 2015-10-05 ENCOUNTER — Telehealth: Payer: Self-pay | Admitting: Family Medicine

## 2015-10-05 DIAGNOSIS — K219 Gastro-esophageal reflux disease without esophagitis: Secondary | ICD-10-CM | POA: Diagnosis not present

## 2015-10-05 DIAGNOSIS — Z48812 Encounter for surgical aftercare following surgery on the circulatory system: Secondary | ICD-10-CM | POA: Diagnosis not present

## 2015-10-05 DIAGNOSIS — I10 Essential (primary) hypertension: Secondary | ICD-10-CM | POA: Diagnosis not present

## 2015-10-05 DIAGNOSIS — F411 Generalized anxiety disorder: Secondary | ICD-10-CM | POA: Diagnosis not present

## 2015-10-05 DIAGNOSIS — I2510 Atherosclerotic heart disease of native coronary artery without angina pectoris: Secondary | ICD-10-CM | POA: Diagnosis not present

## 2015-10-05 DIAGNOSIS — I421 Obstructive hypertrophic cardiomyopathy: Secondary | ICD-10-CM | POA: Diagnosis not present

## 2015-10-05 DIAGNOSIS — Z7982 Long term (current) use of aspirin: Secondary | ICD-10-CM | POA: Diagnosis not present

## 2015-10-05 DIAGNOSIS — Z950 Presence of cardiac pacemaker: Secondary | ICD-10-CM | POA: Diagnosis not present

## 2015-10-05 NOTE — Telephone Encounter (Signed)
Patient has pacemaker implanted and now needs OT.  please advise     KP

## 2015-10-05 NOTE — Telephone Encounter (Signed)
Ok to give verbal 

## 2015-10-05 NOTE — Telephone Encounter (Signed)
New Message  [place] [facility] requesting to speak with RN. Holly want to see it would be okay if pt took ibuprofen. Jeanice LimHolly states pt is taking Tylenol but is not helping. Please call back to discuss

## 2015-10-05 NOTE — Telephone Encounter (Signed)
Caller name: Jeanice LimHolly  Relation to pt: [facility]  Call back number: [phone]    Reason for call:  Requesting verbal orders for OT

## 2015-10-05 NOTE — Telephone Encounter (Signed)
Returned call to [place]. She stated patient is taking Tylenol but still has intermittent sharp pains near the area of the device at times. She is asking if the patient can take ibuprofen to help with the discomfort. Will route to Dr Jens Somrenshaw for advice.

## 2015-10-05 NOTE — Telephone Encounter (Signed)
Ok for motrin 600 mg po TID with food for 3-5 days Olga MillersBrian Myrle Dues

## 2015-10-06 ENCOUNTER — Telehealth: Payer: Self-pay | Admitting: Internal Medicine

## 2015-10-06 NOTE — Telephone Encounter (Signed)
Returned call to Ms. Dewayne ShorterSuzanne Baldi. PPM as well as incision from implant need to be checked 10-14 days after implant. Rescheduled appt to accommodate transportation issues. She will call back if 11/04/15 appt with Dr. Johney FrameAllred needs to be rescheduled- she is unaware of her September schedule at this time.

## 2015-10-06 NOTE — Telephone Encounter (Signed)
Advice [facility] to [facility], who verbalized understanding.

## 2015-10-06 NOTE — Telephone Encounter (Signed)
Verbal [facility] to [facility] to perform OT.    KP

## 2015-10-06 NOTE — Telephone Encounter (Signed)
New message    Daughter has some questions regarding home health nurses assess patients  wound check.   Patient does not drive  -home health comes x 3 day a week.

## 2015-10-07 DIAGNOSIS — I421 Obstructive hypertrophic cardiomyopathy: Secondary | ICD-10-CM | POA: Diagnosis not present

## 2015-10-07 DIAGNOSIS — Z48812 Encounter for surgical aftercare following surgery on the circulatory system: Secondary | ICD-10-CM | POA: Diagnosis not present

## 2015-10-07 DIAGNOSIS — F411 Generalized anxiety disorder: Secondary | ICD-10-CM | POA: Diagnosis not present

## 2015-10-07 DIAGNOSIS — I10 Essential (primary) hypertension: Secondary | ICD-10-CM | POA: Diagnosis not present

## 2015-10-07 DIAGNOSIS — I2510 Atherosclerotic heart disease of native coronary artery without angina pectoris: Secondary | ICD-10-CM | POA: Diagnosis not present

## 2015-10-07 DIAGNOSIS — K219 Gastro-esophageal reflux disease without esophagitis: Secondary | ICD-10-CM | POA: Diagnosis not present

## 2015-10-08 ENCOUNTER — Ambulatory Visit: Payer: Medicare Other

## 2015-10-08 DIAGNOSIS — K219 Gastro-esophageal reflux disease without esophagitis: Secondary | ICD-10-CM | POA: Diagnosis not present

## 2015-10-08 DIAGNOSIS — I421 Obstructive hypertrophic cardiomyopathy: Secondary | ICD-10-CM | POA: Diagnosis not present

## 2015-10-08 DIAGNOSIS — Z48812 Encounter for surgical aftercare following surgery on the circulatory system: Secondary | ICD-10-CM | POA: Diagnosis not present

## 2015-10-08 DIAGNOSIS — F411 Generalized anxiety disorder: Secondary | ICD-10-CM | POA: Diagnosis not present

## 2015-10-08 DIAGNOSIS — I10 Essential (primary) hypertension: Secondary | ICD-10-CM | POA: Diagnosis not present

## 2015-10-08 DIAGNOSIS — I2510 Atherosclerotic heart disease of native coronary artery without angina pectoris: Secondary | ICD-10-CM | POA: Diagnosis not present

## 2015-10-08 LAB — CUP PACEART REMOTE DEVICE CHECK: Date Time Interrogation Session: 20170718213856

## 2015-10-09 DIAGNOSIS — I421 Obstructive hypertrophic cardiomyopathy: Secondary | ICD-10-CM | POA: Diagnosis not present

## 2015-10-09 DIAGNOSIS — I10 Essential (primary) hypertension: Secondary | ICD-10-CM | POA: Diagnosis not present

## 2015-10-09 DIAGNOSIS — I2510 Atherosclerotic heart disease of native coronary artery without angina pectoris: Secondary | ICD-10-CM | POA: Diagnosis not present

## 2015-10-09 DIAGNOSIS — Z48812 Encounter for surgical aftercare following surgery on the circulatory system: Secondary | ICD-10-CM | POA: Diagnosis not present

## 2015-10-09 DIAGNOSIS — K219 Gastro-esophageal reflux disease without esophagitis: Secondary | ICD-10-CM | POA: Diagnosis not present

## 2015-10-09 DIAGNOSIS — F411 Generalized anxiety disorder: Secondary | ICD-10-CM | POA: Diagnosis not present

## 2015-10-12 ENCOUNTER — Other Ambulatory Visit: Payer: Self-pay | Admitting: Cardiothoracic Surgery

## 2015-10-12 DIAGNOSIS — I421 Obstructive hypertrophic cardiomyopathy: Secondary | ICD-10-CM | POA: Diagnosis not present

## 2015-10-12 DIAGNOSIS — J939 Pneumothorax, unspecified: Secondary | ICD-10-CM

## 2015-10-12 DIAGNOSIS — Z48812 Encounter for surgical aftercare following surgery on the circulatory system: Secondary | ICD-10-CM | POA: Diagnosis not present

## 2015-10-12 DIAGNOSIS — I2510 Atherosclerotic heart disease of native coronary artery without angina pectoris: Secondary | ICD-10-CM | POA: Diagnosis not present

## 2015-10-12 DIAGNOSIS — I10 Essential (primary) hypertension: Secondary | ICD-10-CM | POA: Diagnosis not present

## 2015-10-12 DIAGNOSIS — K219 Gastro-esophageal reflux disease without esophagitis: Secondary | ICD-10-CM | POA: Diagnosis not present

## 2015-10-12 DIAGNOSIS — F411 Generalized anxiety disorder: Secondary | ICD-10-CM | POA: Diagnosis not present

## 2015-10-13 ENCOUNTER — Ambulatory Visit (INDEPENDENT_AMBULATORY_CARE_PROVIDER_SITE_OTHER): Payer: Medicare Other | Admitting: *Deleted

## 2015-10-13 ENCOUNTER — Telehealth: Payer: Self-pay | Admitting: Family Medicine

## 2015-10-13 ENCOUNTER — Ambulatory Visit
Admission: RE | Admit: 2015-10-13 | Discharge: 2015-10-13 | Disposition: A | Discharge status: Home / Self Care | Payer: Medicare Other | Source: Ambulatory Visit | Attending: Cardiothoracic Surgery | Admitting: Cardiothoracic Surgery

## 2015-10-13 ENCOUNTER — Ambulatory Visit (INDEPENDENT_AMBULATORY_CARE_PROVIDER_SITE_OTHER): Payer: Medicare Other | Admitting: Cardiothoracic Surgery

## 2015-10-13 ENCOUNTER — Ambulatory Visit: Payer: Medicare Other | Admitting: Family Medicine

## 2015-10-13 ENCOUNTER — Encounter: Payer: Self-pay | Admitting: Internal Medicine

## 2015-10-13 ENCOUNTER — Ambulatory Visit (INDEPENDENT_AMBULATORY_CARE_PROVIDER_SITE_OTHER): Payer: Medicare Other | Admitting: Family Medicine

## 2015-10-13 ENCOUNTER — Encounter: Payer: Self-pay | Admitting: Family Medicine

## 2015-10-13 ENCOUNTER — Encounter: Payer: Self-pay | Admitting: Cardiothoracic Surgery

## 2015-10-13 VITALS — BP 156/90 | HR 71 | Resp 16 | Ht 64.0 in | Wt 125.0 lb

## 2015-10-13 VITALS — BP 170/96 | HR 77 | Temp 97.8°F | Wt 129.6 lb

## 2015-10-13 DIAGNOSIS — K219 Gastro-esophageal reflux disease without esophagitis: Secondary | ICD-10-CM | POA: Diagnosis not present

## 2015-10-13 DIAGNOSIS — I2510 Atherosclerotic heart disease of native coronary artery without angina pectoris: Secondary | ICD-10-CM

## 2015-10-13 DIAGNOSIS — N390 Urinary tract infection, site not specified: Secondary | ICD-10-CM

## 2015-10-13 DIAGNOSIS — R350 Frequency of micturition: Secondary | ICD-10-CM

## 2015-10-13 DIAGNOSIS — J939 Pneumothorax, unspecified: Secondary | ICD-10-CM

## 2015-10-13 DIAGNOSIS — R300 Dysuria: Secondary | ICD-10-CM

## 2015-10-13 DIAGNOSIS — F411 Generalized anxiety disorder: Secondary | ICD-10-CM

## 2015-10-13 DIAGNOSIS — Z9889 Other specified postprocedural states: Secondary | ICD-10-CM | POA: Diagnosis not present

## 2015-10-13 DIAGNOSIS — I442 Atrioventricular block, complete: Secondary | ICD-10-CM

## 2015-10-13 DIAGNOSIS — Z9861 Coronary angioplasty status: Secondary | ICD-10-CM

## 2015-10-13 DIAGNOSIS — I10 Essential (primary) hypertension: Secondary | ICD-10-CM | POA: Diagnosis not present

## 2015-10-13 DIAGNOSIS — I421 Obstructive hypertrophic cardiomyopathy: Secondary | ICD-10-CM | POA: Diagnosis not present

## 2015-10-13 DIAGNOSIS — Z48812 Encounter for surgical aftercare following surgery on the circulatory system: Secondary | ICD-10-CM | POA: Diagnosis not present

## 2015-10-13 LAB — POC URINALSYSI DIPSTICK (AUTOMATED)
Bilirubin, UA: NEGATIVE
GLUCOSE UA: NEGATIVE
Ketones, UA: NEGATIVE
NITRITE UA: NEGATIVE
PH UA: 7
PROTEIN UA: NEGATIVE
Spec Grav, UA: 1.01
UROBILINOGEN UA: 2

## 2015-10-13 LAB — CUP PACEART INCLINIC DEVICE CHECK
Battery Remaining Longevity: 131 mo
Brady Statistic AP VP Percent: 0.45 %
Brady Statistic AP VS Percent: 3.95 %
Brady Statistic AS VP Percent: 0.75 %
Brady Statistic AS VS Percent: 94.85 %
Brady Statistic RV Percent Paced: 1.2 %
Date Time Interrogation Session: 20170815170910
Implantable Lead Implant Date: 20170801
Implantable Lead Location: 753859
Lead Channel Impedance Value: 399 Ohm
Lead Channel Impedance Value: 475 Ohm
Lead Channel Impedance Value: 589 Ohm
Lead Channel Pacing Threshold Amplitude: 1 V
Lead Channel Sensing Intrinsic Amplitude: 31.625 mV
Lead Channel Sensing Intrinsic Amplitude: 31.625 mV
Lead Channel Setting Pacing Amplitude: 3.5 V
Lead Channel Setting Pacing Amplitude: 3.5 V
Lead Channel Setting Pacing Pulse Width: 0.4 ms
Lead Channel Setting Sensing Sensitivity: 2.8 mV
MDC IDC LEAD IMPLANT DT: 20170801
MDC IDC LEAD LOCATION: 753860
MDC IDC MSMT BATTERY VOLTAGE: 3.1 V
MDC IDC MSMT LEADCHNL RA IMPEDANCE VALUE: 323 Ohm
MDC IDC MSMT LEADCHNL RA PACING THRESHOLD AMPLITUDE: 0.5 V
MDC IDC MSMT LEADCHNL RA PACING THRESHOLD PULSEWIDTH: 0.4 ms
MDC IDC MSMT LEADCHNL RA SENSING INTR AMPL: 1.125 mV
MDC IDC MSMT LEADCHNL RA SENSING INTR AMPL: 1.125 mV
MDC IDC MSMT LEADCHNL RV PACING THRESHOLD PULSEWIDTH: 0.4 ms
MDC IDC STAT BRADY RA PERCENT PACED: 4.4 %

## 2015-10-13 MED ORDER — PHENAZOPYRIDINE HCL 200 MG PO TABS: 200.0000 mg | ORAL_TABLET | Freq: Three times a day (TID) | ORAL | 0 refills | Status: DC | PRN

## 2015-10-13 MED ORDER — CIPROFLOXACIN HCL 500 MG PO TABS
500.0000 mg | ORAL_TABLET | Freq: Two times a day (BID) | ORAL | 0 refills | Status: DC
Start: 2015-10-13 — End: 2015-10-28

## 2015-10-13 MED ORDER — ALPRAZOLAM 0.25 MG PO TABS: 0.2500 mg | ORAL_TABLET | Freq: Two times a day (BID) | ORAL | 0 refills | Status: DC | PRN

## 2015-10-13 MED ORDER — OXYBUTYNIN CHLORIDE ER 5 MG PO TB24: 5.0000 mg | ORAL_TABLET | Freq: Every day | ORAL | Status: DC

## 2015-10-13 MED ORDER — NITROGLYCERIN 0.4 MG SL SUBL: 0.4000 mg | SUBLINGUAL_TABLET | SUBLINGUAL | 0 refills | Status: DC | PRN

## 2015-10-13 NOTE — Assessment & Plan Note (Signed)
Pt very anxious today con't xanax prn bp better at home

## 2015-10-13 NOTE — Patient Instructions (Signed)

## 2015-10-13 NOTE — Progress Notes (Signed)
[address]       [place] [id number]             [phone]      Nicole RocheJuliette Watkins [place] Medical Record #[id number] Date of Birth: 05/20/1940  Referring: Hillis RangeAllred, James, MD Primary Care: Donato SchultzYvonne R Lowne Chase, DO  Chief Complaint:   POST OP FOLLOW UP Follow-up after chest tube placement for spontaneous left pneumothorax.  History of Present Illness:     Patient was seen urgently in the hospital August 2 after placement of a left subclavian vein pacemaker and resulting left pneumothorax. A chest tube was placed. Both move this was removed without difficulty and the patient was discharged home with resolution of the pneumothorax. Since discharge she is continuing to gain strength with the help of physical therapy. She has developed a urinary tract infection and saw her primary care doctor this morning. She comes to the office this afternoon with a chest x-ray     Past Medical History:  Diagnosis Date  . Anxiety   . Arthritis   . CAD (coronary artery disease)   . CHB (complete heart block) (HCC)    a. s/p PPM on 09/29/15  . GERD (gastroesophageal reflux disease)   . Hyperlipidemia   . Hypertension   . LBBB (left bundle branch block)   . Status post placement of cardiac pacemaker    a. 09/2015: Medtronic Advisa MRI model [id number] (serial number [id number]) pacemaker  . Thyroid-related proptosis      History  Smoking Status  . Former Smoker  Smokeless Tobacco  . Never Used    History  Alcohol Use No     Allergies  Allergen Reactions  . Lipitor [Atorvastatin] Other (See Comments)    HA, depression, vomiting, headache  . Penicillins Anaphylaxis    Has patient had a PCN reaction causing immediate rash, facial/tongue/throat swelling, SOB or lightheadedness with hypotension: Yes Has patient had a PCN reaction causing severe rash involving mucus membranes or skin necrosis: No Has patient had a PCN reaction that required hospitalization No Has patient  had a PCN reaction occurring within the last 10 years: No If all of the above answers are "NO", then may proceed with Cephalosporin use.   . Ambien [Zolpidem Tartrate] Other (See Comments)    Per patient's daughter-patient becomes delirious  . Crestor [Rosuvastatin Calcium] Nausea And Vomiting    Depression, heading    Current Outpatient Prescriptions  Medication Sig Dispense Refill  . ALPRAZolam (XANAX) 0.25 MG tablet Take 1 tablet (0.25 mg total) by mouth 2 (two) times daily as needed for anxiety. 60 tablet 0  . aspirin 81 MG tablet Take 81 mg by mouth every evening. Reported on 08/10/2015    . Cholecalciferol 4000 UNITS CAPS Take 4,000 Units by mouth every evening. Reported on 08/10/2015    . ciprofloxacin (CIPRO) 500 MG tablet Take 1 tablet (500 mg total) by mouth 2 (two) times daily. 20 tablet 0  . cyanocobalamin 2000 MCG tablet Take 500 mcg by mouth every evening. Reported on 08/10/2015    . docusate sodium (COLACE) 100 MG capsule Take 100 mg by mouth 2 (two) times daily. Reported on 08/10/2015    . lisinopril (PRINIVIL,ZESTRIL) 20 MG tablet Take 1 tablet (20 mg total) by mouth 2 (two) times daily. 180 tablet 3  . metoprolol tartrate (LOPRESSOR) 25 MG tablet Take 1 tablet (25 mg total) by mouth 2 (two) times daily. 180 tablet 3  . nitroGLYCERIN (NITROSTAT)  0.4 MG SL tablet Place 1 tablet (0.4 mg total) under the tongue every 5 (five) minutes as needed for chest pain. Reported on 08/10/2015 30 tablet 0  . pantoprazole (PROTONIX) 40 MG tablet Take 1 tablet (40 mg total) by mouth daily. 90 tablet 3  . phenazopyridine (PYRIDIUM) 200 MG tablet Take 1 tablet (200 mg total) by mouth 3 (three) times daily as needed for pain. 10 tablet 0   Current Facility-Administered Medications  Medication Dose Route Frequency Provider Last Rate Last Dose  . oxybutynin (DITROPAN-XL) 24 hr tablet 5 mg  5 mg Oral QHS Donato SchultzYvonne R Lowne Chase, DO           Physical Exam: BP (!) 156/90   Pulse 71   Resp 16   Ht  5\' 4"  (1.626 m)   Wt 125 lb (56.7 kg)   SpO2 97% Comment: ON RA  BMI 21.46 kg/m   General appearance: alert, cooperative, appears stated age and no distress Neurologic: intact Heart: regular rate and rhythm, S1, S2 normal, no murmur, click, rub or gallop Lungs: diminished breath sounds bibasilar Abdomen: soft, non-tender; bowel sounds normal; no masses,  no organomegaly Extremities: extremities normal, atraumatic, no cyanosis or edema and Homans sign is negative, no sign of DVT Wound: Chest tube sites well healed, pacer pocket in the left infraclavicular area has some mild bruising without much swelling in the incision is intact and healing   Diagnostic Studies & Laboratory data:     Recent Radiology Findings:   Dg Chest 2 View  Result Date: 10/13/2015 CLINICAL DATA:  Follow-up pneumothorax. Pacemaker placed 2 weeks ago. EXAM: CHEST  2 VIEW COMPARISON:  10/03/2015 FINDINGS: Left pacer remains in place, unchanged. There is hyperinflation of the lungs compatible with COPD. No visible pneumothorax. Chronic changes in the lungs. Heart is normal size. No effusions. No acute bony abnormality. IMPRESSION: COPD/chronic changes.  No acute findings.  No pneumothorax. Electronically Signed   By: Charlett NoseKevin  Dover M.D.   On: 10/13/2015 16:16    I have independently reviewed the above radiology studies  and reviewed the findings with the patient.   Recent Lab Findings: Lab Results  Component Value Date   WBC 8.6 10/02/2015   HGB 13.0 10/02/2015   HCT 40.1 10/02/2015   PLT 214 10/02/2015   GLUCOSE 116 (H) 10/02/2015   CHOL 204 (H) 07/07/2015   TRIG 89.0 07/07/2015   HDL 61.30 07/07/2015   LDLCALC 125 (H) 07/07/2015   ALT 12 07/07/2015   AST 18 07/07/2015   NA 130 (L) 10/02/2015   K 4.3 10/02/2015   CL 97 (L) 10/02/2015   CREATININE 0.90 10/02/2015   BUN 21 (H) 10/02/2015   CO2 26 10/02/2015   TSH 2.843 09/28/2015   INR 1.09 09/29/2015      Assessment / Plan:      Stable after  placement of left chest tube with resolution of pneumothorax, follow-up chest x-rays shows full inflation of lung. The patient does have significant changes of COPD in upper lobe hyperventilation chronically. We'll plan to see her back as needed.      Delight OvensEdward B Donyetta Ogletree MD      [address] [place] [id number] Office [phone]   Beeper [phone]  10/13/2015 5:16 PM

## 2015-10-13 NOTE — Progress Notes (Signed)
Wound check appointment. Steri-strips removed. Wound without redness or edema. Incision edges approximated, wound well healed. Normal device function. Thresholds, sensing, and impedances consistent with implant measurements. Device programmed at 3.5V for extra safety margin until 3 month visit. Histogram distribution appropriate for patient and level of activity. (1) AT/AF episode x 55 sec @ 175/124. No high ventricular rates noted. Patient educated about wound care, arm mobility, lifting restrictions. ROV w/JA on 9/6 @1230 .

## 2015-10-13 NOTE — Progress Notes (Signed)
Patient ID: Nicole BorsJuliette Orengo, female    DOB: 12/30/1940  Age: 75 y.o. MRN: [id number]    Subjective:  Subjective  HPI Nicole Watkins presents for uti symptoms--- dysuria and frequency.  Review of Systems  Constitutional: Negative for appetite change, diaphoresis, fatigue and unexpected weight change.  Eyes: Negative for pain, redness and visual disturbance.  Respiratory: Negative for cough, chest tightness, shortness of breath and wheezing.   Cardiovascular: Negative for chest pain, palpitations and leg swelling.  Endocrine: Negative for cold intolerance, heat intolerance, polydipsia, polyphagia and polyuria.  Genitourinary: Positive for dysuria, frequency, hematuria and urgency. Negative for difficulty urinating.  Neurological: Negative for dizziness, light-headedness, numbness and headaches.  Psychiatric/Behavioral: Negative for agitation. The patient is nervous/anxious.     History Past Medical History:  Diagnosis Date  . Anxiety   . Arthritis   . CAD (coronary artery disease)   . CHB (complete heart block) (HCC)    a. s/p PPM on 09/29/15  . GERD (gastroesophageal reflux disease)   . Hyperlipidemia   . Hypertension   . LBBB (left bundle branch block)   . Status post placement of cardiac pacemaker    a. 09/2015: Medtronic Advisa MRI model [id number] (serial number [id number]) pacemaker  . Thyroid-related proptosis     Nicole Watkins has a past surgical history that includes No family history; Cardiac surgery; Cardiac catheterization (N/A, 06/17/2015); and Cardiac catheterization (N/A, 09/29/2015).   Her family history includes Arthritis in her mother; Hyperlipidemia in her mother; Hypertension in her mother; Hypothyroidism in her mother; Uterine cancer in her maternal grandmother.Nicole Watkins reports that Nicole Watkins has quit smoking. Nicole Watkins has never used smokeless tobacco. Nicole Watkins reports that Nicole Watkins does not drink alcohol. Her drug history is not on file.  Current Outpatient Prescriptions on File Prior to Visit    Medication Sig Dispense Refill  . aspirin 81 MG tablet Take 81 mg by mouth every evening. Reported on 08/10/2015    . Cholecalciferol 4000 UNITS CAPS Take 4,000 Units by mouth every evening. Reported on 08/10/2015    . cyanocobalamin 2000 MCG tablet Take 500 mcg by mouth every evening. Reported on 08/10/2015    . docusate sodium (COLACE) 100 MG capsule Take 100 mg by mouth 2 (two) times daily. Reported on 08/10/2015    . lisinopril (PRINIVIL,ZESTRIL) 20 MG tablet Take 1 tablet (20 mg total) by mouth 2 (two) times daily. 180 tablet 3  . metoprolol tartrate (LOPRESSOR) 25 MG tablet Take 1 tablet (25 mg total) by mouth 2 (two) times daily. 180 tablet 3  . pantoprazole (PROTONIX) 40 MG tablet Take 1 tablet (40 mg total) by mouth daily. 90 tablet 3   No current facility-administered medications on file prior to visit.      Objective:  Objective  Physical Exam  Constitutional: Nicole Watkins is oriented to person, place, and time. Nicole Watkins appears well-developed and well-nourished.  HENT:  Head: Normocephalic and atraumatic.  Eyes: Conjunctivae and EOM are normal.  Neck: Normal range of motion. Neck supple. No JVD present. Carotid bruit is not present. No thyromegaly present.  Cardiovascular: Normal rate, regular rhythm and normal heart sounds.   No murmur heard. Pulmonary/Chest: Effort normal and breath sounds normal. No respiratory distress. Nicole Watkins has no wheezes. Nicole Watkins has no rales. Nicole Watkins exhibits no tenderness.  Musculoskeletal: Nicole Watkins exhibits no edema.  Neurological: Nicole Watkins is alert and oriented to person, place, and time.  Psychiatric: Nicole Watkins has a normal mood and affect. Her behavior is normal. Judgment and thought content normal.  Nursing note and vitals reviewed.  BP (!) 170/96 (BP Location: Right Arm, Patient Position: Sitting, Cuff Size: Small)   Pulse 77   Temp 97.8 F (36.6 C) (Oral)   Wt 129 lb 9.6 oz (58.8 kg)   SpO2 97%   BMI 22.25 kg/m  Wt Readings from Last 3 Encounters:  10/13/15 125 lb (56.7 kg)   10/13/15 129 lb 9.6 oz (58.8 kg)  09/28/15 125 lb 14.1 oz (57.1 kg)     Lab Results  Component Value Date   WBC 8.6 10/02/2015   HGB 13.0 10/02/2015   HCT 40.1 10/02/2015   PLT 214 10/02/2015   GLUCOSE 116 (H) 10/02/2015   CHOL 204 (H) 07/07/2015   TRIG 89.0 07/07/2015   HDL 61.30 07/07/2015   LDLCALC 125 (H) 07/07/2015   ALT 12 07/07/2015   AST 18 07/07/2015   NA 130 (L) 10/02/2015   K 4.3 10/02/2015   CL 97 (L) 10/02/2015   CREATININE 0.90 10/02/2015   BUN 21 (H) 10/02/2015   CO2 26 10/02/2015   TSH 2.843 09/28/2015   INR 1.09 09/29/2015    No results found.   Assessment & Plan:  Plan  I have changed Nicole Watkins's nitroGLYCERIN and ALPRAZolam. I am also having her start on phenazopyridine and ciprofloxacin. Additionally, I am having her maintain her Cholecalciferol, cyanocobalamin, aspirin, docusate sodium, metoprolol tartrate, lisinopril, and pantoprazole. We will continue to administer oxybutynin.  Meds ordered this encounter  Medications  . nitroGLYCERIN (NITROSTAT) 0.4 MG SL tablet    Sig: Place 1 tablet (0.4 mg total) under the tongue every 5 (five) minutes as needed for chest pain. Reported on 08/10/2015    Dispense:  30 tablet    Refill:  0  . ALPRAZolam (XANAX) 0.25 MG tablet    Sig: Take 1 tablet (0.25 mg total) by mouth 2 (two) times daily as needed for anxiety.    Dispense:  60 tablet    Refill:  0  . phenazopyridine (PYRIDIUM) 200 MG tablet    Sig: Take 1 tablet (200 mg total) by mouth 3 (three) times daily as needed for pain.    Dispense:  10 tablet    Refill:  0  . ciprofloxacin (CIPRO) 500 MG tablet    Sig: Take 1 tablet (500 mg total) by mouth 2 (two) times daily.    Dispense:  20 tablet    Refill:  0  . oxybutynin (DITROPAN-XL) 24 hr tablet 5 mg    Problem List Items Addressed This Visit      Unprioritized   Generalized anxiety disorder   Relevant Medications   ALPRAZolam (XANAX) 0.25 MG tablet    Other Visit Diagnoses    Frequency of  urination    -  Primary   Relevant Medications   oxybutynin (DITROPAN-XL) 24 hr tablet 5 mg (Start on 10/13/2015 10:00 PM)   Other Relevant Orders   POCT Urinalysis Dipstick (Automated) (Completed)   Urine Culture   Dysuria       Relevant Orders   POCT Urinalysis Dipstick (Automated) (Completed)   Urine Culture   Urinary tract infection, site not specified       Relevant Medications   phenazopyridine (PYRIDIUM) 200 MG tablet   ciprofloxacin (CIPRO) 500 MG tablet      Follow-up: Return in about 3 months (around 01/13/2016).  Donato SchultzYvonne R Lowne Chase, DO

## 2015-10-13 NOTE — Progress Notes (Signed)
Pre visit review using our clinic review tool, if applicable. No additional management support is needed unless otherwise documented below in the visit note. 

## 2015-10-13 NOTE — Telephone Encounter (Signed)
FYI  KP

## 2015-10-13 NOTE — Telephone Encounter (Signed)
Pt called in to request a antibiotic for possible UTI. Informed pt that she would need to be seen. Scheduled pt to come in. Pt also mention being discharged for the hospital a week ago. Scheduled pt for hospital FU then pt called back to cxl hos fu appt. Pt says that she cant get transportation that early because her daughter has other appts. Scheduled pt to come in at 1:00 with PCP.

## 2015-10-14 DIAGNOSIS — K219 Gastro-esophageal reflux disease without esophagitis: Secondary | ICD-10-CM | POA: Diagnosis not present

## 2015-10-14 DIAGNOSIS — Z48812 Encounter for surgical aftercare following surgery on the circulatory system: Secondary | ICD-10-CM | POA: Diagnosis not present

## 2015-10-14 DIAGNOSIS — I10 Essential (primary) hypertension: Secondary | ICD-10-CM | POA: Diagnosis not present

## 2015-10-14 DIAGNOSIS — F411 Generalized anxiety disorder: Secondary | ICD-10-CM | POA: Diagnosis not present

## 2015-10-14 DIAGNOSIS — I421 Obstructive hypertrophic cardiomyopathy: Secondary | ICD-10-CM | POA: Diagnosis not present

## 2015-10-14 DIAGNOSIS — I2510 Atherosclerotic heart disease of native coronary artery without angina pectoris: Secondary | ICD-10-CM | POA: Diagnosis not present

## 2015-10-15 DIAGNOSIS — F411 Generalized anxiety disorder: Secondary | ICD-10-CM | POA: Diagnosis not present

## 2015-10-15 DIAGNOSIS — I2510 Atherosclerotic heart disease of native coronary artery without angina pectoris: Secondary | ICD-10-CM | POA: Diagnosis not present

## 2015-10-15 DIAGNOSIS — I10 Essential (primary) hypertension: Secondary | ICD-10-CM | POA: Diagnosis not present

## 2015-10-15 DIAGNOSIS — I421 Obstructive hypertrophic cardiomyopathy: Secondary | ICD-10-CM | POA: Diagnosis not present

## 2015-10-15 DIAGNOSIS — Z48812 Encounter for surgical aftercare following surgery on the circulatory system: Secondary | ICD-10-CM | POA: Diagnosis not present

## 2015-10-15 DIAGNOSIS — K219 Gastro-esophageal reflux disease without esophagitis: Secondary | ICD-10-CM | POA: Diagnosis not present

## 2015-10-15 LAB — URINE CULTURE

## 2015-10-16 ENCOUNTER — Ambulatory Visit: Payer: Medicare Other | Admitting: Family Medicine

## 2015-10-16 DIAGNOSIS — I421 Obstructive hypertrophic cardiomyopathy: Secondary | ICD-10-CM | POA: Diagnosis not present

## 2015-10-16 DIAGNOSIS — I10 Essential (primary) hypertension: Secondary | ICD-10-CM | POA: Diagnosis not present

## 2015-10-16 DIAGNOSIS — F411 Generalized anxiety disorder: Secondary | ICD-10-CM | POA: Diagnosis not present

## 2015-10-16 DIAGNOSIS — K219 Gastro-esophageal reflux disease without esophagitis: Secondary | ICD-10-CM | POA: Diagnosis not present

## 2015-10-16 DIAGNOSIS — Z48812 Encounter for surgical aftercare following surgery on the circulatory system: Secondary | ICD-10-CM | POA: Diagnosis not present

## 2015-10-16 DIAGNOSIS — I2510 Atherosclerotic heart disease of native coronary artery without angina pectoris: Secondary | ICD-10-CM | POA: Diagnosis not present

## 2015-10-19 ENCOUNTER — Telehealth: Payer: Self-pay

## 2015-10-19 MED ORDER — OXYBUTYNIN CHLORIDE ER 5 MG PO TB24: 5.0000 mg | ORAL_TABLET | Freq: Every day | ORAL | 5 refills | Status: DC

## 2015-10-19 NOTE — Telephone Encounter (Signed)
Patient called said her oxybutynin (DITROPAN-XL) 24 hr tablet 5 mg was never sent to the pharmacy with her other medications.  [phone] call back number

## 2015-10-19 NOTE — Telephone Encounter (Signed)
Patient aware Rx has been faxed.      KP 

## 2015-10-20 DIAGNOSIS — K219 Gastro-esophageal reflux disease without esophagitis: Secondary | ICD-10-CM | POA: Diagnosis not present

## 2015-10-20 DIAGNOSIS — Z48812 Encounter for surgical aftercare following surgery on the circulatory system: Secondary | ICD-10-CM | POA: Diagnosis not present

## 2015-10-20 DIAGNOSIS — F411 Generalized anxiety disorder: Secondary | ICD-10-CM | POA: Diagnosis not present

## 2015-10-20 DIAGNOSIS — I2510 Atherosclerotic heart disease of native coronary artery without angina pectoris: Secondary | ICD-10-CM | POA: Diagnosis not present

## 2015-10-20 DIAGNOSIS — I421 Obstructive hypertrophic cardiomyopathy: Secondary | ICD-10-CM | POA: Diagnosis not present

## 2015-10-20 DIAGNOSIS — I10 Essential (primary) hypertension: Secondary | ICD-10-CM | POA: Diagnosis not present

## 2015-10-21 DIAGNOSIS — I2510 Atherosclerotic heart disease of native coronary artery without angina pectoris: Secondary | ICD-10-CM | POA: Diagnosis not present

## 2015-10-21 DIAGNOSIS — Z48812 Encounter for surgical aftercare following surgery on the circulatory system: Secondary | ICD-10-CM | POA: Diagnosis not present

## 2015-10-21 DIAGNOSIS — K219 Gastro-esophageal reflux disease without esophagitis: Secondary | ICD-10-CM | POA: Diagnosis not present

## 2015-10-21 DIAGNOSIS — I421 Obstructive hypertrophic cardiomyopathy: Secondary | ICD-10-CM | POA: Diagnosis not present

## 2015-10-21 DIAGNOSIS — F411 Generalized anxiety disorder: Secondary | ICD-10-CM | POA: Diagnosis not present

## 2015-10-21 DIAGNOSIS — I10 Essential (primary) hypertension: Secondary | ICD-10-CM | POA: Diagnosis not present

## 2015-10-22 DIAGNOSIS — I421 Obstructive hypertrophic cardiomyopathy: Secondary | ICD-10-CM | POA: Diagnosis not present

## 2015-10-22 DIAGNOSIS — I10 Essential (primary) hypertension: Secondary | ICD-10-CM | POA: Diagnosis not present

## 2015-10-22 DIAGNOSIS — F411 Generalized anxiety disorder: Secondary | ICD-10-CM | POA: Diagnosis not present

## 2015-10-22 DIAGNOSIS — Z48812 Encounter for surgical aftercare following surgery on the circulatory system: Secondary | ICD-10-CM | POA: Diagnosis not present

## 2015-10-22 DIAGNOSIS — K219 Gastro-esophageal reflux disease without esophagitis: Secondary | ICD-10-CM | POA: Diagnosis not present

## 2015-10-22 DIAGNOSIS — I2510 Atherosclerotic heart disease of native coronary artery without angina pectoris: Secondary | ICD-10-CM | POA: Diagnosis not present

## 2015-10-23 NOTE — Progress Notes (Signed)
HPI: FU coronary artery disease. Previously cared for at [facility]. Patient suffered a non-ST elevation myocardial infarction in January 2015. She had a drug-eluting stent to the Lcx but I do not have details available. She also carries a diagnosis of supraventricular tachycardia and left bundle branch block. Carotid Dopplers December 2016 showed no hemodynamically significant stenosis. Abdominal ultrasound December 2016 showed mild abdominal aortic ectasia at 2.8 cm. Follow-up recommended 5 years. She has seen psychiatry for problems with stress related to her previous myocardial infarction and loss of her husband. Echo 09/29/15 showed normal LV systolic function, focal septal hypertrophy, mild AS/mild AI, SAM, mild MR, moderate LAE. Patient recently had a pacemaker placed in August 2017 because of complete heart block documented on implantable loop monitor. Procedure was complicated by pneumothorax. This required chest tube placement. Since last seen, She has some dyspnea on exertion but no orthopnea, PND, pedal edema, chest pain or syncope.  Current Outpatient Prescriptions  Medication Sig Dispense Refill  . ALPRAZolam (XANAX) 0.25 MG tablet Take 1 tablet (0.25 mg total) by mouth 2 (two) times daily as needed for anxiety. 60 tablet 0  . aspirin 81 MG tablet Take 81 mg by mouth every evening. Reported on 08/10/2015    . Cholecalciferol 4000 UNITS CAPS Take 4,000 Units by mouth every evening. Reported on 08/10/2015    . cyanocobalamin 2000 MCG tablet Take 500 mcg by mouth every evening. Reported on 08/10/2015    . docusate sodium (COLACE) 100 MG capsule Take 100 mg by mouth 2 (two) times daily. Reported on 08/10/2015    . lisinopril (PRINIVIL,ZESTRIL) 20 MG tablet Take 1 tablet (20 mg total) by mouth 2 (two) times daily. 180 tablet 3  . metoprolol tartrate (LOPRESSOR) 25 MG tablet Take 1 tablet (25 mg total) by mouth 2 (two) times daily. 180 tablet 3  . nitroGLYCERIN (NITROSTAT) 0.4 MG SL tablet  Place 1 tablet (0.4 mg total) under the tongue every 5 (five) minutes as needed for chest pain. Reported on 08/10/2015 30 tablet 0  . pantoprazole (PROTONIX) 40 MG tablet Take 1 tablet (40 mg total) by mouth daily. 90 tablet 3  . oxybutynin (DITROPAN-XL) 5 MG 24 hr tablet Take 1 tablet (5 mg total) by mouth at bedtime. (Patient not taking: Reported on 10/28/2015) 30 tablet 5   No current facility-administered medications for this visit.      Past Medical History:  Diagnosis Date  . Anxiety   . Arthritis   . CAD (coronary artery disease)   . CHB (complete heart block) (HCC)    a. s/p PPM on 09/29/15  . GERD (gastroesophageal reflux disease)   . Hyperlipidemia   . Hypertension   . LBBB (left bundle branch block)   . Status post placement of cardiac pacemaker    a. 09/2015: Medtronic Advisa MRI model [id number] (serial number [id number]) pacemaker  . Thyroid-related proptosis     Past Surgical History:  Procedure Laterality Date  . CARDIAC SURGERY     stent placed 03/04/13  . EP IMPLANTABLE DEVICE N/A 06/17/2015   Procedure: Loop Recorder Insertion;  Surgeon: [name], MD;  Location: [facility] INVASIVE CV LAB;  Service: Cardiovascular;  Laterality: N/A;  . EP IMPLANTABLE DEVICE N/A 09/29/2015   Procedure: Pacemaker Implant;  Surgeon: [name], MD;  Location: [facility] INVASIVE CV LAB;  Service: Cardiovascular;  Laterality: N/A;  . No family history      Social History   Social History  . Marital status:  Widowed    Spouse name: N/A  . Number of children: 1  . Years of education: N/A   Occupational History  . Not on file.   Social History Main Topics  . Smoking status: Former Games developermoker  . Smokeless tobacco: Never Used  . Alcohol use No  . Drug use: Unknown  . Sexual activity: Not on file   Other Topics Concern  . Not on file   Social History Narrative  . No narrative on file    Family History  Problem Relation Age of Onset  . Arthritis Mother   . Hyperlipidemia Mother   .  Hypertension Mother   . Hypothyroidism Mother   . Uterine cancer Maternal Grandmother     ROS: no fevers or chills, productive cough, hemoptysis, dysphasia, odynophagia, melena, hematochezia, dysuria, hematuria, rash, seizure activity, orthopnea, PND, pedal edema, claudication. Remaining systems are negative.  Physical Exam: Well-developed well-nourished in no acute distress.  Skin is warm and dry.  HEENT is normal.  Neck is supple.  Chest Diminished breath sounds; Pacer site without evidence of infection. Cardiovascular exam is regular rate and rhythm. 2/6 systolic murmur left sternal border. Abdominal exam nontender or distended. No masses palpated. Extremities show no edema. neuro grossly intact    A/P  1 abdominal aortic aneurysm-follow-up abdominal ultrasound December 2021.  2 coronary artery disease-continue aspirin and resume statin.  3 hypertension-blood pressure is elevated. Increase metoprolol to 50 mg twice a day and follow.  4 hyperlipidemia-Resume Pravachol 40 mg daily. Check lipids and liver in 4 weeks. Patient did not tolerate Crestor or Lipitor.  5 status post pacemaker-follow-up electrophysiology.  6 hypertrophic obstructive cardiomyopathy-continue beta blocker. Previous treadmill showed normal blood pressure with exercise. Previous monitor did not show nonsustained ventricular tachycardia. No Family history of sudden death. Plan follow-up echoes in the future. I explained to her that her daughter should be screened.  Olga MillersBrian Fannie Alomar, MD

## 2015-10-27 DIAGNOSIS — I10 Essential (primary) hypertension: Secondary | ICD-10-CM | POA: Diagnosis not present

## 2015-10-27 DIAGNOSIS — Z48812 Encounter for surgical aftercare following surgery on the circulatory system: Secondary | ICD-10-CM | POA: Diagnosis not present

## 2015-10-27 DIAGNOSIS — I421 Obstructive hypertrophic cardiomyopathy: Secondary | ICD-10-CM | POA: Diagnosis not present

## 2015-10-27 DIAGNOSIS — K219 Gastro-esophageal reflux disease without esophagitis: Secondary | ICD-10-CM | POA: Diagnosis not present

## 2015-10-27 DIAGNOSIS — I2510 Atherosclerotic heart disease of native coronary artery without angina pectoris: Secondary | ICD-10-CM | POA: Diagnosis not present

## 2015-10-27 DIAGNOSIS — F411 Generalized anxiety disorder: Secondary | ICD-10-CM | POA: Diagnosis not present

## 2015-10-28 ENCOUNTER — Ambulatory Visit (INDEPENDENT_AMBULATORY_CARE_PROVIDER_SITE_OTHER): Payer: Medicare Other | Admitting: Cardiology

## 2015-10-28 ENCOUNTER — Encounter: Payer: Self-pay | Admitting: Cardiology

## 2015-10-28 VITALS — BP 172/106 | HR 80 | Ht 64.0 in | Wt 129.4 lb

## 2015-10-28 DIAGNOSIS — I2510 Atherosclerotic heart disease of native coronary artery without angina pectoris: Secondary | ICD-10-CM | POA: Diagnosis not present

## 2015-10-28 DIAGNOSIS — I714 Abdominal aortic aneurysm, without rupture, unspecified: Secondary | ICD-10-CM

## 2015-10-28 DIAGNOSIS — R55 Syncope and collapse: Secondary | ICD-10-CM

## 2015-10-28 DIAGNOSIS — Z79899 Other long term (current) drug therapy: Secondary | ICD-10-CM

## 2015-10-28 DIAGNOSIS — I10 Essential (primary) hypertension: Secondary | ICD-10-CM | POA: Diagnosis not present

## 2015-10-28 DIAGNOSIS — Z9861 Coronary angioplasty status: Secondary | ICD-10-CM | POA: Diagnosis not present

## 2015-10-28 DIAGNOSIS — E785 Hyperlipidemia, unspecified: Secondary | ICD-10-CM | POA: Diagnosis not present

## 2015-10-28 MED ORDER — PRAVASTATIN SODIUM 40 MG PO TABS: 40.0000 mg | ORAL_TABLET | Freq: Every evening | ORAL | 3 refills | Status: DC

## 2015-10-28 MED ORDER — METOPROLOL TARTRATE 50 MG PO TABS: 50.0000 mg | ORAL_TABLET | Freq: Two times a day (BID) | ORAL | 3 refills | Status: DC

## 2015-10-28 NOTE — Patient Instructions (Signed)
Your physician has recommended you make the following change in your medication: INCREASE  METOPROLOL TO  50 MG  TWICE   A  DAY  AND  START PRAVASTATIN  40 MG  EVERY  BEDTIME  Your physician recommends that you return for lab work in: IN  4 WEEKS  FASTING LIPID AND   LIVER   Your physician recommends that you schedule a follow-up appointment in:  3 MONTHS  WITH  DR  Jens SomRENSHAW

## 2015-10-28 NOTE — Addendum Note (Signed)
Addended by: Scherrie BatemanYORK, Augustino Savastano E on: 10/28/2015 09:03 AM   Modules accepted: Orders

## 2015-10-29 ENCOUNTER — Telehealth: Payer: Self-pay | Admitting: Family Medicine

## 2015-10-29 DIAGNOSIS — I421 Obstructive hypertrophic cardiomyopathy: Secondary | ICD-10-CM | POA: Diagnosis not present

## 2015-10-29 DIAGNOSIS — Z48812 Encounter for surgical aftercare following surgery on the circulatory system: Secondary | ICD-10-CM | POA: Diagnosis not present

## 2015-10-29 DIAGNOSIS — F411 Generalized anxiety disorder: Secondary | ICD-10-CM | POA: Diagnosis not present

## 2015-10-29 DIAGNOSIS — I10 Essential (primary) hypertension: Secondary | ICD-10-CM | POA: Diagnosis not present

## 2015-10-29 DIAGNOSIS — K219 Gastro-esophageal reflux disease without esophagitis: Secondary | ICD-10-CM | POA: Diagnosis not present

## 2015-10-29 DIAGNOSIS — I2510 Atherosclerotic heart disease of native coronary artery without angina pectoris: Secondary | ICD-10-CM | POA: Diagnosis not present

## 2015-10-29 NOTE — Telephone Encounter (Signed)
Caller name: Tresa EndoKelly  Relation to pt:  PT from [facility]  Call back number: [phone]   Reason for call:  Requesting verbal orders for Home Health and Physical Therapy 3 to 4x weekly

## 2015-10-29 NOTE — Telephone Encounter (Signed)
LMOM w/ verbal orders.  

## 2015-11-03 DIAGNOSIS — I10 Essential (primary) hypertension: Secondary | ICD-10-CM | POA: Diagnosis not present

## 2015-11-03 DIAGNOSIS — F411 Generalized anxiety disorder: Secondary | ICD-10-CM | POA: Diagnosis not present

## 2015-11-03 DIAGNOSIS — Z48812 Encounter for surgical aftercare following surgery on the circulatory system: Secondary | ICD-10-CM | POA: Diagnosis not present

## 2015-11-03 DIAGNOSIS — K219 Gastro-esophageal reflux disease without esophagitis: Secondary | ICD-10-CM | POA: Diagnosis not present

## 2015-11-03 DIAGNOSIS — I421 Obstructive hypertrophic cardiomyopathy: Secondary | ICD-10-CM | POA: Diagnosis not present

## 2015-11-03 DIAGNOSIS — I2510 Atherosclerotic heart disease of native coronary artery without angina pectoris: Secondary | ICD-10-CM | POA: Diagnosis not present

## 2015-11-04 ENCOUNTER — Ambulatory Visit (INDEPENDENT_AMBULATORY_CARE_PROVIDER_SITE_OTHER): Payer: Medicare Other | Admitting: Internal Medicine

## 2015-11-04 ENCOUNTER — Encounter: Payer: Self-pay | Admitting: Internal Medicine

## 2015-11-04 VITALS — BP 184/108 | HR 63 | Ht 64.0 in | Wt 128.6 lb

## 2015-11-04 DIAGNOSIS — I10 Essential (primary) hypertension: Secondary | ICD-10-CM

## 2015-11-04 DIAGNOSIS — R55 Syncope and collapse: Secondary | ICD-10-CM | POA: Diagnosis not present

## 2015-11-04 DIAGNOSIS — Z959 Presence of cardiac and vascular implant and graft, unspecified: Secondary | ICD-10-CM

## 2015-11-04 LAB — CUP PACEART INCLINIC DEVICE CHECK
Battery Voltage: 3.08 V
Brady Statistic AP VS Percent: 21 %
Brady Statistic AS VP Percent: 0.23 %
Brady Statistic RA Percent Paced: 29.68 %
Brady Statistic RV Percent Paced: 8.91 %
Date Time Interrogation Session: 20170906141221
Implantable Lead Implant Date: 20170801
Implantable Lead Model: 5076
Implantable Lead Model: 5076
Lead Channel Impedance Value: 513 Ohm
Lead Channel Pacing Threshold Amplitude: 0.375 V
Lead Channel Pacing Threshold Pulse Width: 0.4 ms
Lead Channel Sensing Intrinsic Amplitude: 2.125 mV
Lead Channel Sensing Intrinsic Amplitude: 31.625 mV
Lead Channel Setting Pacing Amplitude: 2 V
Lead Channel Setting Sensing Sensitivity: 2.8 mV
MDC IDC LEAD IMPLANT DT: 20170801
MDC IDC LEAD LOCATION: 753859
MDC IDC LEAD LOCATION: 753860
MDC IDC MSMT BATTERY REMAINING LONGEVITY: 154 mo
MDC IDC MSMT LEADCHNL RA IMPEDANCE VALUE: 342 Ohm
MDC IDC MSMT LEADCHNL RA IMPEDANCE VALUE: 418 Ohm
MDC IDC MSMT LEADCHNL RV IMPEDANCE VALUE: 608 Ohm
MDC IDC MSMT LEADCHNL RV PACING THRESHOLD AMPLITUDE: 0.625 V
MDC IDC MSMT LEADCHNL RV PACING THRESHOLD PULSEWIDTH: 0.4 ms
MDC IDC SET LEADCHNL RV PACING AMPLITUDE: 2.5 V
MDC IDC SET LEADCHNL RV PACING PULSEWIDTH: 0.4 ms
MDC IDC STAT BRADY AP VP PERCENT: 8.68 %
MDC IDC STAT BRADY AS VS PERCENT: 70.09 %

## 2015-11-04 MED ORDER — AMLODIPINE BESYLATE 5 MG PO TABS: 2.5000 mg | ORAL_TABLET | Freq: Every day | ORAL | 3 refills | Status: DC

## 2015-11-04 NOTE — Patient Instructions (Signed)
Medication Instructions:  Your physician has recommended you make the following change in your medication:  1) Start Amlodipine 5mg  ---take 1/2 tablet daily     Labwork: None ordered   Testing/Procedures: None ordered   Follow-Up: Your physician wants you to follow-up in: 12 months with Dr Jacquiline DoeAllred You will receive a reminder letter in the mail two months in advance. If you don't receive a letter, please call our office to schedule the follow-up appointment.   Remote monitoring is used to monitor your Pacemaker from home. This monitoring reduces the number of office visits required to check your device to one time per year. It allows us to keep an eye on the functioning of your device to ensure it is working properly. You are scheduled for a device check from home on 02/03/16. You may send your transmission at any time that day. If you have a wireless device, the transmission will be sent automatically. After your physician reviews your transmission, you will receive a postcard with your next transmission date.     Any Other Special Instructions Will Be Listed Below (If Applicable).     If you need a refill on your cardiac medications before your next appointment, please call your pharmacy.

## 2015-11-04 NOTE — Progress Notes (Signed)
PCP: Donato SchultzYvonne R Lowne Chase, Nicole Watkins Primary Cardiologist:  Dr Yolonda Kidarenshaw  [name] is a 75 y.o. female who presents today for routine electrophysiology followup.  Since her recent PPM implant, the patient reports doing very well.  Her energy and SOB are much improved.  No further syncope.  Today, she denies symptoms of palpitations, chest pain,   lower extremity edema, dizziness, presyncope, or syncope.  The patient is otherwise without complaint today.   Past Medical History:  Diagnosis Date  . Anxiety   . Arthritis   . CAD (coronary artery disease)   . CHB (complete heart block) (HCC)    a. s/p PPM on 09/29/15  . GERD (gastroesophageal reflux disease)   . Hyperlipidemia   . Hypertension   . LBBB (left bundle branch block)   . Status post placement of cardiac pacemaker    a. 09/2015: Medtronic Advisa MRI model [id number] (serial number [id number]) pacemaker  . Thyroid-related proptosis    Past Surgical History:  Procedure Laterality Date  . CARDIAC SURGERY     stent placed 03/04/13  . EP IMPLANTABLE DEVICE N/A 06/17/2015   Procedure: Loop Recorder Insertion;  Surgeon: Duke SalviaSteven C Klein, MD;  Location[facility] INVASIVE CV LAB;  Service: Cardiovascular;  Laterality: N/A;  . EP IMPLANTABLE DEVICE N/A 09/29/2015   Procedure: Pacemaker Implant;  Surgeon: Hillis RangeJames Pamelyn Bancroft, MD;  Location: [facility] INVASIVE CV LAB;  Service: Cardiovascular;  Laterality: N/A;  . No family history      ROS- all systems are reviewed and negative except as per HPI above  Current Outpatient Prescriptions  Medication Sig Dispense Refill  . ALPRAZolam (XANAX) 0.25 MG tablet Take 1 tablet (0.25 mg total) by mouth 2 (two) times daily as needed for anxiety. 60 tablet 0  . aspirin 81 MG tablet Take 81 mg by mouth every evening. Reported on 08/10/2015    . Cholecalciferol 4000 UNITS CAPS Take 4,000 Units by mouth every evening. Reported on 08/10/2015    . cyanocobalamin 2000 MCG tablet Take 500 mcg by mouth every evening. Reported on 08/10/2015     . docusate sodium (COLACE) 100 MG capsule Take 100 mg by mouth 2 (two) times daily. Reported on 08/10/2015    . lisinopril (PRINIVIL,ZESTRIL) 20 MG tablet Take 1 tablet (20 mg total) by mouth 2 (two) times daily. 180 tablet 3  . metoprolol (LOPRESSOR) 50 MG tablet Take 1 tablet (50 mg total) by mouth 2 (two) times daily. 180 tablet 3  . nitroGLYCERIN (NITROSTAT) 0.4 MG SL tablet Place 1 tablet (0.4 mg total) under the tongue every 5 (five) minutes as needed for chest pain. Reported on 08/10/2015 30 tablet 0  . oxybutynin (DITROPAN-XL) 5 MG 24 hr tablet Take 1 tablet (5 mg total) by mouth at bedtime. 30 tablet 5  . pantoprazole (PROTONIX) 40 MG tablet Take 1 tablet (40 mg total) by mouth daily. 90 tablet 3  . pravastatin (PRAVACHOL) 40 MG tablet Take 1 tablet (40 mg total) by mouth every evening. 90 tablet 3  . amLODipine (NORVASC) 5 MG tablet Take 0.5 tablets (2.5 mg total) by mouth daily. 180 tablet 3   No current facility-administered medications for this visit.     Physical Exam: Vitals:   11/04/15 1237  BP: (!) 184/108  Pulse: 63  SpO2: 98%  Weight: 128 lb 9.6 oz (58.3 kg)  Height: 5\' 4"  (1.626 m)    GEN- The patient is well appearing, alert and oriented x 3 today.   Head- normocephalic, atraumatic Eyes-  Sclera clear, conjunctiva pink Ears- hearing intact Oropharynx- clear Lungs- Clear to ausculation bilaterally, normal work of breathing Chest- pacemaker pocket is well healed Heart- Regular rate and rhythm, no murmurs, rubs or gallops, PMI not laterally displaced GI- soft, NT, ND, + BS Extremities- no clubbing, cyanosis, or edema  Pacemaker interrogation- reviewed in detail today,  See PACEART report  Assessment and Plan:  1. Transient complete heart block with syncope Normal pacemaker function See Pace Art report No changes today  2. S/p PTX Resolved  3. HTN Elevated BP today Will start norvasc 2.5mg  daily (she prefers this over further metoprolol increase  today)  4. HCM Stable No change required today  carelink Follow-up with Dr [name] as scheduled Return to see me in 1 year  [name] MD, [facility] 11/04/2015 3:06 PM

## 2015-11-06 DIAGNOSIS — F411 Generalized anxiety disorder: Secondary | ICD-10-CM | POA: Diagnosis not present

## 2015-11-06 DIAGNOSIS — I2510 Atherosclerotic heart disease of native coronary artery without angina pectoris: Secondary | ICD-10-CM | POA: Diagnosis not present

## 2015-11-06 DIAGNOSIS — I421 Obstructive hypertrophic cardiomyopathy: Secondary | ICD-10-CM | POA: Diagnosis not present

## 2015-11-06 DIAGNOSIS — K219 Gastro-esophageal reflux disease without esophagitis: Secondary | ICD-10-CM | POA: Diagnosis not present

## 2015-11-06 DIAGNOSIS — I10 Essential (primary) hypertension: Secondary | ICD-10-CM | POA: Diagnosis not present

## 2015-11-06 DIAGNOSIS — Z48812 Encounter for surgical aftercare following surgery on the circulatory system: Secondary | ICD-10-CM | POA: Diagnosis not present

## 2015-11-25 ENCOUNTER — Other Ambulatory Visit (INDEPENDENT_AMBULATORY_CARE_PROVIDER_SITE_OTHER): Payer: Medicare Other

## 2015-11-25 DIAGNOSIS — Z79899 Other long term (current) drug therapy: Secondary | ICD-10-CM

## 2015-11-25 DIAGNOSIS — E785 Hyperlipidemia, unspecified: Secondary | ICD-10-CM

## 2015-11-25 LAB — LIPID PANEL
CHOLESTEROL: 172 mg/dL (ref 125–200)
HDL: 42 mg/dL — ABNORMAL LOW (ref >=46)
LDL Cholesterol: 109 mg/dL (ref <130)
TRIGLYCERIDES: 106 mg/dL (ref <150)
Total CHOL/HDL Ratio: 4.1 Ratio (ref <=5.0)
VLDL: 21 mg/dL (ref <30)

## 2015-11-25 LAB — HEPATIC FUNCTION PANEL
ALBUMIN: 4 g/dL (ref 3.6–5.1)
ALT: 14 U/L (ref 6–29)
AST: 21 U/L (ref 10–35)
Alkaline Phosphatase: 51 U/L (ref 33–130)
BILIRUBIN DIRECT: 0.1 mg/dL (ref <=0.2)
BILIRUBIN TOTAL: 0.5 mg/dL (ref 0.2–1.2)
Indirect Bilirubin: 0.4 mg/dL (ref 0.2–1.2)
Total Protein: 6.7 g/dL (ref 6.1–8.1)

## 2015-12-29 ENCOUNTER — Encounter: Payer: Self-pay | Admitting: Cardiology

## 2016-01-08 NOTE — Progress Notes (Signed)
HPI: FU coronary artery disease. Previously cared for at [facility]. Patient suffered a non-ST elevation myocardial infarction in January 2015. She had a drug-eluting stent to the Lcx but I do not have details available. She also carries a diagnosis of supraventricular tachycardia and left bundle branch block. Carotid Dopplers December 2016 showed no hemodynamically significant stenosis. Abdominal ultrasound December 2016 showed mild abdominal aortic ectasia at 2.8 cm. Follow-up recommended 5 years. She has seen psychiatry for problems with stress related to her previous myocardial infarction and loss of her husband. Echo 09/29/15 showed normal LV systolic function, focal septal hypertrophy, mild AS/mild AI, SAM, mild MR, moderate LAE. 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 Procedure was complicated by pneumothorax. This required chest tube placement. Since last seen, the patient has dyspnea with more extreme activities but not with routine activities. It is relieved with rest. It is not associated with chest pain. There is no orthopnea, PND or pedal edema. There is no syncope or palpitations. There is no exertional chest pain.   Current Outpatient Prescriptions  Medication Sig Dispense Refill  . ALPRAZolam (XANAX) 0.25 MG tablet Take 1 tablet (0.25 mg total) by mouth 2 (two) times daily as needed for anxiety. 60 tablet 0  . amLODipine (NORVASC) 5 MG tablet Take 0.5 tablets (2.5 mg total) by mouth daily. 180 tablet 3  . aspirin 81 MG tablet Take 81 mg by mouth every evening. Reported on 08/10/2015    . Cholecalciferol 4000 UNITS CAPS Take 4,000 Units by mouth every evening. Reported on 08/10/2015    . cyanocobalamin 2000 MCG tablet Take 500 mcg by mouth every evening. Reported on 08/10/2015    . docusate sodium (COLACE) 100 MG capsule Take 100 mg by mouth 2 (two) times daily. Reported on 08/10/2015    . lisinopril  (PRINIVIL,ZESTRIL) 20 MG tablet Take 1 tablet (20 mg total) by mouth 2 (two) times daily. 180 tablet 3  . metoprolol (LOPRESSOR) 50 MG tablet Take 1 tablet (50 mg total) by mouth 2 (two) times daily. (Patient taking differently: Take 75 mg by mouth 2 (two) times daily. ) 180 tablet 3  . nitroGLYCERIN (NITROSTAT) 0.4 MG SL tablet Place 1 tablet (0.4 mg total) under the tongue every 5 (five) minutes as needed for chest pain. Reported on 08/10/2015 30 tablet 0  . pantoprazole (PROTONIX) 40 MG tablet Take 1 tablet (40 mg total) by mouth daily. 90 tablet 3  . pravastatin (PRAVACHOL) 40 MG tablet Take 1 tablet (40 mg total) by mouth every evening. 90 tablet 3   No current facility-administered medications for this visit.      Past Medical History:  Diagnosis Date  . Anxiety   . Arthritis   . CAD (coronary artery disease)   . CHB (complete heart block) (HCC)    a. s/p PPM on 09/29/15  . GERD (gastroesophageal reflux disease)   . Hyperlipidemia   . Hypertension   . LBBB (left bundle branch block)   . Status post placement of cardiac pacemaker    a. 09/2015: Medtronic Advisa MRI model [id number] (serial number [id number]) pacemaker  . Thyroid-related proptosis     Past Surgical History:  Procedure Laterality Date  . CARDIAC SURGERY     stent placed 03/04/13  . EP IMPLANTABLE DEVICE N/A 06/17/2015   Procedure: Loop Recorder Insertion;  Surgeon: Duke SalviaSteven C Klein, MD;  [facility];  Service: Cardiovascular;  Laterality: N/A;  . EP IMPLANTABLE DEVICE N/A 09/29/2015   Procedure: Pacemaker Implant;  Surgeon: Hillis RangeJames [name], MD;  Location: [facility] INVASIVE CV LAB;  Service: Cardiovascular;  Laterality: N/A;  . No family history      Social History   Social History  . Marital status: Widowed    Spouse name: N/A  . Number of children: 1  . Years of education: N/A   Occupational History  . Not on file.   Social History Main Topics  . Smoking status: Former Games developermoker  . Smokeless tobacco: Never  Used  . Alcohol use No  . Drug use: No  . Sexual activity: Not on file   Other Topics Concern  . Not on file   Social History Narrative  . No narrative on file    Family History  Problem Relation Age of Onset  . Arthritis Mother   . Hyperlipidemia Mother   . Hypertension Mother   . Hypothyroidism Mother   . Uterine cancer Maternal Grandmother     ROS: no fevers or chills, productive cough, hemoptysis, dysphasia, odynophagia, melena, hematochezia, dysuria, hematuria, rash, seizure activity, orthopnea, PND, pedal edema, claudication. Remaining systems are negative.  Physical Exam: Well-developed well-nourished in no acute distress.  Skin is warm and dry.  HEENT is normal.  Neck is supple.  Chest is clear to auscultation with normal expansion. PM left chest  Cardiovascular exam is regular rate and rhythm. 3/6 systolic murmur Abdominal exam nontender or distended. No masses palpated. Extremities show no edema. neuro grossly intact  ECG  A/P  1 abdominal aortic aneurysm-follow-up ultrasound December 2021.  2 coronary artery disease-continue aspirin and statin.  3 hypertension-blood pressure elevated but she follows at home and typically controlled. Continue present medications and follow.  4 hyperlipidemia-continue statin.  5 prior pacemaker-management per electrophysiology.  6 hypertrophic obstructive cardiopathy-continue beta blocker. Previous treadmill showed normal blood pressure response to exercise, previous monitor did not show nonsustained ventricular tachycardia and there is no family history of sudden death. Previously discussed family members should be screened.  Olga MillersBrian Tmya Wigington, MD

## 2016-01-13 ENCOUNTER — Encounter: Payer: Self-pay | Admitting: Cardiology

## 2016-01-13 ENCOUNTER — Ambulatory Visit (INDEPENDENT_AMBULATORY_CARE_PROVIDER_SITE_OTHER): Payer: Medicare Other | Admitting: Cardiology

## 2016-01-13 VITALS — BP 163/93 | HR 72 | Ht 64.0 in | Wt 134.1 lb

## 2016-01-13 DIAGNOSIS — Z9861 Coronary angioplasty status: Secondary | ICD-10-CM | POA: Diagnosis not present

## 2016-01-13 DIAGNOSIS — E7800 Pure hypercholesterolemia, unspecified: Secondary | ICD-10-CM

## 2016-01-13 DIAGNOSIS — I2510 Atherosclerotic heart disease of native coronary artery without angina pectoris: Secondary | ICD-10-CM

## 2016-01-13 DIAGNOSIS — I714 Abdominal aortic aneurysm, without rupture, unspecified: Secondary | ICD-10-CM

## 2016-01-13 DIAGNOSIS — I10 Essential (primary) hypertension: Secondary | ICD-10-CM | POA: Diagnosis not present

## 2016-01-13 NOTE — Patient Instructions (Signed)
Your physician wants you to follow-up in: 6 MONTHS WITH DR CRENSHAW You will receive a reminder letter in the mail two months in advance. If you don't receive a letter, please call our office to schedule the follow-up appointment.   If you need a refill on your cardiac medications before your next appointment, please call your pharmacy.  

## 2016-02-03 ENCOUNTER — Ambulatory Visit (INDEPENDENT_AMBULATORY_CARE_PROVIDER_SITE_OTHER): Payer: Medicare Other | Admitting: *Deleted

## 2016-02-03 DIAGNOSIS — I442 Atrioventricular block, complete: Secondary | ICD-10-CM | POA: Diagnosis not present

## 2016-02-03 LAB — CUP PACEART REMOTE DEVICE CHECK
Brady Statistic AP VP Percent: 18.35 %
Brady Statistic AP VS Percent: 6.12 %
Brady Statistic AS VP Percent: 0.43 %
Date Time Interrogation Session: 20171205170814
Implantable Lead Implant Date: 20170801
Implantable Lead Implant Date: 20170801
Implantable Lead Model: 5076
Lead Channel Impedance Value: 361 Ohm
Lead Channel Impedance Value: 494 Ohm
Lead Channel Impedance Value: 513 Ohm
Lead Channel Sensing Intrinsic Amplitude: 1.25 mV
Lead Channel Sensing Intrinsic Amplitude: 31.625 mV
Lead Channel Sensing Intrinsic Amplitude: 31.625 mV
Lead Channel Setting Pacing Amplitude: 2 V
Lead Channel Setting Pacing Amplitude: 2.5 V
Lead Channel Setting Pacing Pulse Width: 0.4 ms
MDC IDC LEAD LOCATION: 753859
MDC IDC LEAD LOCATION: 753860
MDC IDC MSMT BATTERY REMAINING LONGEVITY: 120 mo
MDC IDC MSMT BATTERY VOLTAGE: 3.04 V
MDC IDC MSMT LEADCHNL RA PACING THRESHOLD AMPLITUDE: 0.75 V
MDC IDC MSMT LEADCHNL RA PACING THRESHOLD PULSEWIDTH: 0.4 ms
MDC IDC MSMT LEADCHNL RA SENSING INTR AMPL: 1.25 mV
MDC IDC MSMT LEADCHNL RV IMPEDANCE VALUE: 608 Ohm
MDC IDC MSMT LEADCHNL RV PACING THRESHOLD AMPLITUDE: 0.75 V
MDC IDC MSMT LEADCHNL RV PACING THRESHOLD PULSEWIDTH: 0.4 ms
MDC IDC PG IMPLANT DT: 20170801
MDC IDC SET LEADCHNL RV SENSING SENSITIVITY: 2.8 mV
MDC IDC STAT BRADY AS VS PERCENT: 75.1 %
MDC IDC STAT BRADY RA PERCENT PACED: 24.11 %
MDC IDC STAT BRADY RV PERCENT PACED: 18.67 %

## 2016-02-03 NOTE — Progress Notes (Signed)
Remote pacemaker transmission.   

## 2016-02-09 ENCOUNTER — Ambulatory Visit: Payer: Medicare Other | Admitting: Family Medicine

## 2016-02-09 ENCOUNTER — Ambulatory Visit (INDEPENDENT_AMBULATORY_CARE_PROVIDER_SITE_OTHER): Payer: Medicare Other | Admitting: Family Medicine

## 2016-02-09 VITALS — BP 142/80 | HR 67 | Temp 98.7°F | Resp 16 | Ht 64.0 in | Wt 135.8 lb

## 2016-02-09 DIAGNOSIS — F411 Generalized anxiety disorder: Secondary | ICD-10-CM

## 2016-02-09 DIAGNOSIS — E559 Vitamin D deficiency, unspecified: Secondary | ICD-10-CM | POA: Diagnosis not present

## 2016-02-09 DIAGNOSIS — R55 Syncope and collapse: Secondary | ICD-10-CM

## 2016-02-09 DIAGNOSIS — I2510 Atherosclerotic heart disease of native coronary artery without angina pectoris: Secondary | ICD-10-CM

## 2016-02-09 DIAGNOSIS — I421 Obstructive hypertrophic cardiomyopathy: Secondary | ICD-10-CM

## 2016-02-09 DIAGNOSIS — I10 Essential (primary) hypertension: Secondary | ICD-10-CM | POA: Diagnosis not present

## 2016-02-09 DIAGNOSIS — Z9861 Coronary angioplasty status: Secondary | ICD-10-CM | POA: Diagnosis not present

## 2016-02-09 DIAGNOSIS — E785 Hyperlipidemia, unspecified: Secondary | ICD-10-CM | POA: Diagnosis not present

## 2016-02-09 MED ORDER — AMLODIPINE BESYLATE 5 MG PO TABS: 2.5000 mg | ORAL_TABLET | Freq: Every day | ORAL | 3 refills | Status: DC

## 2016-02-09 MED ORDER — ALPRAZOLAM 0.25 MG PO TABS: 0.2500 mg | ORAL_TABLET | Freq: Two times a day (BID) | ORAL | 1 refills | Status: DC | PRN

## 2016-02-09 MED ORDER — METOPROLOL TARTRATE 50 MG PO TABS: 50.0000 mg | ORAL_TABLET | Freq: Two times a day (BID) | ORAL | 3 refills | Status: DC

## 2016-02-09 MED ORDER — ALPRAZOLAM 0.25 MG PO TABS: 0.2500 mg | ORAL_TABLET | Freq: Two times a day (BID) | ORAL | 0 refills | Status: DC | PRN

## 2016-02-09 MED ORDER — PRAVASTATIN SODIUM 40 MG PO TABS: 40.0000 mg | ORAL_TABLET | Freq: Every evening | ORAL | 3 refills | Status: DC

## 2016-02-09 MED ORDER — LISINOPRIL 20 MG PO TABS: 20.0000 mg | ORAL_TABLET | Freq: Two times a day (BID) | ORAL | 3 refills | Status: DC

## 2016-02-09 NOTE — Progress Notes (Signed)
Patient ID: Nicole Watkins, female    DOB: 01/15/1941  Age: 75 y.o. MRN: [id number]    Subjective:  Subjective  HPI Nicole Watkins presents for f/u bp cholesterol and anxiety. .  She is under a lot of stress because her daughter moved down here  Review of Systems  Constitutional: Negative for appetite change, diaphoresis, fatigue and unexpected weight change.  Eyes: Negative for pain, redness and visual disturbance.  Respiratory: Negative for cough, chest tightness, shortness of breath and wheezing.   Cardiovascular: Negative for chest pain, palpitations and leg swelling.  Endocrine: Negative for cold intolerance, heat intolerance, polydipsia, polyphagia and polyuria.  Genitourinary: Negative for difficulty urinating, dysuria and frequency.  Neurological: Negative for dizziness, light-headedness, numbness and headaches.    History Past Medical History:  Diagnosis Date  . Anxiety   . Arthritis   . CAD (coronary artery disease)   . CHB (complete heart block) (HCC)    a. s/p PPM on 09/29/15  . GERD (gastroesophageal reflux disease)   . Hyperlipidemia   . Hypertension   . LBBB (left bundle branch block)   . Status post placement of cardiac pacemaker    a. 09/2015: Medtronic Advisa MRI model [id number] (serial number [id number]) pacemaker  . Thyroid-related proptosis     She has a past surgical history that includes No family history; Cardiac surgery; Cardiac catheterization (N/A, 06/17/2015); and Cardiac catheterization (N/A, 09/29/2015).   Her family history includes Arthritis in her mother; Hyperlipidemia in her mother; Hypertension in her mother; Hypothyroidism in her mother; Uterine cancer in her maternal grandmother.She reports that she has quit smoking. She has never used smokeless tobacco. She reports that she does not drink alcohol or use drugs.  Current Outpatient Prescriptions on File Prior to Visit  Medication Sig Dispense Refill  . aspirin 81 MG tablet Take 81 mg  by mouth every evening. Reported on 08/10/2015    . Cholecalciferol 4000 UNITS CAPS Take 4,000 Units by mouth every evening. Reported on 08/10/2015    . cyanocobalamin 2000 MCG tablet Take 500 mcg by mouth every evening. Reported on 08/10/2015    . docusate sodium (COLACE) 100 MG capsule Take 100 mg by mouth 2 (two) times daily. Reported on 08/10/2015    . nitroGLYCERIN (NITROSTAT) 0.4 MG SL tablet Place 1 tablet (0.4 mg total) under the tongue every 5 (five) minutes as needed for chest pain. Reported on 08/10/2015 30 tablet 0   No current facility-administered medications on file prior to visit.      Objective:  Objective  Physical Exam  Constitutional: She is oriented to person, place, and time. She appears well-developed and well-nourished.  HENT:  Head: Normocephalic and atraumatic.  Eyes: Conjunctivae and EOM are normal.  Neck: Normal range of motion. Neck supple. No JVD present. Carotid bruit is not present. No thyromegaly present.  Cardiovascular: Normal rate and regular rhythm.   Murmur heard. Pulmonary/Chest: Effort normal and breath sounds normal. No respiratory distress. She has no wheezes. She has no rales. She exhibits no tenderness.  Musculoskeletal: She exhibits no edema.  Neurological: She is alert and oriented to person, place, and time.  Psychiatric: She has a normal mood and affect. Her behavior is normal. Judgment and thought content normal.  Nursing note and vitals reviewed.  BP (!) 142/80 (BP Location: Left Arm, Patient Position: Sitting, Cuff Size: Normal)   Pulse 67   Temp 98.7 F (37.1 C) (Oral)   Resp 16   Ht 5'  4" (1.626 m)   Wt 135 lb 12.8 oz (61.6 kg)   SpO2 96%   BMI 23.31 kg/m  Wt Readings from Last 3 Encounters:  02/09/16 135 lb 12.8 oz (61.6 kg)  01/13/16 134 lb 1.9 oz (60.8 kg)  11/04/15 128 lb 9.6 oz (58.3 kg)     Lab Results  Component Value Date   WBC 8.6 10/02/2015   HGB 13.0 10/02/2015   HCT 40.1 10/02/2015   PLT 214 10/02/2015    GLUCOSE 58 (L) 02/09/2016   CHOL 247 (H) 02/09/2016   TRIG 116.0 02/09/2016   HDL 58.20 02/09/2016   LDLCALC 165 (H) 02/09/2016   ALT 14 02/09/2016   AST 21 02/09/2016   NA 140 02/09/2016   K 4.5 02/09/2016   CL 104 02/09/2016   CREATININE 0.90 02/09/2016   BUN 19 02/09/2016   CO2 32 02/09/2016   TSH 2.843 09/28/2015   INR 1.09 09/29/2015    Dg Chest 2 View  Result Date: 10/13/2015 CLINICAL DATA:  Follow-up pneumothorax. Pacemaker placed 2 weeks ago. EXAM: CHEST  2 VIEW COMPARISON:  10/03/2015 FINDINGS: Left pacer remains in place, unchanged. There is hyperinflation of the lungs compatible with COPD. No visible pneumothorax. Chronic changes in the lungs. Heart is normal size. No effusions. No acute bony abnormality. IMPRESSION: COPD/chronic changes.  No acute findings.  No pneumothorax. Electronically Signed   By: Charlett NoseKevin  Dover M.D.   On: 10/13/2015 16:16     Assessment & Plan:  Plan  I have discontinued Nicole Watkins's pantoprazole. I am also having her maintain her Cholecalciferol, cyanocobalamin, aspirin, docusate sodium, nitroGLYCERIN, oxybutynin, amLODipine, lisinopril, metoprolol, pravastatin, and ALPRAZolam.  Meds ordered this encounter  Medications  . oxybutynin (DITROPAN) 5 MG tablet    Sig: Take 5 mg by mouth daily.  Marland Kitchen. DISCONTD: ALPRAZolam (XANAX) 0.25 MG tablet    Sig: Take 1 tablet (0.25 mg total) by mouth 2 (two) times daily as needed for anxiety.    Dispense:  60 tablet    Refill:  0  . amLODipine (NORVASC) 5 MG tablet    Sig: Take 0.5 tablets (2.5 mg total) by mouth daily.    Dispense:  180 tablet    Refill:  3  . lisinopril (PRINIVIL,ZESTRIL) 20 MG tablet    Sig: Take 1 tablet (20 mg total) by mouth 2 (two) times daily.    Dispense:  180 tablet    Refill:  3  . metoprolol (LOPRESSOR) 50 MG tablet    Sig: Take 1 tablet (50 mg total) by mouth 2 (two) times daily.    Dispense:  180 tablet    Refill:  3  . pravastatin (PRAVACHOL) 40 MG tablet    Sig: Take 1  tablet (40 mg total) by mouth every evening.    Dispense:  90 tablet    Refill:  3  . ALPRAZolam (XANAX) 0.25 MG tablet    Sig: Take 1 tablet (0.25 mg total) by mouth 2 (two) times daily as needed for anxiety.    Dispense:  60 tablet    Refill:  1    Problem List Items Addressed This Visit      Unprioritized   Vitamin D deficiency   Relevant Orders   VITAMIN D 25 Hydroxy (Vit-D Deficiency, Fractures) (Completed)   HTN (hypertension)   Relevant Medications   amLODipine (NORVASC) 5 MG tablet   lisinopril (PRINIVIL,ZESTRIL) 20 MG tablet   metoprolol (LOPRESSOR) 50 MG tablet   pravastatin (PRAVACHOL) 40 MG tablet  Other Relevant Orders   Comprehensive metabolic panel (Completed)   Generalized anxiety disorder - Primary   Relevant Medications   ALPRAZolam (XANAX) 0.25 MG tablet    Other Visit Diagnoses    Hyperlipidemia, unspecified hyperlipidemia type       Relevant Medications   amLODipine (NORVASC) 5 MG tablet   lisinopril (PRINIVIL,ZESTRIL) 20 MG tablet   metoprolol (LOPRESSOR) 50 MG tablet   pravastatin (PRAVACHOL) 40 MG tablet   Other Relevant Orders   Comprehensive metabolic panel (Completed)   Lipid panel (Completed)   Syncope, unspecified syncope type       Relevant Medications   amLODipine (NORVASC) 5 MG tablet   lisinopril (PRINIVIL,ZESTRIL) 20 MG tablet   metoprolol (LOPRESSOR) 50 MG tablet   pravastatin (PRAVACHOL) 40 MG tablet   HOCM (hypertrophic obstructive cardiomyopathy) (HCC)       Relevant Medications   amLODipine (NORVASC) 5 MG tablet   lisinopril (PRINIVIL,ZESTRIL) 20 MG tablet   metoprolol (LOPRESSOR) 50 MG tablet   pravastatin (PRAVACHOL) 40 MG tablet      Follow-up: Return in about 6 months (around 08/09/2016).  Donato Schultz, DO

## 2016-02-09 NOTE — Patient Instructions (Signed)
Generalized Anxiety Disorder Generalized anxiety disorder (GAD) is a mental disorder. It interferes with life functions, including relationships, work, and school. GAD is different from normal anxiety, which everyone experiences at some point in their lives in response to specific life events and activities. Normal anxiety actually helps us prepare for and get through these life events and activities. Normal anxiety goes away after the event or activity is over.  GAD causes anxiety that is not necessarily related to specific events or activities. It also causes excess anxiety in proportion to specific events or activities. The anxiety associated with GAD is also difficult to control. GAD can vary from mild to severe. People with severe GAD can have intense waves of anxiety with physical symptoms (panic attacks).  SYMPTOMS The anxiety and worry associated with GAD are difficult to control. This anxiety and worry are related to many life events and activities and also occur more days than not for 6 months or longer. People with GAD also have three or more of the following symptoms (one or more in children):  Restlessness.   Fatigue.  Difficulty concentrating.   Irritability.  Muscle tension.  Difficulty sleeping or unsatisfying sleep. DIAGNOSIS GAD is diagnosed through an assessment by your health care provider. Your health care provider will ask you questions aboutyour mood,physical symptoms, and events in your life. Your health care provider may ask you about your medical history and use of alcohol or drugs, including prescription medicines. Your health care provider may also do a physical exam and blood tests. Certain medical conditions and the use of certain substances can cause symptoms similar to those associated with GAD. Your health care provider may refer you to a mental health specialist for further evaluation. TREATMENT The following therapies are usually used to treat GAD:    Medication. Antidepressant medication usually is prescribed for long-term daily control. Antianxiety medicines may be added in severe cases, especially when panic attacks occur.   Talk therapy (psychotherapy). Certain types of talk therapy can be helpful in treating GAD by providing support, education, and guidance. A form of talk therapy called cognitive behavioral therapy can teach you healthy ways to think about and react to daily life events and activities.  Stress managementtechniques. These include yoga, meditation, and exercise and can be very helpful when they are practiced regularly. A mental health specialist can help determine which treatment is best for you. Some people see improvement with one therapy. However, other people require a combination of therapies. This information is not intended to replace advice given to you by your health care provider. Make sure you discuss any questions you have with your health care provider. Document Released: 06/11/2012 Document Revised: 03/07/2014 Document Reviewed: 06/11/2012 Elsevier Interactive Patient Education  2017 Elsevier Inc.  

## 2016-02-10 ENCOUNTER — Encounter: Payer: Self-pay | Admitting: Cardiology

## 2016-02-10 LAB — LIPID PANEL
Cholesterol: 247 mg/dL — ABNORMAL HIGH (ref 0–200)
HDL: 58.2 mg/dL (ref >39.00)
LDL CALC: 165 mg/dL — AB (ref 0–99)
NONHDL: 188.46
Total CHOL/HDL Ratio: 4
Triglycerides: 116 mg/dL (ref 0.0–149.0)
VLDL: 23.2 mg/dL (ref 0.0–40.0)

## 2016-02-10 LAB — COMPREHENSIVE METABOLIC PANEL
ALT: 14 U/L (ref 0–35)
AST: 21 U/L (ref 0–37)
Albumin: 4.3 g/dL (ref 3.5–5.2)
Alkaline Phosphatase: 56 U/L (ref 39–117)
BUN: 19 mg/dL (ref 6–23)
CHLORIDE: 104 meq/L (ref 96–112)
CO2: 32 mEq/L (ref 19–32)
CREATININE: 0.9 mg/dL (ref 0.40–1.20)
Calcium: 9.7 mg/dL (ref 8.4–10.5)
GFR: 64.75 mL/min (ref >60.00)
GLUCOSE: 58 mg/dL — AB (ref 70–99)
POTASSIUM: 4.5 meq/L (ref 3.5–5.1)
SODIUM: 140 meq/L (ref 135–145)
TOTAL PROTEIN: 7.3 g/dL (ref 6.0–8.3)
Total Bilirubin: 0.4 mg/dL (ref 0.2–1.2)

## 2016-02-10 LAB — VITAMIN D 25 HYDROXY (VIT D DEFICIENCY, FRACTURES): VITD: 57.01 ng/mL (ref 30.00–100.00)

## 2016-02-11 ENCOUNTER — Encounter: Payer: Self-pay | Admitting: Family Medicine

## 2016-02-11 DIAGNOSIS — Z79899 Other long term (current) drug therapy: Secondary | ICD-10-CM | POA: Diagnosis not present

## 2016-03-30 ENCOUNTER — Other Ambulatory Visit: Payer: Medicare Other

## 2016-04-07 ENCOUNTER — Ambulatory Visit: Payer: Medicare Other | Admitting: Family Medicine

## 2016-05-04 ENCOUNTER — Ambulatory Visit (INDEPENDENT_AMBULATORY_CARE_PROVIDER_SITE_OTHER): Payer: Medicare Other | Admitting: *Deleted

## 2016-05-04 DIAGNOSIS — I442 Atrioventricular block, complete: Secondary | ICD-10-CM | POA: Diagnosis not present

## 2016-05-04 NOTE — Progress Notes (Signed)
Remote pacemaker transmission.   

## 2016-05-05 ENCOUNTER — Other Ambulatory Visit: Payer: Self-pay | Admitting: Cardiology

## 2016-05-05 ENCOUNTER — Encounter: Payer: Self-pay | Admitting: Cardiology

## 2016-05-05 DIAGNOSIS — I421 Obstructive hypertrophic cardiomyopathy: Secondary | ICD-10-CM

## 2016-05-05 NOTE — Telephone Encounter (Signed)
REFILL 

## 2016-05-06 LAB — CUP PACEART REMOTE DEVICE CHECK
Battery Remaining Longevity: 109 mo
Brady Statistic AP VP Percent: 24.07 %
Brady Statistic AP VS Percent: 5.95 %
Brady Statistic AS VP Percent: 0.3 %
Brady Statistic AS VS Percent: 69.68 %
Brady Statistic RV Percent Paced: 24.33 %
Date Time Interrogation Session: 20180307170457
Implantable Lead Implant Date: 20170801
Implantable Lead Implant Date: 20170801
Implantable Lead Location: 753859
Implantable Lead Location: 753860
Implantable Lead Model: 5076
Lead Channel Impedance Value: 361 Ohm
Lead Channel Impedance Value: 475 Ohm
Lead Channel Impedance Value: 494 Ohm
Lead Channel Impedance Value: 570 Ohm
Lead Channel Pacing Threshold Amplitude: 0.75 V
Lead Channel Pacing Threshold Pulse Width: 0.4 ms
Lead Channel Sensing Intrinsic Amplitude: 31.625 mV
Lead Channel Sensing Intrinsic Amplitude: 31.625 mV
Lead Channel Setting Pacing Amplitude: 2 V
Lead Channel Setting Pacing Amplitude: 2.5 V
Lead Channel Setting Pacing Pulse Width: 0.4 ms
Lead Channel Setting Sensing Sensitivity: 2.8 mV
MDC IDC MSMT BATTERY VOLTAGE: 3.02 V
MDC IDC MSMT LEADCHNL RA PACING THRESHOLD AMPLITUDE: 0.625 V
MDC IDC MSMT LEADCHNL RA PACING THRESHOLD PULSEWIDTH: 0.4 ms
MDC IDC MSMT LEADCHNL RA SENSING INTR AMPL: 1.25 mV
MDC IDC MSMT LEADCHNL RA SENSING INTR AMPL: 1.25 mV
MDC IDC PG IMPLANT DT: 20170801
MDC IDC STAT BRADY RA PERCENT PACED: 29.41 %

## 2016-06-22 ENCOUNTER — Encounter: Payer: Self-pay | Admitting: Cardiology

## 2016-06-22 ENCOUNTER — Ambulatory Visit (INDEPENDENT_AMBULATORY_CARE_PROVIDER_SITE_OTHER): Payer: Medicare Other | Admitting: Cardiology

## 2016-06-22 VITALS — BP 202/93 | HR 70 | Ht 64.0 in | Wt 141.8 lb

## 2016-06-22 DIAGNOSIS — I714 Abdominal aortic aneurysm, without rupture, unspecified: Secondary | ICD-10-CM

## 2016-06-22 DIAGNOSIS — I2510 Atherosclerotic heart disease of native coronary artery without angina pectoris: Secondary | ICD-10-CM

## 2016-06-22 DIAGNOSIS — E7800 Pure hypercholesterolemia, unspecified: Secondary | ICD-10-CM

## 2016-06-22 DIAGNOSIS — I10 Essential (primary) hypertension: Secondary | ICD-10-CM

## 2016-06-22 NOTE — Progress Notes (Signed)
HPI: FU coronary artery disease. Previously cared for at [facility]. Patient suffered a non-ST elevation myocardial infarction in January 2015. She had a drug-eluting stent to the Lcx but I do not have details available. She also carries a diagnosis of supraventricular tachycardia and left bundle branch block. Carotid Dopplers December 2016 showed no hemodynamically significant stenosis. Abdominal ultrasound December 2016 showed mild abdominal aortic ectasia at 2.8 cm. Follow-up recommended 5 years. She has seen psychiatry for problems with stress related to her previous myocardial infarction and loss of her husband. Echo 09/29/15 showed normal LV systolic function, focal septal hypertrophy, mild AS/mild AI, SAM, mild MR, moderate LAE. 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 Procedure was complicated by pneumothorax. This required chest tube placement. Since last seen, the patient denies any dyspnea on exertion, orthopnea, PND, pedal edema, palpitations, syncope or chest pain.   Current Outpatient Prescriptions  Medication Sig Dispense Refill  . ALPRAZolam (XANAX) 0.25 MG tablet Take 1 tablet (0.25 mg total) by mouth 2 (two) times daily as needed for anxiety. 60 tablet 1  . amLODipine (NORVASC) 5 MG tablet Take 0.5 tablets (2.5 mg total) by mouth daily. 180 tablet 3  . aspirin 81 MG tablet Take 81 mg by mouth every evening. Reported on 08/10/2015    . Cholecalciferol 4000 UNITS CAPS Take 4,000 Units by mouth every evening. Reported on 08/10/2015    . cyanocobalamin 2000 MCG tablet Take 500 mcg by mouth every evening. Reported on 08/10/2015    . docusate sodium (COLACE) 100 MG capsule Take 100 mg by mouth 2 (two) times daily. Reported on 08/10/2015    . lisinopril (PRINIVIL,ZESTRIL) 20 MG tablet TAKE ONE TABLET TWO TIMES A DAY 180 tablet 1  . metoprolol (LOPRESSOR) 50 MG tablet Take 1 tablet (50 mg total) by mouth 2 (two)  times daily. 180 tablet 3  . nitroGLYCERIN (NITROSTAT) 0.4 MG SL tablet Place 1 tablet (0.4 mg total) under the tongue every 5 (five) minutes as needed for chest pain. Reported on 08/10/2015 30 tablet 0  . pravastatin (PRAVACHOL) 40 MG tablet Take 1 tablet (40 mg total) by mouth every evening. 90 tablet 3   No current facility-administered medications for this visit.      Past Medical History:  Diagnosis Date  . Anxiety   . Arthritis   . CAD (coronary artery disease)   . CHB (complete heart block) (HCC)    a. s/p PPM on 09/29/15  . GERD (gastroesophageal reflux disease)   . Hyperlipidemia   . Hypertension   . LBBB (left bundle branch block)   . Status post placement of cardiac pacemaker    a. 09/2015: Medtronic Advisa MRI model [id number] (serial number [id number]) pacemaker  . Thyroid-related proptosis     Past Surgical History:  Procedure Laterality Date  . CARDIAC SURGERY     stent placed 03/04/13  . EP IMPLANTABLE DEVICE N/A 06/17/2015   Procedure: Loop Recorder Insertion;  Surgeon: [name], MD;  Location: [facility] INVASIVE CV LAB;  Service: Cardiovascular;  Laterality: N/A;  . EP IMPLANTABLE DEVICE N/A 09/29/2015   Procedure: Pacemaker Implant;  Surgeon: [name], MD;  Location: [facility] INVASIVE CV LAB;  Service: Cardiovascular;  Laterality: N/A;  . No family history      Social History   Social History  . Marital status: Widowed    Spouse name: N/A  . Number of children: 1  .  Years of education: N/A   Occupational History  . Not on file.   Social History Main Topics  . Smoking status: Former Games developer  . Smokeless tobacco: Never Used  . Alcohol use No  . Drug use: No  . Sexual activity: Not on file   Other Topics Concern  . Not on file   Social History Narrative  . No narrative on file    Family History  Problem Relation Age of Onset  . Arthritis Mother   . Hyperlipidemia Mother   . Hypertension Mother   . Hypothyroidism Mother   . Uterine cancer Maternal  Grandmother     ROS: no fevers or chills, productive cough, hemoptysis, dysphasia, odynophagia, melena, hematochezia, dysuria, hematuria, rash, seizure activity, orthopnea, PND, pedal edema, claudication. Remaining systems are negative.  Physical Exam: Well-developed well-nourished in no acute distress.  Skin is warm and dry.  HEENT is normal.  Neck is supple.  Chest is clear to auscultation with normal expansion. No wheeze; pacemaker left chest Cardiovascular exam is regular rate and rhythm. 3/6 systolic murmur left sternal border Abdominal exam nontender or distended. No masses palpated. Extremities show no edema. neuro grossly intact  ECG- sinus rhythm at a rate of 71. First-degree AV block. Left bundle branch block. personally reviewed  A/P  1 coronary artery disease-continue aspirin and statin.  2 hypertension-blood pressure is elevated. However she missed her medications and she states her systolic is typically 120. She does track this and brought records today. Continue present medications and follow.  3 hyperlipidemia-continue statin. Check lipids and liver. She has not tolerated Crestor, Lipitor or Zocor.  4 abdominal aortic aneurysm-follow-up ultrasound December 2021.  5 pacemaker-followed by electrophysiology.  6 hypertrophic obstructive cardiomyopathy-we will continue with beta blockade. Note previous treadmill did not show hypotension with exercise, previous monitor showed no nonsustained ventricular tachycardia and no family history of sudden death. Patient instructed that her daughter should be screened.   Olga Millers, MD

## 2016-06-22 NOTE — Patient Instructions (Signed)
Medication Instructions:   NO CHANGE  Labwork:  Your physician recommends that you return for lab work WHEN FASTING  Follow-Up:  Your physician wants you to follow-up in: 6 MONTHS WITH DR [name] IN [place] You will receive a reminder letter in the mail two months in advance. If you don't receive a letter, please call our office to schedule the follow-up appointment.   If you need a refill on your cardiac medications before your next appointment, please call your pharmacy.

## 2016-07-13 ENCOUNTER — Ambulatory Visit: Payer: Medicare Other | Admitting: Cardiology

## 2016-08-03 ENCOUNTER — Telehealth: Payer: Self-pay | Admitting: Cardiology

## 2016-08-03 ENCOUNTER — Ambulatory Visit (INDEPENDENT_AMBULATORY_CARE_PROVIDER_SITE_OTHER): Payer: Medicare Other | Admitting: *Deleted

## 2016-08-03 DIAGNOSIS — I442 Atrioventricular block, complete: Secondary | ICD-10-CM | POA: Diagnosis not present

## 2016-08-03 DIAGNOSIS — I2510 Atherosclerotic heart disease of native coronary artery without angina pectoris: Secondary | ICD-10-CM | POA: Diagnosis not present

## 2016-08-03 NOTE — Telephone Encounter (Signed)
Spoke with pt and reminded pt of remote transmission that is due today. Pt verbalized understanding.   

## 2016-08-03 NOTE — Progress Notes (Signed)
Remote pacemaker transmission.   

## 2016-08-04 ENCOUNTER — Encounter: Payer: Self-pay | Admitting: *Deleted

## 2016-08-04 LAB — LIPID PANEL
CHOLESTEROL TOTAL: 193 mg/dL (ref 100–199)
Chol/HDL Ratio: 3.7 ratio (ref 0.0–4.4)
HDL: 52 mg/dL (ref >39)
LDL CALC: 114 mg/dL — AB (ref 0–99)
TRIGLYCERIDES: 135 mg/dL (ref 0–149)
VLDL CHOLESTEROL CAL: 27 mg/dL (ref 5–40)

## 2016-08-04 LAB — HEPATIC FUNCTION PANEL
ALT: 14 IU/L (ref 0–32)
AST: 17 IU/L (ref 0–40)
Albumin: 4.3 g/dL (ref 3.5–4.8)
Alkaline Phosphatase: 68 IU/L (ref 39–117)
Bilirubin Total: 0.4 mg/dL (ref 0.0–1.2)
Bilirubin, Direct: 0.11 mg/dL (ref 0.00–0.40)
Total Protein: 7.2 g/dL (ref 6.0–8.5)

## 2016-08-05 LAB — CUP PACEART REMOTE DEVICE CHECK
Battery Voltage: 3.02 V
Brady Statistic AP VP Percent: 21.64 %
Brady Statistic AS VS Percent: 71.81 %
Implantable Lead Implant Date: 20170801
Implantable Lead Model: 5076
Implantable Pulse Generator Implant Date: 20170801
Lead Channel Impedance Value: 361 Ohm
Lead Channel Impedance Value: 456 Ohm
Lead Channel Impedance Value: 494 Ohm
Lead Channel Pacing Threshold Amplitude: 0.625 V
Lead Channel Pacing Threshold Amplitude: 0.75 V
Lead Channel Pacing Threshold Pulse Width: 0.4 ms
Lead Channel Sensing Intrinsic Amplitude: 1.25 mV
Lead Channel Sensing Intrinsic Amplitude: 1.25 mV
Lead Channel Setting Pacing Amplitude: 2 V
MDC IDC LEAD IMPLANT DT: 20170801
MDC IDC LEAD LOCATION: 753859
MDC IDC LEAD LOCATION: 753860
MDC IDC MSMT BATTERY REMAINING LONGEVITY: 101 mo
MDC IDC MSMT LEADCHNL RA PACING THRESHOLD PULSEWIDTH: 0.4 ms
MDC IDC MSMT LEADCHNL RV IMPEDANCE VALUE: 589 Ohm
MDC IDC MSMT LEADCHNL RV SENSING INTR AMPL: 31.625 mV
MDC IDC MSMT LEADCHNL RV SENSING INTR AMPL: 31.625 mV
MDC IDC SESS DTM: 20180606160131
MDC IDC SET LEADCHNL RV PACING AMPLITUDE: 2.5 V
MDC IDC SET LEADCHNL RV PACING PULSEWIDTH: 0.4 ms
MDC IDC SET LEADCHNL RV SENSING SENSITIVITY: 2.8 mV
MDC IDC STAT BRADY AP VS PERCENT: 6.4 %
MDC IDC STAT BRADY AS VP PERCENT: 0.15 %
MDC IDC STAT BRADY RA PERCENT PACED: 27.5 %
MDC IDC STAT BRADY RV PERCENT PACED: 21.76 %

## 2016-08-09 ENCOUNTER — Ambulatory Visit (INDEPENDENT_AMBULATORY_CARE_PROVIDER_SITE_OTHER): Payer: Medicare Other | Admitting: Family Medicine

## 2016-08-09 ENCOUNTER — Encounter: Payer: Self-pay | Admitting: Cardiology

## 2016-08-09 ENCOUNTER — Encounter: Payer: Self-pay | Admitting: Family Medicine

## 2016-08-09 VITALS — BP 130/80 | HR 78 | Temp 98.0°F | Resp 16 | Ht 64.0 in | Wt 142.0 lb

## 2016-08-09 DIAGNOSIS — K219 Gastro-esophageal reflux disease without esophagitis: Secondary | ICD-10-CM

## 2016-08-09 DIAGNOSIS — F411 Generalized anxiety disorder: Secondary | ICD-10-CM | POA: Diagnosis not present

## 2016-08-09 DIAGNOSIS — L409 Psoriasis, unspecified: Secondary | ICD-10-CM | POA: Diagnosis not present

## 2016-08-09 DIAGNOSIS — I10 Essential (primary) hypertension: Secondary | ICD-10-CM | POA: Diagnosis not present

## 2016-08-09 DIAGNOSIS — E538 Deficiency of other specified B group vitamins: Secondary | ICD-10-CM | POA: Diagnosis not present

## 2016-08-09 DIAGNOSIS — E559 Vitamin D deficiency, unspecified: Secondary | ICD-10-CM | POA: Diagnosis not present

## 2016-08-09 DIAGNOSIS — I2510 Atherosclerotic heart disease of native coronary artery without angina pectoris: Secondary | ICD-10-CM | POA: Diagnosis not present

## 2016-08-09 MED ORDER — RANITIDINE HCL 150 MG PO TABS: 150.0000 mg | ORAL_TABLET | Freq: Two times a day (BID) | ORAL | 0 refills | Status: DC

## 2016-08-09 MED ORDER — ALPRAZOLAM 0.25 MG PO TABS: 0.2500 mg | ORAL_TABLET | Freq: Two times a day (BID) | ORAL | 1 refills | Status: DC | PRN

## 2016-08-09 NOTE — Patient Instructions (Signed)
Food Choices for Gastroesophageal Reflux Disease, Adult When you have gastroesophageal reflux disease (GERD), the foods you eat and your eating habits are very important. Choosing the right foods can help ease your discomfort. What guidelines do I need to follow?  Choose fruits, vegetables, whole grains, and low-fat dairy products.  Choose low-fat meat, fish, and poultry.  Limit fats such as oils, salad dressings, butter, nuts, and avocado.  Keep a food diary. This helps you identify foods that cause symptoms.  Avoid foods that cause symptoms. These may be different for everyone.  Eat small meals often instead of 3 large meals a day.  Eat your meals slowly, in a place where you are relaxed.  Limit fried foods.  Cook foods using methods other than frying.  Avoid drinking alcohol.  Avoid drinking large amounts of liquids with your meals.  Avoid bending over or lying down until 2-3 hours after eating. What foods are not recommended? These are some foods and drinks that may make your symptoms worse: Vegetables  Tomatoes. Tomato juice. Tomato and spaghetti sauce. Chili peppers. Onion and garlic. Horseradish. Fruits  Oranges, grapefruit, and lemon (fruit and juice). Meats  High-fat meats, fish, and poultry. This includes hot dogs, ribs, ham, sausage, salami, and bacon. Dairy  Whole milk and chocolate milk. Sour cream. Cream. Butter. Ice cream. Cream cheese. Drinks  Coffee and tea. Bubbly (carbonated) drinks or energy drinks. Condiments  Hot sauce. Barbecue sauce. Sweets/Desserts  Chocolate and cocoa. Donuts. Peppermint and spearmint. Fats and Oils  High-fat foods. This includes French fries and potato chips. Other  Vinegar. Strong spices. This includes black pepper, white pepper, red pepper, cayenne, curry powder, cloves, ginger, and chili powder. The items listed above may not be a complete list of foods and drinks to avoid. Contact your dietitian for more information.    This information is not intended to replace advice given to you by your health care provider. Make sure you discuss any questions you have with your health care provider. Document Released: 08/16/2011 Document Revised: 07/23/2015 Document Reviewed: 12/19/2012 Elsevier Interactive Patient Education  2017 Elsevier Inc.  

## 2016-08-09 NOTE — Progress Notes (Signed)
Patient ID: Nicole Watkins, female   DOB: October 12, 1940, 76 y.o.   MRN: [id number]     Subjective:  I acted as a Neurosurgeon for Dr. [name].  Apolonio Schneiders, CMA   Patient ID: Nicole Watkins, female    DOB: 1940/06/08, 76 y.o.   MRN: [id number]  Chief Complaint  Patient presents with  . Hyperlipidemia    crenshaw did lipid panel    HPI  Patient is in today for follow up cholesterol, anxiety, and heartburn.  She saw Dr. [name] a few weeks ago and he done a lipid panel.  She would like a refill on Alprazolam.  She take most of the time 1/2 tablet at night.  She is doing well.  She was given Protonix in the hospital for heartburn and she feels that it is not working. Pt c/o ringing in the ears----she has had that for years-----she does not want to be referred right now.   She has a hx of psoriasis and would like a referral to a derm here in town.  She has not seen one since moving here.   Patient Care Team: [name], Grayling Congress, DO as PCP - General (Family Medicine) [name] Madolyn Frieze, MD as Consulting Physician (Cardiology)   Past Medical History:  Diagnosis Date  . Anxiety   . Arthritis   . CAD (coronary artery disease)   . CHB (complete heart block) (HCC)    a. s/p PPM on 09/29/15  . GERD (gastroesophageal reflux disease)   . Hyperlipidemia   . Hypertension   . LBBB (left bundle branch block)   . Status post placement of cardiac pacemaker    a. 09/2015: Medtronic Advisa MRI model [id number] (serial number [id number]) pacemaker  . Thyroid-related proptosis     Past Surgical History:  Procedure Laterality Date  . CARDIAC SURGERY     stent placed 03/04/13  . EP IMPLANTABLE DEVICE N/A 06/17/2015   Procedure: Loop Recorder Insertion;  Surgeon: [name], MD;  Location: [facility] INVASIVE CV LAB;  Service: Cardiovascular;  Laterality: N/A;  . EP IMPLANTABLE DEVICE N/A 09/29/2015   Procedure: Pacemaker Implant;  Surgeon: [name], MD;  Location: [facility] INVASIVE CV LAB;  Service: Cardiovascular;   Laterality: N/A;  . No family history      Family History  Problem Relation Age of Onset  . Arthritis Mother   . Hyperlipidemia Mother   . Hypertension Mother   . Hypothyroidism Mother   . Uterine cancer Maternal Grandmother     Social History   Social History  . Marital status: Widowed    Spouse name: N/A  . Number of children: 1  . Years of education: N/A   Occupational History  . Not on file.   Social History Main Topics  . Smoking status: Former Games developer  . Smokeless tobacco: Never Used  . Alcohol use No  . Drug use: No  . Sexual activity: Not on file   Other Topics Concern  . Not on file   Social History Narrative  . No narrative on file    Outpatient Medications Prior to Visit  Medication Sig Dispense Refill  . aspirin 81 MG tablet Take 81 mg by mouth every evening. Reported on 08/10/2015    . Cholecalciferol 4000 UNITS CAPS Take 4,000 Units by mouth every evening. Reported on 08/10/2015    . cyanocobalamin 2000 MCG tablet Take 500 mcg by mouth every evening. Reported on 08/10/2015    . docusate sodium (COLACE) 100 MG capsule Take 100  mg by mouth 2 (two) times daily. Reported on 08/10/2015    . lisinopril (PRINIVIL,ZESTRIL) 20 MG tablet TAKE ONE TABLET TWO TIMES A DAY 180 tablet 1  . nitroGLYCERIN (NITROSTAT) 0.4 MG SL tablet Place 1 tablet (0.4 mg total) under the tongue every 5 (five) minutes as needed for chest pain. Reported on 08/10/2015 30 tablet 0  . ALPRAZolam (XANAX) 0.25 MG tablet Take 1 tablet (0.25 mg total) by mouth 2 (two) times daily as needed for anxiety. 60 tablet 1  . amLODipine (NORVASC) 5 MG tablet Take 0.5 tablets (2.5 mg total) by mouth daily. 180 tablet 3  . metoprolol (LOPRESSOR) 50 MG tablet Take 1 tablet (50 mg total) by mouth 2 (two) times daily. 180 tablet 3  . pravastatin (PRAVACHOL) 40 MG tablet Take 1 tablet (40 mg total) by mouth every evening. 90 tablet 3   No facility-administered medications prior to visit.     Allergies    Allergen Reactions  . Lipitor [Atorvastatin] Other (See Comments)    HA, depression, vomiting, headache  . Penicillins Anaphylaxis    Has patient had a PCN reaction causing immediate rash, facial/tongue/throat swelling, SOB or lightheadedness with hypotension: Yes Has patient had a PCN reaction causing severe rash involving mucus membranes or skin necrosis: No Has patient had a PCN reaction that required hospitalization No Has patient had a PCN reaction occurring within the last 10 years: No If all of the above answers are "NO", then may proceed with Cephalosporin use.   . Ambien [Zolpidem Tartrate] Other (See Comments)    Per patient's daughter-patient becomes delirious  . Crestor [Rosuvastatin Calcium] Nausea And Vomiting    Depression, heading    Review of Systems  Constitutional: Negative for fever and malaise/fatigue.  HENT: Negative for congestion.   Eyes: Negative for blurred vision.  Respiratory: Negative for cough and shortness of breath.   Cardiovascular: Negative for chest pain, palpitations and leg swelling.  Gastrointestinal: Negative for vomiting.  Musculoskeletal: Negative for back pain.  Skin: Negative for rash.  Neurological: Negative for loss of consciousness and headaches.       Objective:    Physical Exam  Constitutional: She is oriented to person, place, and time. She appears well-developed and well-nourished. No distress.  HENT:  Head: Normocephalic and atraumatic.  Eyes: Conjunctivae are normal.  Neck: Normal range of motion. No thyromegaly present.  Cardiovascular: Normal rate and regular rhythm.   Pulmonary/Chest: Effort normal and breath sounds normal. She has no wheezes.  Abdominal: Soft. Bowel sounds are normal. There is no tenderness.  Musculoskeletal: Normal range of motion. She exhibits edema. She exhibits no deformity.       Right ankle: She exhibits swelling.       Left ankle: She exhibits swelling.  Neurological: She is alert and oriented  to person, place, and time.  Skin: Skin is warm and dry. She is not diaphoretic. There is erythema.  White plaques with some dry blood on lat side of both feet c/w psoriasis   Psychiatric: She has a normal mood and affect.    BP 130/80 (BP Location: Left Arm, Cuff Size: Normal)   Pulse 78   Temp 98 F (36.7 C) (Oral)   Resp 16   Ht 5\' 4"  (1.626 m)   Wt 142 lb (64.4 kg)   SpO2 98%   BMI 24.37 kg/m  Wt Readings from Last 3 Encounters:  08/09/16 142 lb (64.4 kg)  06/22/16 141 lb 12.8 oz (64.3 kg)  02/09/16 135  lb 12.8 oz (61.6 kg)   BP Readings from Last 3 Encounters:  08/09/16 130/80  06/22/16 (!) 202/93  02/09/16 (!) 142/80      There is no immunization history on file for this patient.  Health Maintenance  Topic Date Due  . TETANUS/TDAP  05/16/1959  . INFLUENZA VACCINE  02/08/2017 (Originally 09/28/2016)  . PNA vac Low Risk Adult (1 of 2 - PCV13) 05/15/2025 (Originally 05/15/2005)  . DEXA SCAN  Completed    Lab Results  Component Value Date   WBC 8.6 10/02/2015   HGB 13.0 10/02/2015   HCT 40.1 10/02/2015   PLT 214 10/02/2015   GLUCOSE 58 (L) 02/09/2016   CHOL 193 08/03/2016   TRIG 135 08/03/2016   HDL 52 08/03/2016   LDLCALC 114 (H) 08/03/2016   ALT 14 08/03/2016   AST 17 08/03/2016   NA 140 02/09/2016   K 4.5 02/09/2016   CL 104 02/09/2016   CREATININE 0.90 02/09/2016   BUN 19 02/09/2016   CO2 32 02/09/2016   TSH 2.843 09/28/2015   INR 1.09 09/29/2015    Lab Results  Component Value Date   TSH 2.843 09/28/2015   Lab Results  Component Value Date   WBC 8.6 10/02/2015   HGB 13.0 10/02/2015   HCT 40.1 10/02/2015   MCV 89.1 10/02/2015   PLT 214 10/02/2015   Lab Results  Component Value Date   NA 140 02/09/2016   K 4.5 02/09/2016   CO2 32 02/09/2016   GLUCOSE 58 (L) 02/09/2016   BUN 19 02/09/2016   CREATININE 0.90 02/09/2016   BILITOT 0.4 08/03/2016   ALKPHOS 68 08/03/2016   AST 17 08/03/2016   ALT 14 08/03/2016   PROT 7.2 08/03/2016    ALBUMIN 4.3 08/03/2016   CALCIUM 9.7 02/09/2016   ANIONGAP 7 10/02/2015   GFR 64.75 02/09/2016   Lab Results  Component Value Date   CHOL 193 08/03/2016   Lab Results  Component Value Date   HDL 52 08/03/2016   Lab Results  Component Value Date   LDLCALC 114 (H) 08/03/2016   Lab Results  Component Value Date   TRIG 135 08/03/2016   Lab Results  Component Value Date   CHOLHDL 3.7 08/03/2016   No results found for: HGBA1C       Assessment & Plan:   Problem List Items Addressed This Visit      Unprioritized   Psoriasis   Relevant Orders   Ambulatory referral to Dermatology   Vitamin D deficiency   Relevant Orders   Vitamin D (25 hydroxy)   GERD (gastroesophageal reflux disease) - Primary   Relevant Medications   ranitidine (ZANTAC) 150 MG tablet   Other Relevant Orders   Comprehensive metabolic panel   Ambulatory referral to Gastroenterology   Generalized anxiety disorder   Relevant Medications   ALPRAZolam (XANAX) 0.25 MG tablet   Other Relevant Orders   Comprehensive metabolic panel    Other Visit Diagnoses    B12 deficiency       Relevant Orders   Vitamin B12      I have discontinued Ms. Mandel's pantoprazole. I am also having her start on ranitidine. Additionally, I am having her maintain her Cholecalciferol, cyanocobalamin, aspirin, docusate sodium, nitroGLYCERIN, amLODipine, metoprolol tartrate, pravastatin, lisinopril, metoprolol tartrate, and ALPRAZolam.  Meds ordered this encounter  Medications  . DISCONTD: pantoprazole (PROTONIX) 40 MG tablet    Sig: Take 1 tablet by mouth daily.  . metoprolol tartrate (LOPRESSOR) 50 MG tablet  Sig: Take 1/2 tab am and 1 tab at night  . ALPRAZolam (XANAX) 0.25 MG tablet    Sig: Take 1 tablet (0.25 mg total) by mouth 2 (two) times daily as needed for anxiety.    Dispense:  60 tablet    Refill:  1  . ranitidine (ZANTAC) 150 MG tablet    Sig: Take 1 tablet (150 mg total) by mouth 2 (two) times daily.     Dispense:  60 tablet    Refill:  0    CMA served as scribe during this visit. History, Physical and Plan performed by medical provider. Documentation and orders reviewed and attested to.  Donato Schultz, DO

## 2016-08-10 LAB — COMPREHENSIVE METABOLIC PANEL
ALT: 17 U/L (ref 0–35)
AST: 24 U/L (ref 0–37)
Albumin: 4.1 g/dL (ref 3.5–5.2)
Alkaline Phosphatase: 58 U/L (ref 39–117)
BILIRUBIN TOTAL: 0.5 mg/dL (ref 0.2–1.2)
BUN: 22 mg/dL (ref 6–23)
CALCIUM: 9.6 mg/dL (ref 8.4–10.5)
CO2: 30 meq/L (ref 19–32)
Chloride: 101 mEq/L (ref 96–112)
Creatinine, Ser: 0.91 mg/dL (ref 0.40–1.20)
GFR: 63.84 mL/min (ref >60.00)
GLUCOSE: 94 mg/dL (ref 70–99)
Potassium: 4.5 mEq/L (ref 3.5–5.1)
Sodium: 136 mEq/L (ref 135–145)
Total Protein: 7 g/dL (ref 6.0–8.3)

## 2016-08-10 LAB — VITAMIN D 25 HYDROXY (VIT D DEFICIENCY, FRACTURES): VITD: 52.78 ng/mL (ref 30.00–100.00)

## 2016-08-10 LAB — VITAMIN B12: VITAMIN B 12: 1383 pg/mL — AB (ref 211–911)

## 2016-08-10 NOTE — Assessment & Plan Note (Signed)
D/c protonix Start zantac bid  Refer gi

## 2016-08-11 NOTE — Addendum Note (Signed)
Addended byConrad Herscher: Matai Carpenito D on: 08/11/2016 09:50 AM   Modules accepted: Orders

## 2016-09-07 ENCOUNTER — Telehealth: Payer: Self-pay | Admitting: Cardiology

## 2016-09-07 NOTE — Telephone Encounter (Signed)
New message      Pt states that she has a couple of questions to ask the nurse.  She would not give me any more info.

## 2016-09-07 NOTE — Telephone Encounter (Signed)
Spoke with pt, she has noticed swelling in her feet and ankles that gradually gets worse during the day. She denies SOB or orthopnea. Her bp has been running 108/70, 113/73, 129/78 and 105/65 prior to taking her medications. Discussed with dr Jens Somcrenshaw, patient instructed to stop amlodipine. She will track her bp at home and call if consistently 130/85. Pt agreed with this plan.

## 2016-09-14 ENCOUNTER — Telehealth: Payer: Self-pay | Admitting: Cardiology

## 2016-09-14 MED ORDER — AMLODIPINE BESYLATE 2.5 MG PO TABS: 2.5000 mg | ORAL_TABLET | Freq: Every day | ORAL | 3 refills | Status: DC

## 2016-09-14 NOTE — Telephone Encounter (Signed)
Please call,cocnerning her blood readings.

## 2016-09-14 NOTE — Telephone Encounter (Signed)
Spoke with pt, she was off the amlodipine for 4 days and her bp started climbing. Her swelling did not change off the amlodipine. She restarted the amlodipine at 2.5 mg daily, her previous dose and her bp has returned to normal. She will continue to watch the swelling and let us know if it gets too hard to manage with elevation, exercise and support hose.

## 2016-09-15 DIAGNOSIS — L821 Other seborrheic keratosis: Secondary | ICD-10-CM | POA: Diagnosis not present

## 2016-09-15 DIAGNOSIS — L403 Pustulosis palmaris et plantaris: Secondary | ICD-10-CM | POA: Diagnosis not present

## 2016-09-15 DIAGNOSIS — L700 Acne vulgaris: Secondary | ICD-10-CM | POA: Diagnosis not present

## 2016-09-20 DIAGNOSIS — H25013 Cortical age-related cataract, bilateral: Secondary | ICD-10-CM | POA: Diagnosis not present

## 2016-09-20 DIAGNOSIS — H52223 Regular astigmatism, bilateral: Secondary | ICD-10-CM | POA: Diagnosis not present

## 2016-09-20 DIAGNOSIS — H524 Presbyopia: Secondary | ICD-10-CM | POA: Diagnosis not present

## 2016-09-20 DIAGNOSIS — H5203 Hypermetropia, bilateral: Secondary | ICD-10-CM | POA: Diagnosis not present

## 2016-10-26 ENCOUNTER — Other Ambulatory Visit: Payer: Self-pay | Admitting: *Deleted

## 2016-10-26 MED ORDER — AMLODIPINE BESYLATE 2.5 MG PO TABS: 2.5000 mg | ORAL_TABLET | Freq: Every day | ORAL | 3 refills | Status: DC

## 2016-11-02 ENCOUNTER — Telehealth: Payer: Self-pay | Admitting: Cardiology

## 2016-11-02 ENCOUNTER — Ambulatory Visit (INDEPENDENT_AMBULATORY_CARE_PROVIDER_SITE_OTHER): Payer: Medicare Other | Admitting: *Deleted

## 2016-11-02 DIAGNOSIS — I442 Atrioventricular block, complete: Secondary | ICD-10-CM | POA: Diagnosis not present

## 2016-11-02 NOTE — Telephone Encounter (Signed)
Spoke with pt and reminded pt of remote transmission that is due today. Pt verbalized understanding.   

## 2016-11-03 NOTE — Progress Notes (Signed)
Remote pacemaker transmission.   

## 2016-11-07 ENCOUNTER — Encounter: Payer: Self-pay | Admitting: Internal Medicine

## 2016-11-07 ENCOUNTER — Ambulatory Visit (INDEPENDENT_AMBULATORY_CARE_PROVIDER_SITE_OTHER): Payer: Medicare Other | Admitting: Internal Medicine

## 2016-11-07 VITALS — BP 196/94 | HR 70 | Ht 64.0 in | Wt 140.4 lb

## 2016-11-07 DIAGNOSIS — I10 Essential (primary) hypertension: Secondary | ICD-10-CM

## 2016-11-07 DIAGNOSIS — I2510 Atherosclerotic heart disease of native coronary artery without angina pectoris: Secondary | ICD-10-CM | POA: Diagnosis not present

## 2016-11-07 DIAGNOSIS — I442 Atrioventricular block, complete: Secondary | ICD-10-CM | POA: Diagnosis not present

## 2016-11-07 DIAGNOSIS — I421 Obstructive hypertrophic cardiomyopathy: Secondary | ICD-10-CM | POA: Diagnosis not present

## 2016-11-07 LAB — CUP PACEART INCLINIC DEVICE CHECK
Battery Remaining Longevity: 101 mo
Brady Statistic AP VS Percent: 6.94 %
Brady Statistic AS VP Percent: 0.23 %
Brady Statistic RA Percent Paced: 26 %
Brady Statistic RV Percent Paced: 19.66 %
Date Time Interrogation Session: 20180910135123
Implantable Lead Implant Date: 20170801
Implantable Lead Implant Date: 20170801
Implantable Lead Location: 753860
Implantable Lead Model: 5076
Lead Channel Impedance Value: 475 Ohm
Lead Channel Impedance Value: 589 Ohm
Lead Channel Pacing Threshold Amplitude: 0.5 V
Lead Channel Pacing Threshold Pulse Width: 0.4 ms
Lead Channel Sensing Intrinsic Amplitude: 1.375 mV
Lead Channel Sensing Intrinsic Amplitude: 31.625 mV
Lead Channel Sensing Intrinsic Amplitude: 31.625 mV
Lead Channel Setting Pacing Amplitude: 2 V
Lead Channel Setting Sensing Sensitivity: 2.8 mV
MDC IDC LEAD LOCATION: 753859
MDC IDC MSMT BATTERY VOLTAGE: 3.02 V
MDC IDC MSMT LEADCHNL RA IMPEDANCE VALUE: 380 Ohm
MDC IDC MSMT LEADCHNL RA SENSING INTR AMPL: 2.5 mV
MDC IDC MSMT LEADCHNL RV IMPEDANCE VALUE: 494 Ohm
MDC IDC MSMT LEADCHNL RV PACING THRESHOLD AMPLITUDE: 0.75 V
MDC IDC MSMT LEADCHNL RV PACING THRESHOLD PULSEWIDTH: 0.4 ms
MDC IDC PG IMPLANT DT: 20170801
MDC IDC SET LEADCHNL RV PACING AMPLITUDE: 2.5 V
MDC IDC SET LEADCHNL RV PACING PULSEWIDTH: 0.4 ms
MDC IDC STAT BRADY AP VP PERCENT: 19.5 %
MDC IDC STAT BRADY AS VS PERCENT: 73.33 %

## 2016-11-07 NOTE — Progress Notes (Signed)
PCP: Donato SchultzLowne Watkins, [name], DO Primary Cardiologist:  Dr Jens Somrenshaw Primary EP:  Dr Johney FrameAllred    Nicole BorsJuliette Watkins is a 76 y.o. female who presents today for routine electrophysiology followup.  Since last being seen in our clinic, the patient reports doing very well.  Today, she denies symptoms of palpitations, chest pain, shortness of breath,  lower extremity edema, dizziness, presyncope, or syncope.  The patient is otherwise without complaint today.   Past Medical History:  Diagnosis Date  . Anxiety   . Arthritis   . CAD (coronary artery disease)   . CHB (complete heart block) (HCC)    a. s/p PPM on 09/29/15  . GERD (gastroesophageal reflux disease)   . Hyperlipidemia   . Hypertension   . LBBB (left bundle branch block)   . Status post placement of cardiac pacemaker    a. 09/2015: Medtronic Advisa MRI model [id number] (serial number [id number]) pacemaker  . Thyroid-related proptosis    Past Surgical History:  Procedure Laterality Date  . CARDIAC SURGERY     stent placed 03/04/13  . EP IMPLANTABLE DEVICE N/A 06/17/2015   Procedure: Loop Recorder Insertion;  Surgeon: Duke SalviaSteven C Klein, MD;  [facility];  Service: Cardiovascular;  Laterality: N/A;  . EP IMPLANTABLE DEVICE N/A 09/29/2015   Procedure: Pacemaker Implant;  Surgeon: Hillis RangeJames Ronya Gilcrest, MD;  Location: [facility] INVASIVE CV LAB;  Service: Cardiovascular;  Laterality: N/A;  . No family history      ROS- all systems are reviewed and negative except as per HPI above  Current Outpatient Prescriptions  Medication Sig Dispense Refill  . ALPRAZolam (XANAX) 0.25 MG tablet Take 1 tablet (0.25 mg total) by mouth 2 (two) times daily as needed for anxiety. 60 tablet 1  . amLODipine (NORVASC) 2.5 MG tablet Take 1 tablet (2.5 mg total) by mouth daily. 180 tablet 3  . aspirin 81 MG tablet Take 81 mg by mouth every evening. Reported on 08/10/2015    . Cholecalciferol 4000 UNITS CAPS Take 4,000 Units by mouth every evening. Reported on 08/10/2015      . docusate sodium (COLACE) 100 MG capsule Take 100 mg by mouth 2 (two) times daily. Reported on 08/10/2015    . lisinopril (PRINIVIL,ZESTRIL) 20 MG tablet TAKE ONE TABLET TWO TIMES A DAY 180 tablet 1  . metoprolol tartrate (LOPRESSOR) 50 MG tablet Take 1/2 tab by mouth in the am and 1 tab at night    . nitroGLYCERIN (NITROSTAT) 0.4 MG SL tablet Place 1 tablet (0.4 mg total) under the tongue every 5 (five) minutes as needed for chest pain. Reported on 08/10/2015 30 tablet 0  . pravastatin (PRAVACHOL) 40 MG tablet Take 1 tablet (40 mg total) by mouth every evening. 90 tablet 3   No current facility-administered medications for this visit.     Physical Exam: Vitals:   11/07/16 1022  BP: (!) 196/94  Pulse: 70  SpO2: 96%  Weight: 140 lb 6.4 oz (63.7 kg)  Height: 5\' 4"  (1.626 m)    GEN- The patient is well appearing, alert and oriented x 3 today.   Head- normocephalic, atraumatic Eyes-  Sclera clear, conjunctiva pink Ears- hearing intact Oropharynx- clear Lungs- Clear to ausculation bilaterally, normal work of breathing Chest- pacemaker pocket is well healed Heart- Regular rate and rhythm, no murmurs, rubs or gallops, PMI not laterally displaced GI- soft, NT, ND, + BS Extremities- no clubbing, cyanosis, or edema  Pacemaker interrogation- reviewed in detail today,  See PACEART report  Assessment and Plan:  1. Symptomatic transient complete heart block Normal pacemaker function See Pace Art report No changes today  2. HTN Elevated today She brings her journal which reveals normal BPs at home She reports elevated BP is related to anxiety and is a chronic issue.  She has seen Dr Rhetta Mura in the past.  3. HCM Stable No change required today  [facility] Follow-up with Dr [name] as scheduled Return to see EP NP in a year  [name] MD, [facility] 11/07/2016 10:52 AM

## 2016-11-07 NOTE — Patient Instructions (Signed)
Medication Instructions:  Your physician recommends that you continue on your current medications as directed. Please refer to the Current Medication list given to you today.   Labwork: None ordered   Testing/Procedures: None ordered   Follow-Up: Remote monitoring is used to monitor your Pacemaker  from home. This monitoring reduces the number of office visits required to check your device to one time per year. It allows us to keep an eye on the functioning of your device to ensure it is working properly. You are scheduled for a device check from home on 02/01/17. You may send your transmission at any time that day. If you have a wireless device, the transmission will be sent automatically. After your physician reviews your transmission, you will receive a postcard with your next transmission date.    Your physician wants you to follow-up in: 12 months with Gypsy BalsamAmber Seiler, NP You will receive a reminder letter in the mail two months in advance. If you don't receive a letter, please call our office to schedule the follow-up appointment.

## 2016-11-08 ENCOUNTER — Encounter: Payer: Self-pay | Admitting: Cardiology

## 2016-11-15 LAB — CUP PACEART REMOTE DEVICE CHECK
Battery Remaining Longevity: 101 mo
Battery Voltage: 3.02 V
Brady Statistic RV Percent Paced: 17.25 %
Date Time Interrogation Session: 20180905160133
Implantable Lead Implant Date: 20170801
Implantable Lead Implant Date: 20170801
Implantable Lead Location: 753859
Implantable Lead Model: 5076
Implantable Pulse Generator Implant Date: 20170801
Lead Channel Impedance Value: 570 Ohm
Lead Channel Pacing Threshold Amplitude: 0.5 V
Lead Channel Pacing Threshold Pulse Width: 0.4 ms
Lead Channel Sensing Intrinsic Amplitude: 1.375 mV
Lead Channel Setting Pacing Amplitude: 2 V
Lead Channel Setting Pacing Amplitude: 2.5 V
Lead Channel Setting Sensing Sensitivity: 2.8 mV
MDC IDC LEAD LOCATION: 753860
MDC IDC MSMT LEADCHNL RA IMPEDANCE VALUE: 361 Ohm
MDC IDC MSMT LEADCHNL RA IMPEDANCE VALUE: 475 Ohm
MDC IDC MSMT LEADCHNL RA SENSING INTR AMPL: 1.375 mV
MDC IDC MSMT LEADCHNL RV IMPEDANCE VALUE: 475 Ohm
MDC IDC MSMT LEADCHNL RV PACING THRESHOLD AMPLITUDE: 0.625 V
MDC IDC MSMT LEADCHNL RV PACING THRESHOLD PULSEWIDTH: 0.4 ms
MDC IDC MSMT LEADCHNL RV SENSING INTR AMPL: 31.625 mV
MDC IDC MSMT LEADCHNL RV SENSING INTR AMPL: 31.625 mV
MDC IDC SET LEADCHNL RV PACING PULSEWIDTH: 0.4 ms
MDC IDC STAT BRADY AP VP PERCENT: 17.23 %
MDC IDC STAT BRADY AP VS PERCENT: 11.85 %
MDC IDC STAT BRADY AS VP PERCENT: 0.06 %
MDC IDC STAT BRADY AS VS PERCENT: 70.86 %
MDC IDC STAT BRADY RA PERCENT PACED: 28.38 %

## 2016-11-17 ENCOUNTER — Other Ambulatory Visit: Payer: Self-pay | Admitting: Cardiology

## 2016-11-17 DIAGNOSIS — I421 Obstructive hypertrophic cardiomyopathy: Secondary | ICD-10-CM

## 2016-12-04 ENCOUNTER — Other Ambulatory Visit: Payer: Self-pay | Admitting: Cardiology

## 2016-12-05 NOTE — Telephone Encounter (Signed)
Rx request sent to pharmacy.  

## 2017-02-01 ENCOUNTER — Ambulatory Visit (INDEPENDENT_AMBULATORY_CARE_PROVIDER_SITE_OTHER): Payer: Medicare Other | Admitting: *Deleted

## 2017-02-01 DIAGNOSIS — I442 Atrioventricular block, complete: Secondary | ICD-10-CM

## 2017-02-02 ENCOUNTER — Other Ambulatory Visit: Payer: Self-pay

## 2017-02-02 ENCOUNTER — Encounter: Payer: Self-pay | Admitting: Cardiology

## 2017-02-02 NOTE — Progress Notes (Signed)
Remote pacemaker transmission.   

## 2017-02-08 LAB — CUP PACEART REMOTE DEVICE CHECK
Battery Remaining Longevity: 100 mo
Battery Voltage: 3.02 V
Brady Statistic AP VS Percent: 7.87 %
Brady Statistic AS VP Percent: 0.21 %
Brady Statistic RA Percent Paced: 29.49 %
Brady Statistic RV Percent Paced: 22.08 %
Implantable Lead Implant Date: 20170801
Implantable Lead Implant Date: 20170801
Implantable Lead Model: 5076
Implantable Lead Model: 5076
Implantable Pulse Generator Implant Date: 20170801
Lead Channel Impedance Value: 361 Ohm
Lead Channel Impedance Value: 475 Ohm
Lead Channel Impedance Value: 589 Ohm
Lead Channel Pacing Threshold Amplitude: 0.625 V
Lead Channel Sensing Intrinsic Amplitude: 2.375 mV
Lead Channel Sensing Intrinsic Amplitude: 31.625 mV
Lead Channel Setting Pacing Amplitude: 2.5 V
MDC IDC LEAD LOCATION: 753859
MDC IDC LEAD LOCATION: 753860
MDC IDC MSMT LEADCHNL RA PACING THRESHOLD PULSEWIDTH: 0.4 ms
MDC IDC MSMT LEADCHNL RA SENSING INTR AMPL: 2.375 mV
MDC IDC MSMT LEADCHNL RV IMPEDANCE VALUE: 494 Ohm
MDC IDC MSMT LEADCHNL RV PACING THRESHOLD AMPLITUDE: 0.75 V
MDC IDC MSMT LEADCHNL RV PACING THRESHOLD PULSEWIDTH: 0.4 ms
MDC IDC MSMT LEADCHNL RV SENSING INTR AMPL: 31.625 mV
MDC IDC SESS DTM: 20181205193931
MDC IDC SET LEADCHNL RA PACING AMPLITUDE: 2 V
MDC IDC SET LEADCHNL RV PACING PULSEWIDTH: 0.4 ms
MDC IDC SET LEADCHNL RV SENSING SENSITIVITY: 2.8 mV
MDC IDC STAT BRADY AP VP PERCENT: 22.07 %
MDC IDC STAT BRADY AS VS PERCENT: 69.85 %

## 2017-02-09 ENCOUNTER — Ambulatory Visit (INDEPENDENT_AMBULATORY_CARE_PROVIDER_SITE_OTHER): Payer: Medicare Other | Admitting: Family Medicine

## 2017-02-09 ENCOUNTER — Encounter: Payer: Self-pay | Admitting: Family Medicine

## 2017-02-09 VITALS — BP 144/81 | HR 68 | Temp 98.3°F | Resp 16 | Ht 64.0 in | Wt 143.4 lb

## 2017-02-09 DIAGNOSIS — I10 Essential (primary) hypertension: Secondary | ICD-10-CM

## 2017-02-09 DIAGNOSIS — F411 Generalized anxiety disorder: Secondary | ICD-10-CM

## 2017-02-09 DIAGNOSIS — I2510 Atherosclerotic heart disease of native coronary artery without angina pectoris: Secondary | ICD-10-CM | POA: Diagnosis not present

## 2017-02-09 DIAGNOSIS — E785 Hyperlipidemia, unspecified: Secondary | ICD-10-CM | POA: Diagnosis not present

## 2017-02-09 LAB — LIPID PANEL
CHOLESTEROL: 211 mg/dL — AB (ref 0–200)
HDL: 51 mg/dL (ref >39.00)
LDL CALC: 136 mg/dL — AB (ref 0–99)
NonHDL: 160.27
TRIGLYCERIDES: 121 mg/dL (ref 0.0–149.0)
Total CHOL/HDL Ratio: 4
VLDL: 24.2 mg/dL (ref 0.0–40.0)

## 2017-02-09 LAB — COMPREHENSIVE METABOLIC PANEL
ALBUMIN: 4.2 g/dL (ref 3.5–5.2)
ALK PHOS: 50 U/L (ref 39–117)
ALT: 10 U/L (ref 0–35)
AST: 18 U/L (ref 0–37)
BILIRUBIN TOTAL: 0.5 mg/dL (ref 0.2–1.2)
BUN: 24 mg/dL — AB (ref 6–23)
CALCIUM: 9.7 mg/dL (ref 8.4–10.5)
CO2: 32 mEq/L (ref 19–32)
CREATININE: 0.96 mg/dL (ref 0.40–1.20)
Chloride: 103 mEq/L (ref 96–112)
GFR: 59.94 mL/min — ABNORMAL LOW (ref >60.00)
Glucose, Bld: 94 mg/dL (ref 70–99)
Potassium: 4.4 mEq/L (ref 3.5–5.1)
SODIUM: 140 meq/L (ref 135–145)
TOTAL PROTEIN: 7.2 g/dL (ref 6.0–8.3)

## 2017-02-09 MED ORDER — ALPRAZOLAM 0.25 MG PO TABS: 0.2500 mg | ORAL_TABLET | Freq: Two times a day (BID) | ORAL | 1 refills | Status: DC | PRN

## 2017-02-09 NOTE — Assessment & Plan Note (Signed)
Doing well with occasional xanax

## 2017-02-09 NOTE — Assessment & Plan Note (Signed)
Well controlled, no changes to meds. Encouraged heart healthy diet such as the DASH diet and exercise as tolerated.  °

## 2017-02-09 NOTE — Progress Notes (Signed)
Patient ID: Nicole Watkins, female   DOB: 1940/10/11, 76 y.o.   MRN: [id number]    Subjective:  I acted as a Neurosurgeon for Dr. Zola Watkins.  Nicole Watkins, CMA   Patient ID: Nicole Watkins, female    DOB: [birth year], 76 y.o.   MRN: [id number]  Chief Complaint  Patient presents with  . Hyperlipidemia  . Anxiety    HPI  Patient is in today for f/u htn  No complaints.   Patient Care Team: Nicole Watkins, Nicole Congress, DO as PCP - General (Family Medicine) Nicole Som Madolyn Frieze, MD as Consulting Physician (Cardiology)   Past Medical History:  Diagnosis Date  . Anxiety   . Arthritis   . CAD (coronary artery disease)   . CHB (complete heart block) (HCC)    a. s/p PPM on 09/29/15  . GERD (gastroesophageal reflux disease)   . Hyperlipidemia   . Hypertension   . LBBB (left bundle branch block)   . Status post placement of cardiac pacemaker    a. 09/2015: Medtronic Advisa MRI model [id number] (serial number [id number]) pacemaker  . Thyroid-related proptosis     Past Surgical History:  Procedure Laterality Date  . CARDIAC SURGERY     stent placed 03/04/13  . EP IMPLANTABLE DEVICE N/A 06/17/2015   Procedure: Loop Recorder Insertion;  Surgeon: Nicole Salvia, MD;  Location: [facility] INVASIVE CV LAB;  Service: Cardiovascular;  Laterality: N/A;  . EP IMPLANTABLE DEVICE N/A 09/29/2015   Procedure: Pacemaker Implant;  Surgeon: [name], MD;  Location: [facility] INVASIVE CV LAB;  Service: Cardiovascular;  Laterality: N/A;  . No family history      Family History  Problem Relation Age of Onset  . Arthritis Mother   . Hyperlipidemia Mother   . Hypertension Mother   . Hypothyroidism Mother   . Uterine cancer Maternal Grandmother     Social History   Socioeconomic History  . Marital status: Widowed    Spouse name: Not on file  . Number of children: 1  . Years of education: Not on file  . Highest education level: Not on file  Social Needs  . Financial resource strain: Not on file  . Food insecurity - worry: Not on  file  . Food insecurity - inability: Not on file  . Transportation needs - medical: Not on file  . Transportation needs - non-medical: Not on file  Occupational History  . Not on file  Tobacco Use  . Smoking status: Former Games developer  . Smokeless tobacco: Never Used  Substance and Sexual Activity  . Alcohol use: No    Alcohol/week: 0.0 oz  . Drug use: No  . Sexual activity: Not on file  Other Topics Concern  . Not on file  Social History Narrative  . Not on file    Outpatient Medications Prior to Visit  Medication Sig Dispense Refill  . amLODipine (NORVASC) 2.5 MG tablet Take 1 tablet (2.5 mg total) by mouth daily. 180 tablet 3  . aspirin 81 MG tablet Take 81 mg by mouth every evening. Reported on 08/10/2015    . Cholecalciferol 4000 UNITS CAPS Take 4,000 Units by mouth every evening. Reported on 08/10/2015    . docusate sodium (COLACE) 100 MG capsule Take 100 mg by mouth 2 (two) times daily. Reported on 08/10/2015    . lisinopril (PRINIVIL,ZESTRIL) 20 MG tablet TAKE ONE TABLET TWO TIMES A DAY 180 tablet 0  . metoprolol tartrate (LOPRESSOR) 50 MG tablet Take 1/2 tab by mouth in the am and  1 tab at night    . nitroGLYCERIN (NITROSTAT) 0.4 MG SL tablet Place 1 tablet (0.4 mg total) under the tongue every 5 (five) minutes as needed for chest pain. Reported on 08/10/2015 30 tablet 0  . pravastatin (PRAVACHOL) 40 MG tablet Take 1 tablet (40 mg total) by mouth every evening. 90 tablet 3  . ALPRAZolam (XANAX) 0.25 MG tablet Take 1 tablet (0.25 mg total) by mouth 2 (two) times daily as needed for anxiety. 60 tablet 1  . metoprolol tartrate (LOPRESSOR) 50 MG tablet TAKE ONE TABLET BY MOUTH TWICE A DAY 180 tablet 2   No facility-administered medications prior to visit.     Allergies  Allergen Reactions  . Lipitor [Atorvastatin] Other (See Comments)    HA, depression, vomiting, headache  . Penicillins Anaphylaxis    Has patient had a PCN reaction causing immediate rash, facial/tongue/throat  swelling, SOB or lightheadedness with hypotension: Yes Has patient had a PCN reaction causing severe rash involving mucus membranes or skin necrosis: No Has patient had a PCN reaction that required hospitalization No Has patient had a PCN reaction occurring within the last 10 years: No If all of the above answers are "NO", then may proceed with Cephalosporin use.   . Ambien [Zolpidem Tartrate] Other (See Comments)    Per patient's daughter-patient becomes delirious  . Crestor [Rosuvastatin Calcium] Nausea And Vomiting    Depression, heading    Review of Systems  Constitutional: Negative for fever and malaise/fatigue.  HENT: Negative for congestion.   Eyes: Negative for blurred vision.  Respiratory: Negative for cough and shortness of breath.   Cardiovascular: Negative for chest pain, palpitations and leg swelling.  Gastrointestinal: Negative for vomiting.  Musculoskeletal: Negative for back pain.  Skin: Negative for rash.  Neurological: Negative for loss of consciousness and headaches.       Objective:    Physical Exam  Constitutional: She is oriented to person, place, and time. She appears well-developed and well-nourished.  HENT:  Head: Normocephalic and atraumatic.  Eyes: Conjunctivae and EOM are normal.  Neck: Normal Watkins of motion. Neck supple. No JVD present. Carotid bruit is not present. No thyromegaly present.  Cardiovascular: Normal rate, regular rhythm and normal heart sounds.  No murmur heard. Pulmonary/Chest: Effort normal and breath sounds normal. No respiratory distress. She has no wheezes. She has no rales. She exhibits no tenderness.  Musculoskeletal: She exhibits no edema.  Neurological: She is alert and oriented to person, place, and time.  Psychiatric: She has a normal mood and affect. Her behavior is normal. Judgment and thought content normal.  Nursing note and vitals reviewed.   BP (!) 144/81   Pulse 68   Temp 98.3 F (36.8 C) (Oral)   Resp 16    Ht 5\' 4"  (1.626 m)   Wt 143 lb 6.4 oz (65 kg)   SpO2 98%   BMI 24.61 kg/m  Wt Readings from Last 3 Encounters:  02/09/17 143 lb 6.4 oz (65 kg)  11/07/16 140 lb 6.4 oz (63.7 kg)  08/09/16 142 lb (64.4 kg)   BP Readings from Last 3 Encounters:  02/09/17 (!) 144/81  11/07/16 (!) 196/94  08/09/16 130/80      There is no immunization history on file for this patient.  Health Maintenance  Topic Date Due  . TETANUS/TDAP  05/16/1959  . INFLUENZA VACCINE  09/28/2016  . PNA vac Low Risk Adult (1 of 2 - PCV13) 05/15/2025 (Originally 05/15/2005)  . DEXA SCAN  Completed  Lab Results  Component Value Date   WBC 8.6 10/02/2015   HGB 13.0 10/02/2015   HCT 40.1 10/02/2015   PLT 214 10/02/2015   GLUCOSE 94 02/09/2017   CHOL 211 (H) 02/09/2017   TRIG 121.0 02/09/2017   HDL 51.00 02/09/2017   LDLCALC 136 (H) 02/09/2017   ALT 10 02/09/2017   AST 18 02/09/2017   NA 140 02/09/2017   K 4.4 02/09/2017   CL 103 02/09/2017   CREATININE 0.96 02/09/2017   BUN 24 (H) 02/09/2017   CO2 32 02/09/2017   TSH 2.843 09/28/2015   INR 1.09 09/29/2015    Lab Results  Component Value Date   TSH 2.843 09/28/2015   Lab Results  Component Value Date   WBC 8.6 10/02/2015   HGB 13.0 10/02/2015   HCT 40.1 10/02/2015   MCV 89.1 10/02/2015   PLT 214 10/02/2015   Lab Results  Component Value Date   NA 140 02/09/2017   K 4.4 02/09/2017   CO2 32 02/09/2017   GLUCOSE 94 02/09/2017   BUN 24 (H) 02/09/2017   CREATININE 0.96 02/09/2017   BILITOT 0.5 02/09/2017   ALKPHOS 50 02/09/2017   AST 18 02/09/2017   ALT 10 02/09/2017   PROT 7.2 02/09/2017   ALBUMIN 4.2 02/09/2017   CALCIUM 9.7 02/09/2017   ANIONGAP 7 10/02/2015   GFR 59.94 (L) 02/09/2017   Lab Results  Component Value Date   CHOL 211 (H) 02/09/2017   Lab Results  Component Value Date   HDL 51.00 02/09/2017   Lab Results  Component Value Date   LDLCALC 136 (H) 02/09/2017   Lab Results  Component Value Date   TRIG 121.0  02/09/2017   Lab Results  Component Value Date   CHOLHDL 4 02/09/2017   No results found for: HGBA1C       Assessment & Plan:   Problem List Items Addressed This Visit      Unprioritized   Generalized anxiety disorder    Doing well with occasional xanax       Relevant Medications   ALPRAZolam (XANAX) 0.25 MG tablet   HTN (hypertension) - Primary    Well controlled, no changes to meds. Encouraged heart healthy diet such as the DASH diet and exercise as tolerated.       Relevant Orders   Comprehensive metabolic panel (Completed)   Lipid panel (Completed)   Hyperlipidemia LDL goal <100    Tolerating statin, encouraged heart healthy diet, avoid trans fats, minimize simple carbs and saturated fats. Increase exercise as tolerated      Relevant Orders   Comprehensive metabolic panel (Completed)   Lipid panel (Completed)      I am having Nicole Watkins maintain her Cholecalciferol, aspirin, docusate sodium, nitroGLYCERIN, pravastatin, metoprolol tartrate, amLODipine, lisinopril, and ALPRAZolam.  Meds ordered this encounter  Medications  . ALPRAZolam (XANAX) 0.25 MG tablet    Sig: Take 1 tablet (0.25 mg total) by mouth 2 (two) times daily as needed for anxiety.    Dispense:  60 tablet    Refill:  1    CMA served as scribe during this visit. History, Physical and Plan performed by medical provider. Documentation and orders reviewed and attested to.  Donato SchultzYvonne R Lowne Chase, DO

## 2017-02-09 NOTE — Patient Instructions (Addendum)
Food Choices for Gastroesophageal Reflux Disease, Adult When you have gastroesophageal reflux disease (GERD), the foods you eat and your eating habits are very important. Choosing the right foods can help ease your discomfort. What guidelines do I need to follow?  Choose fruits, vegetables, whole grains, and low-fat dairy products.  Choose low-fat meat, fish, and poultry.  Limit fats such as oils, salad dressings, butter, nuts, and avocado.  Keep a food diary. This helps you identify foods that cause symptoms.  Avoid foods that cause symptoms. These may be different for everyone.  Eat small meals often instead of 3 large meals a day.  Eat your meals slowly, in a place where you are relaxed.  Limit fried foods.  Cook foods using methods other than frying.  Avoid drinking alcohol.  Avoid drinking large amounts of liquids with your meals.  Avoid bending over or lying down until 2-3 hours after eating. What foods are not recommended? These are some foods and drinks that may make your symptoms worse: Vegetables  Tomatoes. Tomato juice. Tomato and spaghetti sauce. Chili peppers. Onion and garlic. Horseradish. Fruits  Oranges, grapefruit, and lemon (fruit and juice). Meats  High-fat meats, fish, and poultry. This includes hot dogs, ribs, ham, sausage, salami, and bacon. Dairy  Whole milk and chocolate milk. Sour cream. Cream. Butter. Ice cream. Cream cheese. Drinks  Coffee and tea. Bubbly (carbonated) drinks or energy drinks. Condiments  Hot sauce. Barbecue sauce. Sweets/Desserts  Chocolate and cocoa. Donuts. Peppermint and spearmint. Fats and Oils  High-fat foods. This includes French fries and potato chips. Other  Vinegar. Strong spices. This includes black pepper, white pepper, red pepper, cayenne, curry powder, cloves, ginger, and chili powder. The items listed above may not be a complete list of foods and drinks to avoid. Contact your dietitian for more information.    This information is not intended to replace advice given to you by your health care provider. Make sure you discuss any questions you have with your health care provider. Document Released: 08/16/2011 Document Revised: 07/23/2015 Document Reviewed: 12/19/2012 Elsevier Interactive Patient Education  2017 Elsevier Inc.  

## 2017-02-09 NOTE — Assessment & Plan Note (Signed)
Tolerating statin, encouraged heart healthy diet, avoid trans fats, minimize simple carbs and saturated fats. Increase exercise as tolerated 

## 2017-02-13 ENCOUNTER — Other Ambulatory Visit: Payer: Self-pay

## 2017-02-13 DIAGNOSIS — I10 Essential (primary) hypertension: Secondary | ICD-10-CM

## 2017-02-25 ENCOUNTER — Other Ambulatory Visit: Payer: Self-pay | Admitting: Cardiology

## 2017-02-25 DIAGNOSIS — I421 Obstructive hypertrophic cardiomyopathy: Secondary | ICD-10-CM

## 2017-05-03 ENCOUNTER — Telehealth: Payer: Self-pay | Admitting: Cardiology

## 2017-05-03 ENCOUNTER — Ambulatory Visit (INDEPENDENT_AMBULATORY_CARE_PROVIDER_SITE_OTHER): Payer: Medicare Other | Admitting: *Deleted

## 2017-05-03 DIAGNOSIS — I442 Atrioventricular block, complete: Secondary | ICD-10-CM | POA: Diagnosis not present

## 2017-05-03 NOTE — Telephone Encounter (Signed)
Spoke with pt and reminded pt of remote transmission that is due today. Pt verbalized understanding.   

## 2017-05-04 ENCOUNTER — Encounter: Payer: Self-pay | Admitting: Cardiology

## 2017-05-04 NOTE — Progress Notes (Signed)
Remote pacemaker transmission.   

## 2017-05-08 LAB — CUP PACEART REMOTE DEVICE CHECK
Battery Remaining Longevity: 88 mo
Battery Voltage: 3.01 V
Brady Statistic AP VP Percent: 48.57 %
Brady Statistic AS VS Percent: 12.51 %
Brady Statistic RA Percent Paced: 46.65 %
Brady Statistic RV Percent Paced: 86.72 %
Date Time Interrogation Session: 20190306211713
Implantable Lead Implant Date: 20170801
Implantable Lead Implant Date: 20170801
Implantable Lead Location: 753860
Implantable Lead Model: 5076
Implantable Pulse Generator Implant Date: 20170801
Lead Channel Impedance Value: 456 Ohm
Lead Channel Impedance Value: 570 Ohm
Lead Channel Pacing Threshold Amplitude: 0.625 V
Lead Channel Pacing Threshold Amplitude: 0.75 V
Lead Channel Pacing Threshold Pulse Width: 0.4 ms
Lead Channel Sensing Intrinsic Amplitude: 3.375 mV
Lead Channel Sensing Intrinsic Amplitude: 3.375 mV
Lead Channel Setting Pacing Amplitude: 2 V
Lead Channel Setting Sensing Sensitivity: 2.8 mV
MDC IDC LEAD LOCATION: 753859
MDC IDC MSMT LEADCHNL RA IMPEDANCE VALUE: 361 Ohm
MDC IDC MSMT LEADCHNL RA IMPEDANCE VALUE: 456 Ohm
MDC IDC MSMT LEADCHNL RV PACING THRESHOLD PULSEWIDTH: 0.4 ms
MDC IDC MSMT LEADCHNL RV SENSING INTR AMPL: 31.625 mV
MDC IDC MSMT LEADCHNL RV SENSING INTR AMPL: 31.625 mV
MDC IDC SET LEADCHNL RV PACING AMPLITUDE: 2.5 V
MDC IDC SET LEADCHNL RV PACING PULSEWIDTH: 0.4 ms
MDC IDC STAT BRADY AP VS PERCENT: 0.86 %
MDC IDC STAT BRADY AS VP PERCENT: 38.06 %

## 2017-06-03 IMAGING — DX DG CHEST 2V
2 series · 2 of 2 positions shown · non-contrast
Comparison: 10/02/2015

CLINICAL DATA: Encounter for chest tube removal; new pacemaker; SOB

EXAM:
CHEST  2 VIEW

[chest pa]
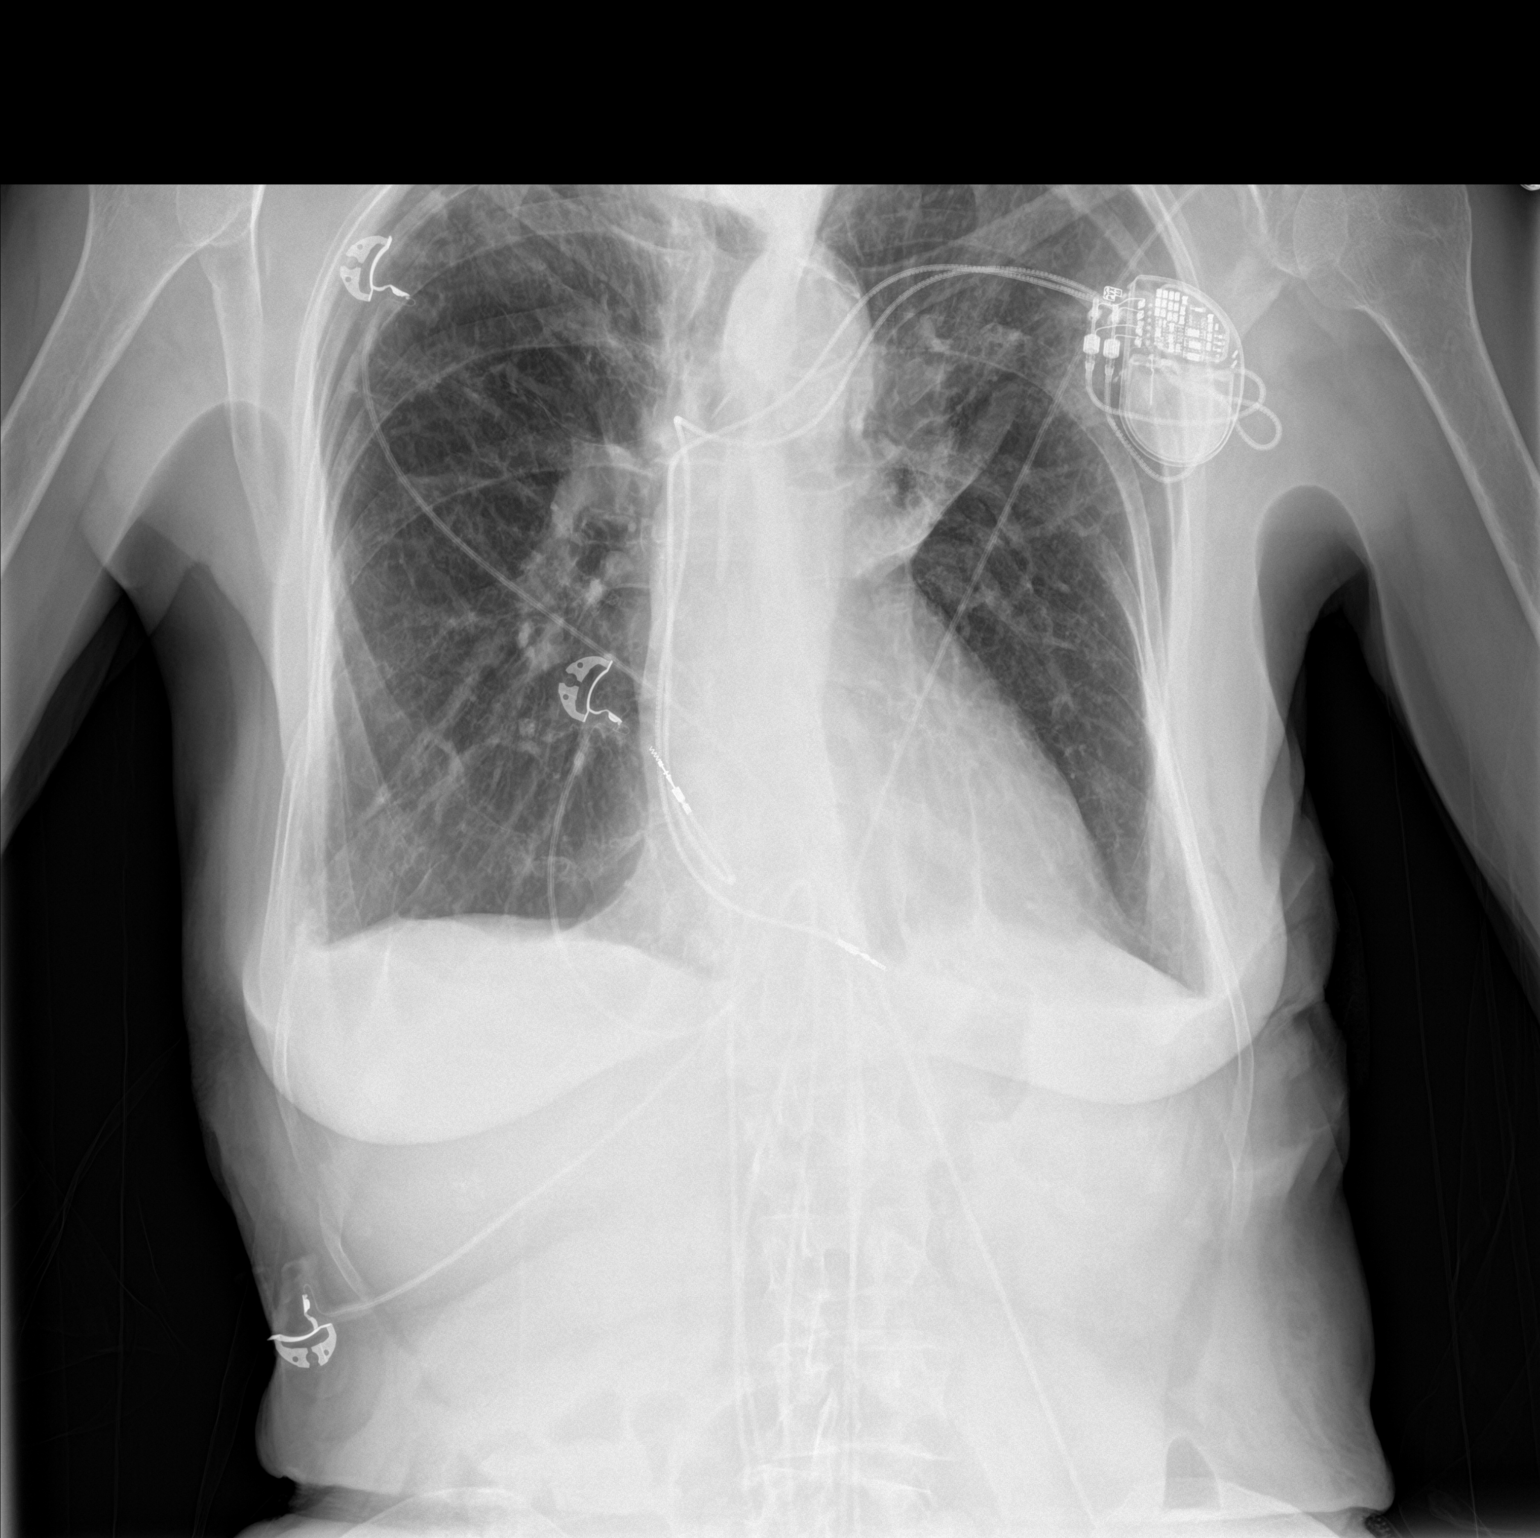

[chest lat]
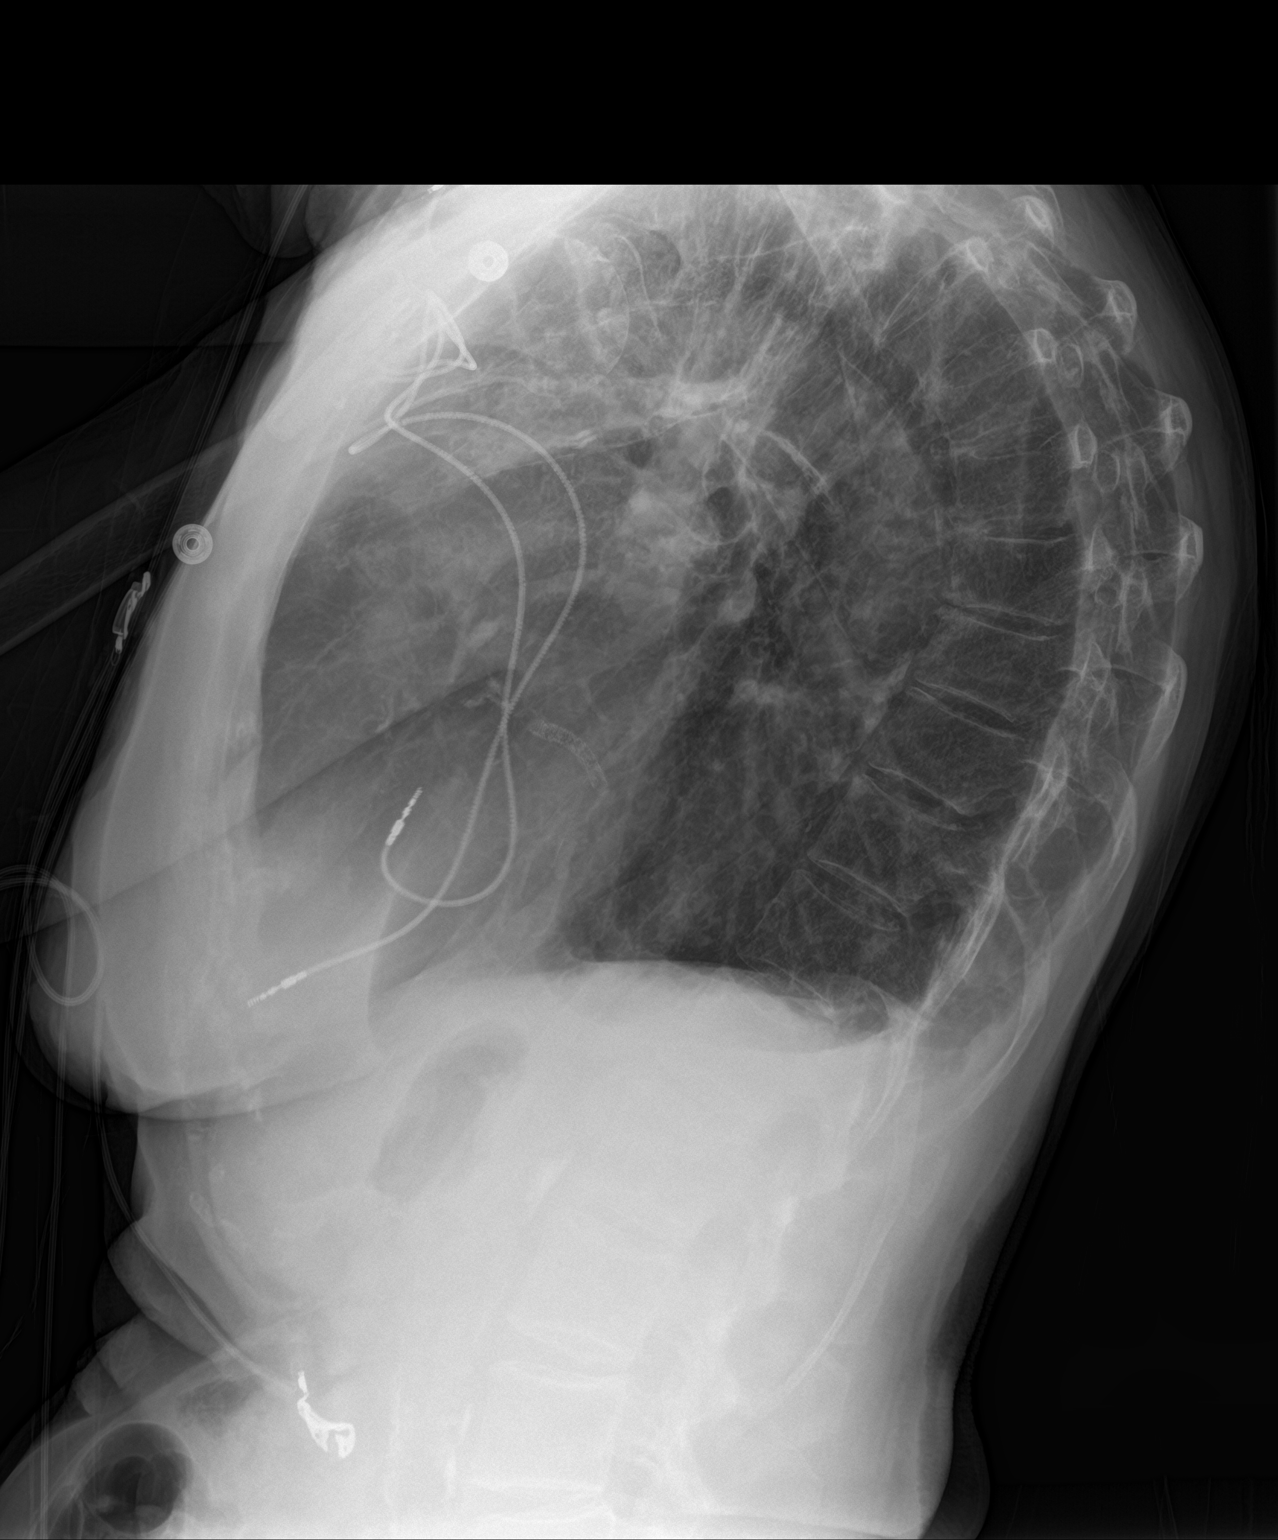

[2 of 2 positions shown; findings below may reference images not displayed]

FINDINGS: Interval removal of LEFT chest tube without pneumothorax. LEFT-sided
pacemaker overlies stable cardiac silhouette. Lungs are
hyperinflated.
IMPRESSION: LEFT chest tube removed without appreciable pneumothorax.

Hyperinflated lungs.

## 2017-08-02 ENCOUNTER — Ambulatory Visit (INDEPENDENT_AMBULATORY_CARE_PROVIDER_SITE_OTHER): Payer: Medicare Other | Admitting: *Deleted

## 2017-08-02 DIAGNOSIS — R55 Syncope and collapse: Secondary | ICD-10-CM

## 2017-08-02 NOTE — Progress Notes (Signed)
Remote pacemaker transmission.   

## 2017-08-09 NOTE — Progress Notes (Signed)
Subjective:   Nicole Watkins is a 77 y.o. female who presents for Medicare Annual (Subsequent) preventive examination. Pt still substitute teaching occasionally.   Review of Systems: No ROS.  Medicare Wellness Visit. Additional risk factors are reflected in the social history. Cardiac Risk Factors include: advanced age (>4455men, 49>65 women);dyslipidemia;hypertension Sleep patterns: Varies. Sleeps 5-6 hrs. Naps as needed. Home Safety/Smoke Alarms: Feels safe in home. Smoke alarms in place.  Living environment; residence and Firearm Safety: Moving into 1 story house. Lives alone.   Female:        Mammo-  order     Dexa scan- order       CCS-pt states she will consider    Objective:     Vitals: BP 120/74 (BP Location: Left Arm, Patient Position: Sitting)   Pulse 62   Ht 5\' 4"  (1.626 m)   Wt 143 lb (64.9 kg)   SpO2 97%   BMI 24.55 kg/m   Body mass index is 24.55 kg/m.  Advanced Directives 08/11/2017 09/28/2015 09/28/2015 08/10/2015 06/16/2015  Does Patient Have a Medical Advance Directive? No No No No No  Would patient like information on creating a medical advance directive? Yes (MAU/Ambulatory/Procedural Areas - Information given) No - patient declined information - Yes - Educational materials given No - patient declined information    Tobacco Social History   Tobacco Use  Smoking Status Former Smoker  Smokeless Tobacco Never Used     Counseling given: Not Answered   Clinical Intake: Pain : No/denies pain      Past Medical History:  Diagnosis Date  . Anxiety   . Arthritis   . CAD (coronary artery disease)   . CHB (complete heart block) (HCC)    a. s/p PPM on 09/29/15  . GERD (gastroesophageal reflux disease)   . Hyperlipidemia   . Hypertension   . LBBB (left bundle branch block)   . Status post placement of cardiac pacemaker    a. 09/2015: Medtronic Advisa MRI model [id number] (serial number [id number]) pacemaker  . Thyroid-related proptosis    Past Surgical  History:  Procedure Laterality Date  . CARDIAC SURGERY     stent placed 03/04/13  . EP IMPLANTABLE DEVICE N/A 06/17/2015   Procedure: Loop Recorder Insertion;  Surgeon: Duke SalviaSteven C Klein, MD;  [facility];  Service: Cardiovascular;  Laterality: N/A;  . EP IMPLANTABLE DEVICE N/A 09/29/2015   Procedure: Pacemaker Implant;  Surgeon: Hillis RangeJames [name], MD;  Location: [facility] INVASIVE CV LAB;  Service: Cardiovascular;  Laterality: N/A;  . No family history     Family History  Problem Relation Age of Onset  . Arthritis Mother   . Hyperlipidemia Mother   . Hypertension Mother   . Hypothyroidism Mother   . Uterine cancer Maternal Grandmother    Social History   Socioeconomic History  . Marital status: Widowed    Spouse name: Not on file  . Number of children: 1  . Years of education: Not on file  . Highest education level: Not on file  Occupational History  . Not on file  Social Needs  . Financial resource strain: Not on file  . Food insecurity:    Worry: Not on file    Inability: Not on file  . Transportation needs:    Medical: Not on file    Non-medical: Not on file  Tobacco Use  . Smoking status: Former Games developermoker  . Smokeless tobacco: Never Used  Substance and Sexual Activity  . Alcohol use: No  Alcohol/week: 0.0 oz  . Drug use: No  . Sexual activity: Not on file  Lifestyle  . Physical activity:    Days per week: Not on file    Minutes per session: Not on file  . Stress: Not on file  Relationships  . Social connections:    Talks on phone: Not on file    Gets together: Not on file    Attends religious service: Not on file    Active member of club or organization: Not on file    Attends meetings of clubs or organizations: Not on file    Relationship status: Not on file  Other Topics Concern  . Not on file  Social History Narrative  . Not on file    Outpatient Encounter Medications as of 08/11/2017  Medication Sig  . amLODipine (NORVASC) 2.5 MG tablet Take 1  tablet (2.5 mg total) by mouth daily.  Marland Kitchen aspirin 81 MG tablet Take 81 mg by mouth every evening. Reported on 08/10/2015  . Cholecalciferol 4000 UNITS CAPS Take 4,000 Units by mouth every evening. Reported on 08/10/2015  . docusate sodium (COLACE) 100 MG capsule Take 100 mg by mouth 2 (two) times daily. Reported on 08/10/2015  . lisinopril (PRINIVIL,ZESTRIL) 20 MG tablet TAKE ONE TABLET TWO TIMES A DAY  . metoprolol tartrate (LOPRESSOR) 50 MG tablet Take 1/2 tab by mouth in the am and 1 tab at night  . nitroGLYCERIN (NITROSTAT) 0.4 MG SL tablet Place 1 tablet (0.4 mg total) under the tongue every 5 (five) minutes as needed for chest pain. Reported on 08/10/2015  . pravastatin (PRAVACHOL) 40 MG tablet Take 1 tablet (40 mg total) by mouth every evening.  . [DISCONTINUED] ALPRAZolam (XANAX) 0.25 MG tablet Take 1 tablet (0.25 mg total) by mouth 2 (two) times daily as needed for anxiety.   No facility-administered encounter medications on file as of 08/11/2017.     Activities of Daily Living In your present state of health, do you have any difficulty performing the following activities: 08/11/2017  Hearing? N  Vision? N  Difficulty concentrating or making decisions? N  Walking or climbing stairs? N  Dressing or bathing? N  Doing errands, shopping? N  Preparing Food and eating ? N  Using the Toilet? N  In the past six months, have you accidently leaked urine? N  Do you have problems with loss of bowel control? N  Managing your Medications? N  Managing your Finances? N  Housekeeping or managing your Housekeeping? N  Some recent data might be hidden    Patient Care Team: [name], Grayling Congress, DO as PCP - General (Family Medicine) [name] Madolyn Frieze, MD as Consulting Physician (Cardiology)    Assessment:   This is a routine wellness examination for Rheba. Physical assessment deferred to PCP.  Exercise Activities and Dietary recommendations Current Exercise Habits: The patient does not  participate in regular exercise at present, Exercise limited by: None identified Diet (meal preparation, eat out, water intake, caffeinated beverages, dairy products, fruits and vegetables): in general, a "healthy" diet        Goals    None      Fall Risk Fall Risk  08/11/2017 02/02/2017 08/10/2015 12/15/2014  Falls in the past year? No No No No  Comment - Emmi Telephone Survey: data to providers prior to load - -    Depression Screen PHQ 2/9 Scores 08/11/2017 08/10/2015 12/15/2014  PHQ - 2 Score 0 1 0     Cognitive Function MMSE -  Mini Mental State Exam 08/11/2017  Orientation to time 5  Orientation to Place 5  Registration 3  Attention/ Calculation 5  Recall 2  Language- name 2 objects 2  Language- repeat 1  Language- follow 3 step command 3  Language- read & follow direction 1  Write a sentence 1  Copy design 1  Total score 29         There is no immunization history on file for this patient.   Screening Tests Health Maintenance  Topic Date Due  . TETANUS/TDAP  05/16/1959  . PNA vac Low Risk Adult (1 of 2 - PCV13) 05/15/2025 (Originally 05/15/2005)  . INFLUENZA VACCINE  09/28/2017  . DEXA SCAN  Completed      Plan:     Please schedule your next medicare wellness visit with me in 1 yr.  Continue to eat heart healthy diet (full of fruits, vegetables, whole grains, lean protein, water--limit salt, fat, and sugar intake) and increase physical activity as tolerated.  Continue doing brain stimulating activities (puzzles, reading, adult coloring books, staying active) to keep memory sharp.    I have personally reviewed and noted the following in the patient's chart:   . Medical and social history . Use of alcohol, tobacco or illicit drugs  . Current medications and supplements . Functional ability and status . Nutritional status . Physical activity . Advanced directives . List of other physicians . Hospitalizations, surgeries, and ER visits in previous 12  months . Vitals . Screenings to include cognitive, depression, and falls . Referrals and appointments  In addition, I have reviewed and discussed with patient certain preventive protocols, quality metrics, and best practice recommendations. A written personalized care plan for preventive services as well as general preventive health recommendations were provided to patient.     Avon Gully, [place]  08/11/2017

## 2017-08-10 ENCOUNTER — Ambulatory Visit: Payer: Medicare Other | Admitting: Family Medicine

## 2017-08-11 ENCOUNTER — Ambulatory Visit (INDEPENDENT_AMBULATORY_CARE_PROVIDER_SITE_OTHER): Payer: Medicare Other | Admitting: *Deleted

## 2017-08-11 ENCOUNTER — Encounter: Payer: Self-pay | Admitting: Family Medicine

## 2017-08-11 ENCOUNTER — Ambulatory Visit (INDEPENDENT_AMBULATORY_CARE_PROVIDER_SITE_OTHER): Payer: Medicare Other | Admitting: Family Medicine

## 2017-08-11 ENCOUNTER — Encounter: Payer: Self-pay | Admitting: *Deleted

## 2017-08-11 VITALS — BP 169/63 | HR 76 | Temp 97.7°F | Resp 16 | Wt 143.6 lb

## 2017-08-11 VITALS — BP 120/74 | HR 62 | Ht 64.0 in | Wt 143.0 lb

## 2017-08-11 DIAGNOSIS — I10 Essential (primary) hypertension: Secondary | ICD-10-CM

## 2017-08-11 DIAGNOSIS — R5383 Other fatigue: Secondary | ICD-10-CM

## 2017-08-11 DIAGNOSIS — Z1239 Encounter for other screening for malignant neoplasm of breast: Secondary | ICD-10-CM

## 2017-08-11 DIAGNOSIS — E785 Hyperlipidemia, unspecified: Secondary | ICD-10-CM

## 2017-08-11 DIAGNOSIS — E538 Deficiency of other specified B group vitamins: Secondary | ICD-10-CM

## 2017-08-11 DIAGNOSIS — E559 Vitamin D deficiency, unspecified: Secondary | ICD-10-CM

## 2017-08-11 DIAGNOSIS — Z79899 Other long term (current) drug therapy: Secondary | ICD-10-CM | POA: Diagnosis not present

## 2017-08-11 DIAGNOSIS — F411 Generalized anxiety disorder: Secondary | ICD-10-CM | POA: Diagnosis not present

## 2017-08-11 DIAGNOSIS — D649 Anemia, unspecified: Secondary | ICD-10-CM | POA: Diagnosis not present

## 2017-08-11 DIAGNOSIS — Z0000 Encounter for general adult medical examination without abnormal findings: Secondary | ICD-10-CM | POA: Diagnosis not present

## 2017-08-11 DIAGNOSIS — Z780 Asymptomatic menopausal state: Secondary | ICD-10-CM

## 2017-08-11 MED ORDER — ALPRAZOLAM 0.25 MG PO TABS: 0.2500 mg | ORAL_TABLET | Freq: Two times a day (BID) | ORAL | 1 refills | Status: AC | PRN

## 2017-08-11 NOTE — Progress Notes (Signed)
Subjective:  I acted as a Neurosurgeonscribe for CMS Energy CorporationDr.Lowne-Watkins. Fuller SongShaneka, RMA   Patient ID: Nicole BorsJuliette Watkins, female    DOB: 1940/12/30, 77 y.o.   MRN: [id number]  Chief Complaint  Patient presents with  . Hypertension    HPI  Patient is in today for follow up on hypertension.  She is c/o fatigue and knows it may be meds but she is concerned.    Patient Care Team: Nicole ButtonLowne Watkins, Nicole CongressYvonne R, DO as PCP - General (Family Medicine) Nicole Watkins, Nicole FriezeBrian S, MD as Consulting Physician (Cardiology)   Past Medical History:  Diagnosis Date  . Anxiety   . Arthritis   . CAD (coronary artery disease)   . CHB (complete heart block) (HCC)    a. Watkins/p PPM on 09/29/15  . GERD (gastroesophageal reflux disease)   . Hyperlipidemia   . Hypertension   . LBBB (left bundle branch block)   . Status post placement of cardiac pacemaker    a. 09/2015: Medtronic Advisa MRI model [id number] (serial number [id number]) pacemaker  . Thyroid-related proptosis     Past Surgical History:  Procedure Laterality Date  . CARDIAC SURGERY     stent placed 03/04/13  . EP IMPLANTABLE DEVICE N/A 06/17/2015   Procedure: Loop Recorder Insertion;  Surgeon: Duke SalviaSteven C Klein, MD;  [facility];  Service: Cardiovascular;  Laterality: N/A;  . EP IMPLANTABLE DEVICE N/A 09/29/2015   Procedure: Pacemaker Implant;  Surgeon: Nicole RangeJames [name], MD;  Location: [facility] INVASIVE CV LAB;  Service: Cardiovascular;  Laterality: N/A;  . No family history      Family History  Problem Relation Age of Onset  . Arthritis Mother   . Hyperlipidemia Mother   . Hypertension Mother   . Hypothyroidism Mother   . Uterine cancer Maternal Grandmother     Social History   Socioeconomic History  . Marital status: Widowed    Spouse name: Not on file  . Number of children: 1  . Years of education: Not on file  . Highest education level: Not on file  Occupational History  . Not on file  Social Needs  . Financial resource strain: Not on file  . Food insecurity:    Worry: Not on file    Inability: Not on file  . Transportation needs:    Medical: Not on file    Non-medical: Not on file  Tobacco Use  . Smoking status: Former Games developermoker  . Smokeless tobacco: Never Used  Substance and Sexual Activity  . Alcohol use: No    Alcohol/week: 0.0 oz  . Drug use: No  . Sexual activity: Not on file  Lifestyle  . Physical activity:    Days per week: Not on file    Minutes per session: Not on file  . Stress: Not on file  Relationships  . Social connections:    Talks on phone: Not on file    Gets together: Not on file    Attends religious service: Not on file    Active member of club or organization: Not on file    Attends meetings of clubs or organizations: Not on file    Relationship status: Not on file  . Intimate partner violence:    Fear of current or ex partner: Not on file    Emotionally abused: Not on file    Physically abused: Not on file    Forced sexual activity: Not on file  Other Topics Concern  . Not on file  Social History Narrative  .  Not on file    Outpatient Medications Prior to Visit  Medication Sig Dispense Refill  . aspirin 81 MG tablet Take 81 mg by mouth every evening. Reported on 08/10/2015    . Cholecalciferol 4000 UNITS CAPS Take 4,000 Units by mouth every evening. Reported on 08/10/2015    . docusate sodium (COLACE) 100 MG capsule Take 100 mg by mouth 2 (two) times daily. Reported on 08/10/2015    . lisinopril (PRINIVIL,ZESTRIL) 20 MG tablet TAKE ONE TABLET TWO TIMES A DAY 180 tablet 3  . metoprolol tartrate (LOPRESSOR) 50 MG tablet Take 1/2 tab by mouth in the am and 1 tab at night    . nitroGLYCERIN (NITROSTAT) 0.4 MG SL tablet Place 1 tablet (0.4 mg total) under the tongue every 5 (five) minutes as needed for chest pain. Reported on 08/10/2015 30 tablet 0  . ALPRAZolam (XANAX) 0.25 MG tablet Take 1 tablet (0.25 mg total) by mouth 2 (two) times daily as needed for anxiety. 60 tablet 1  . amLODipine (NORVASC) 2.5 MG tablet  Take 1 tablet (2.5 mg total) by mouth daily. 180 tablet 3  . pravastatin (PRAVACHOL) 40 MG tablet Take 1 tablet (40 mg total) by mouth every evening. 90 tablet 3   No facility-administered medications prior to visit.     Allergies  Allergen Reactions  . Lipitor [Atorvastatin] Other (See Comments)    HA, depression, vomiting, headache  . Penicillins Anaphylaxis    Has patient had a PCN reaction causing immediate rash, facial/tongue/throat swelling, SOB or lightheadedness with hypotension: Yes Has patient had a PCN reaction causing severe rash involving mucus membranes or skin necrosis: No Has patient had a PCN reaction that required hospitalization No Has patient had a PCN reaction occurring within the last 10 years: No If all of the above answers are "NO", then may proceed with Cephalosporin use.   . Ambien [Zolpidem Tartrate] Other (See Comments)    Per patient'Watkins daughter-patient becomes delirious  . Crestor [Rosuvastatin Calcium] Nausea And Vomiting    Depression, heading    ROS     Objective:    Physical Exam  Constitutional: She is oriented to person, place, and time. She appears well-developed and well-nourished.  HENT:  Head: Normocephalic and atraumatic.  Eyes: Conjunctivae and EOM are normal.  Neck: Normal Watkins of motion. Neck supple. No JVD present. Carotid bruit is not present. No thyromegaly present.  Cardiovascular: Normal rate and regular rhythm.  Murmur heard. Pulmonary/Chest: Effort normal and breath sounds normal. No respiratory distress. She has no wheezes. She has no rales. She exhibits no tenderness.  Musculoskeletal: She exhibits no edema.  Neurological: She is alert and oriented to person, place, and time.  Psychiatric: She has a normal mood and affect.  Nursing note and vitals reviewed.   BP (!) 169/63 (BP Location: Left Arm, Patient Position: Sitting, Cuff Size: Normal)   Pulse 76   Temp 97.7 F (36.5 C) (Oral)   Resp 16   Wt 143 lb 9.6 oz (65.1  kg)   SpO2 97%   BMI 24.65 kg/m  Wt Readings from Last 3 Encounters:  08/11/17 143 lb (64.9 kg)  08/11/17 143 lb 9.6 oz (65.1 kg)  02/09/17 143 lb 6.4 oz (65 kg)   BP Readings from Last 3 Encounters:  08/11/17 120/74  08/11/17 (!) 169/63  02/09/17 (!) 144/81      There is no immunization history on file for this patient.  Health Maintenance  Topic Date Due  . TETANUS/TDAP  05/16/1959  . PNA vac Low Risk Adult (1 of 2 - PCV13) 05/15/2025 (Originally 05/15/2005)  . INFLUENZA VACCINE  09/28/2017  . DEXA SCAN  Completed    Lab Results  Component Value Date   WBC 7.5 08/11/2017   HGB 14.0 08/11/2017   HCT 41.5 08/11/2017   PLT 314 08/11/2017   GLUCOSE 86 08/11/2017   CHOL 211 (H) 02/09/2017   TRIG 121.0 02/09/2017   HDL 51.00 02/09/2017   LDLCALC 136 (H) 02/09/2017   ALT 12 08/11/2017   AST 20 08/11/2017   NA 141 08/11/2017   K 4.5 08/11/2017   CL 102 08/11/2017   CREATININE 1.14 (H) 08/11/2017   BUN 25 08/11/2017   CO2 31 08/11/2017   TSH 2.843 09/28/2015   INR 1.09 09/29/2015    Lab Results  Component Value Date   TSH 2.843 09/28/2015   Lab Results  Component Value Date   WBC 7.5 08/11/2017   HGB 14.0 08/11/2017   HCT 41.5 08/11/2017   MCV 87.6 08/11/2017   PLT 314 08/11/2017   Lab Results  Component Value Date   NA 141 08/11/2017   K 4.5 08/11/2017   CO2 31 08/11/2017   GLUCOSE 86 08/11/2017   BUN 25 08/11/2017   CREATININE 1.14 (H) 08/11/2017   BILITOT 0.4 08/11/2017   ALKPHOS 50 02/09/2017   AST 20 08/11/2017   ALT 12 08/11/2017   PROT 6.8 08/11/2017   ALBUMIN 4.2 02/09/2017   CALCIUM 9.9 08/11/2017   ANIONGAP 7 10/02/2015   GFR 59.94 (L) 02/09/2017   Lab Results  Component Value Date   CHOL 211 (H) 02/09/2017   Lab Results  Component Value Date   HDL 51.00 02/09/2017   Lab Results  Component Value Date   LDLCALC 136 (H) 02/09/2017   Lab Results  Component Value Date   TRIG 121.0 02/09/2017   Lab Results  Component Value  Date   CHOLHDL 4 02/09/2017   No results found for: HGBA1C       Assessment & Plan:   Problem List Items Addressed This Visit      Unprioritized   Anemia   Relevant Orders   IBC panel   CBC with Differential/Platelet (Completed)   Iron, TIBC and Ferritin Panel (Completed)   B12 deficiency   Relevant Orders   B12 and Folate Panel (Completed)   Generalized anxiety disorder   Relevant Medications   ALPRAZolam (XANAX) 0.25 MG tablet   HTN (hypertension)    Well controlled, no changes to meds. Encouraged heart healthy diet such as the DASH diet and exercise as tolerated.       Hyperlipidemia    Tolerating statin, encouraged heart healthy diet, avoid trans fats, minimize simple carbs and saturated fats. Increase exercise as tolerated      Relevant Orders   Comprehensive metabolic panel (Completed)   Other fatigue    Check labs May be from meds      Relevant Orders   IBC panel   Vitamin D deficiency   Relevant Orders   VITAMIN D 25 Hydroxy (Vit-D Deficiency, Fractures) (Completed)    Other Visit Diagnoses    High risk medication use    -  Primary   Relevant Orders   Pain Mgmt, Profile 8 w/Conf, U      I am having Zainah Squyres maintain her Cholecalciferol, aspirin, docusate sodium, nitroGLYCERIN, pravastatin, metoprolol tartrate, amLODipine, lisinopril, and ALPRAZolam.  Meds ordered this encounter  Medications  . ALPRAZolam (XANAX) 0.25 MG  tablet    Sig: Take 1 tablet (0.25 mg total) by mouth 2 (two) times daily as needed for anxiety.    Dispense:  60 tablet    Refill:  1    CMA served as scribe during this visit. History, Physical and Plan performed by medical provider. Documentation and orders reviewed and attested to.  Donato Schultz, DO

## 2017-08-11 NOTE — Patient Instructions (Signed)
  Please schedule your next medicare wellness visit with me in 1 yr.  Continue to eat heart healthy diet (full of fruits, vegetables, whole grains, lean protein, water--limit salt, fat, and sugar intake) and increase physical activity as tolerated.  Continue doing brain stimulating activities (puzzles, reading, adult coloring books, staying active) to keep memory sharp.    Ms. Nicole Watkins , Thank you for taking time to come for your Medicare Wellness Visit. I appreciate your ongoing commitment to your health goals. Please review the following plan we discussed and let me know if I can assist you in the future.   These are the goals we discussed: Goals    None      This is a list of the screening recommended for you and due dates:  Health Maintenance  Topic Date Due  . Tetanus Vaccine  05/16/1959  . Pneumonia vaccines (1 of 2 - PCV13) 05/15/2025*  . Flu Shot  09/28/2017  . DEXA scan (bone density measurement)  Completed  *Topic was postponed. The date shown is not the original due date.

## 2017-08-12 DIAGNOSIS — R5383 Other fatigue: Secondary | ICD-10-CM | POA: Insufficient documentation

## 2017-08-12 DIAGNOSIS — E538 Deficiency of other specified B group vitamins: Secondary | ICD-10-CM | POA: Insufficient documentation

## 2017-08-12 DIAGNOSIS — D649 Anemia, unspecified: Secondary | ICD-10-CM | POA: Insufficient documentation

## 2017-08-12 NOTE — Progress Notes (Signed)
Reviewed  [name], DO  

## 2017-08-12 NOTE — Assessment & Plan Note (Signed)
Well controlled, no changes to meds. Encouraged heart healthy diet such as the DASH diet and exercise as tolerated.  °

## 2017-08-12 NOTE — Assessment & Plan Note (Signed)
Tolerating statin, encouraged heart healthy diet, avoid trans fats, minimize simple carbs and saturated fats. Increase exercise as tolerated 

## 2017-08-12 NOTE — Assessment & Plan Note (Signed)
Check labs May be from meds

## 2017-08-14 LAB — PAIN MGMT, PROFILE 8 W/CONF, U
6 Acetylmorphine: NEGATIVE ng/mL (ref <10)
ALPHAHYDROXYMIDAZOLAM: NEGATIVE ng/mL (ref <50)
Alcohol Metabolites: NEGATIVE ng/mL (ref <500)
Alphahydroxyalprazolam: 34 ng/mL — ABNORMAL HIGH (ref <25)
Alphahydroxytriazolam: NEGATIVE ng/mL (ref <50)
Aminoclonazepam: NEGATIVE ng/mL (ref <25)
Amphetamines: NEGATIVE ng/mL (ref <500)
Benzodiazepines: POSITIVE ng/mL — AB (ref <100)
Buprenorphine, Urine: NEGATIVE ng/mL (ref <5)
COCAINE METABOLITE: NEGATIVE ng/mL (ref <150)
Creatinine: 77.6 mg/dL
HYDROXYETHYLFLURAZEPAM: NEGATIVE ng/mL (ref <50)
LORAZEPAM: NEGATIVE ng/mL (ref <50)
MARIJUANA METABOLITE: NEGATIVE ng/mL (ref <20)
MDMA: NEGATIVE ng/mL (ref <500)
Nordiazepam: NEGATIVE ng/mL (ref <50)
OXAZEPAM: NEGATIVE ng/mL (ref <50)
OXYCODONE: NEGATIVE ng/mL (ref <100)
Opiates: NEGATIVE ng/mL (ref <100)
Oxidant: NEGATIVE ug/mL (ref <200)
Temazepam: NEGATIVE ng/mL (ref <50)
pH: 6.73 (ref 4.5–9.0)

## 2017-08-14 LAB — CBC WITH DIFFERENTIAL/PLATELET
BASOS ABS: 53 {cells}/uL (ref 0–200)
Basophils Relative: 0.7 %
Eosinophils Absolute: 188 cells/uL (ref 15–500)
Eosinophils Relative: 2.5 %
HCT: 41.5 % (ref 35.0–45.0)
Hemoglobin: 14 g/dL (ref 11.7–15.5)
Lymphs Abs: 1133 cells/uL (ref 850–3900)
MCH: 29.5 pg (ref 27.0–33.0)
MCHC: 33.7 g/dL (ref 32.0–36.0)
MCV: 87.6 fL (ref 80.0–100.0)
MONOS PCT: 11.2 %
MPV: 10.8 fL (ref 7.5–12.5)
NEUTROS PCT: 70.5 %
Neutro Abs: 5288 cells/uL (ref 1500–7800)
Platelets: 314 10*3/uL (ref 140–400)
RBC: 4.74 10*6/uL (ref 3.80–5.10)
RDW: 13.1 % (ref 11.0–15.0)
Total Lymphocyte: 15.1 %
WBC mixed population: 840 cells/uL (ref 200–950)
WBC: 7.5 10*3/uL (ref 3.8–10.8)

## 2017-08-14 LAB — IRON,TIBC AND FERRITIN PANEL
%SAT: 25 % (calc) (ref 11–50)
Ferritin: 46 ng/mL (ref 20–288)
IRON: 82 ug/dL (ref 45–160)
TIBC: 330 mcg/dL (calc) (ref 250–450)

## 2017-08-14 LAB — COMPREHENSIVE METABOLIC PANEL
AG Ratio: 1.6 (calc) (ref 1.0–2.5)
ALT: 12 U/L (ref 6–29)
AST: 20 U/L (ref 10–35)
Albumin: 4.2 g/dL (ref 3.6–5.1)
Alkaline phosphatase (APISO): 56 U/L (ref 33–130)
BUN / CREAT RATIO: 22 (calc) (ref 6–22)
BUN: 25 mg/dL (ref 7–25)
CALCIUM: 9.9 mg/dL (ref 8.6–10.4)
CO2: 31 mmol/L (ref 20–32)
Chloride: 102 mmol/L (ref 98–110)
Creat: 1.14 mg/dL — ABNORMAL HIGH (ref 0.60–0.93)
GLUCOSE: 86 mg/dL (ref 65–99)
Globulin: 2.6 g/dL (calc) (ref 1.9–3.7)
Potassium: 4.5 mmol/L (ref 3.5–5.3)
Sodium: 141 mmol/L (ref 135–146)
Total Bilirubin: 0.4 mg/dL (ref 0.2–1.2)
Total Protein: 6.8 g/dL (ref 6.1–8.1)

## 2017-08-14 LAB — VITAMIN D 25 HYDROXY (VIT D DEFICIENCY, FRACTURES): VIT D 25 HYDROXY: 57 ng/mL (ref 30–100)

## 2017-08-14 LAB — B12 AND FOLATE PANEL
Folate: 11 ng/mL
Vitamin B-12: 670 pg/mL (ref 200–1100)

## 2017-08-16 ENCOUNTER — Other Ambulatory Visit: Payer: Self-pay | Admitting: Cardiology

## 2017-08-16 DIAGNOSIS — E785 Hyperlipidemia, unspecified: Secondary | ICD-10-CM

## 2017-08-17 NOTE — Telephone Encounter (Signed)
Rx(s) sent to pharmacy electronically.  

## 2017-09-20 LAB — CUP PACEART REMOTE DEVICE CHECK
Battery Remaining Longevity: 86 mo
Brady Statistic AP VS Percent: 0.16 %
Brady Statistic RV Percent Paced: 98.03 %
Date Time Interrogation Session: 20190605150650
Implantable Lead Implant Date: 20170801
Implantable Lead Implant Date: 20170801
Implantable Lead Location: 753859
Implantable Lead Model: 5076
Implantable Pulse Generator Implant Date: 20170801
Lead Channel Impedance Value: 532 Ohm
Lead Channel Pacing Threshold Amplitude: 0.75 V
Lead Channel Sensing Intrinsic Amplitude: 1.5 mV
Lead Channel Sensing Intrinsic Amplitude: 1.5 mV
Lead Channel Sensing Intrinsic Amplitude: 31.625 mV
Lead Channel Setting Pacing Amplitude: 2.5 V
Lead Channel Setting Pacing Pulse Width: 0.4 ms
Lead Channel Setting Sensing Sensitivity: 2.8 mV
MDC IDC LEAD LOCATION: 753860
MDC IDC MSMT BATTERY VOLTAGE: 3.01 V
MDC IDC MSMT LEADCHNL RA IMPEDANCE VALUE: 361 Ohm
MDC IDC MSMT LEADCHNL RA IMPEDANCE VALUE: 456 Ohm
MDC IDC MSMT LEADCHNL RA PACING THRESHOLD AMPLITUDE: 0.625 V
MDC IDC MSMT LEADCHNL RA PACING THRESHOLD PULSEWIDTH: 0.4 ms
MDC IDC MSMT LEADCHNL RV IMPEDANCE VALUE: 437 Ohm
MDC IDC MSMT LEADCHNL RV PACING THRESHOLD PULSEWIDTH: 0.4 ms
MDC IDC MSMT LEADCHNL RV SENSING INTR AMPL: 31.625 mV
MDC IDC SET LEADCHNL RA PACING AMPLITUDE: 2 V
MDC IDC STAT BRADY AP VP PERCENT: 56.78 %
MDC IDC STAT BRADY AS VP PERCENT: 41.2 %
MDC IDC STAT BRADY AS VS PERCENT: 1.85 %
MDC IDC STAT BRADY RA PERCENT PACED: 53.27 %

## 2017-09-25 NOTE — Progress Notes (Deleted)
HPI: FU coronary artery disease. Previously cared for at [facility]. Patient suffered a non-ST elevation myocardial infarction in January 2015. She had a drug-eluting stent to the Lcx but I do not have details available. She also carries a diagnosis of supraventricular tachycardia and left bundle branch block. Carotid Dopplers December 2016 showed no hemodynamically significant stenosis. Abdominal ultrasound December 2016 showed mild abdominal aortic ectasia at 2.8 cm. Follow-up recommended 5 years. She has seen psychiatry for problems with stress related to her previous myocardial infarction and loss of her husband. Echo 09/29/15 showed normal LV systolic function, focal septal hypertrophy, mild AS/mild AI, SAM, mild MR, moderate LAE. 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 Procedure was complicated by pneumothorax. This required chest tube placement. Since last seen,   Current Outpatient Medications  Medication Sig Dispense Refill  . ALPRAZolam (XANAX) 0.25 MG tablet Take 1 tablet (0.25 mg total) by mouth 2 (two) times daily as needed for anxiety. 60 tablet 1  . amLODipine (NORVASC) 2.5 MG tablet Take 1 tablet (2.5 mg total) by mouth daily. 180 tablet 3  . aspirin 81 MG tablet Take 81 mg by mouth every evening. Reported on 08/10/2015    . Cholecalciferol 4000 UNITS CAPS Take 4,000 Units by mouth every evening. Reported on 08/10/2015    . docusate sodium (COLACE) 100 MG capsule Take 100 mg by mouth 2 (two) times daily. Reported on 08/10/2015    . lisinopril (PRINIVIL,ZESTRIL) 20 MG tablet TAKE ONE TABLET TWO TIMES A DAY 180 tablet 3  . metoprolol tartrate (LOPRESSOR) 50 MG tablet Take 1/2 tab by mouth in the am and 1 tab at night    . nitroGLYCERIN (NITROSTAT) 0.4 MG SL tablet Place 1 tablet (0.4 mg total) under the tongue every 5 (five) minutes as needed for chest pain. Reported on 08/10/2015 30 tablet 0  . pravastatin  (PRAVACHOL) 40 MG tablet Take 1 tablet (40 mg total) by mouth every evening. 90 tablet 3  . pravastatin (PRAVACHOL) 40 MG tablet TAKE ONE TABLET BY MOUTH EVERY EVENING 90 tablet 0   No current facility-administered medications for this visit.      Past Medical History:  Diagnosis Date  . Anxiety   . Arthritis   . CAD (coronary artery disease)   . CHB (complete heart block) (HCC)    a. s/p PPM on 09/29/15  . GERD (gastroesophageal reflux disease)   . Hyperlipidemia   . Hypertension   . LBBB (left bundle branch block)   . Status post placement of cardiac pacemaker    a. 09/2015: Medtronic Advisa MRI model [id number] (serial number [id number]) pacemaker  . Thyroid-related proptosis     Past Surgical History:  Procedure Laterality Date  . CARDIAC SURGERY     stent placed 03/04/13  . EP IMPLANTABLE DEVICE N/A 06/17/2015   Procedure: Loop Recorder Insertion;  Surgeon: [name], MD;  Location: [facility] INVASIVE CV LAB;  Service: Cardiovascular;  Laterality: N/A;  . EP IMPLANTABLE DEVICE N/A 09/29/2015   Procedure: Pacemaker Implant;  Surgeon: [name], MD;  Location: [facility] INVASIVE CV LAB;  Service: Cardiovascular;  Laterality: N/A;  . No family history      Social History   Socioeconomic History  . Marital status: Widowed    Spouse name: Not on file  . Number of children: 1  . Years of education: Not on file  . Highest education level: Not on  file  Occupational History  . Not on file  Social Needs  . Financial resource strain: Not on file  . Food insecurity:    Worry: Not on file    Inability: Not on file  . Transportation needs:    Medical: Not on file    Non-medical: Not on file  Tobacco Use  . Smoking status: Former Games developermoker  . Smokeless tobacco: Never Used  Substance and Sexual Activity  . Alcohol use: No    Alcohol/week: 0.0 oz  . Drug use: No  . Sexual activity: Not on file  Lifestyle  . Physical activity:    Days per week: Not on file    Minutes per session: Not on  file  . Stress: Not on file  Relationships  . Social connections:    Talks on phone: Not on file    Gets together: Not on file    Attends religious service: Not on file    Active member of club or organization: Not on file    Attends meetings of clubs or organizations: Not on file    Relationship status: Not on file  . Intimate partner violence:    Fear of current or ex partner: Not on file    Emotionally abused: Not on file    Physically abused: Not on file    Forced sexual activity: Not on file  Other Topics Concern  . Not on file  Social History Narrative  . Not on file    Family History  Problem Relation Age of Onset  . Arthritis Mother   . Hyperlipidemia Mother   . Hypertension Mother   . Hypothyroidism Mother   . Uterine cancer Maternal Grandmother     ROS: no fevers or chills, productive cough, hemoptysis, dysphasia, odynophagia, melena, hematochezia, dysuria, hematuria, rash, seizure activity, orthopnea, PND, pedal edema, claudication. Remaining systems are negative.  Physical Exam: Well-developed well-nourished in no acute distress.  Skin is warm and dry.  HEENT is normal.  Neck is supple.  Chest is clear to auscultation with normal expansion.  Cardiovascular exam is regular rate and rhythm.  Abdominal exam nontender or distended. No masses palpated. Extremities show no edema. neuro grossly intact  ECG- personally reviewed  A/P  1  Olga MillersBrian Crenshaw, MD

## 2017-09-27 ENCOUNTER — Ambulatory Visit: Payer: Medicare Other | Admitting: Cardiology

## 2017-10-09 NOTE — Progress Notes (Signed)
HPI: FU coronary artery disease. [facility]. Patient suffered a non-ST elevation myocardial infarction in January 2015. She had a drug-eluting stent to the Lcx but I do not have details available. She also carries a diagnosis of supraventricular tachycardia and left bundle branch block. Carotid Dopplers December 2016 showed no hemodynamically significant stenosis. Abdominal ultrasound December 2016 showed mild abdominal aortic ectasia at 2.8 cm. Follow-up recommended 5 years. She has seen psychiatry for problems with stress related to her previous myocardial infarction and loss of her husband. Echo 09/29/15 showed normal LV systolic function, focal septal hypertrophy, mild AS/mild AI, SAM, mild MR, moderate LAE. 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 Procedure was complicated by pneumothorax. This required chest tube placement. Since last seen,  patient denies dyspnea, chest pain, palpitations or syncope.  Current Outpatient Medications  Medication Sig Dispense Refill  . ALPRAZolam (XANAX) 0.25 MG tablet Take 1 tablet (0.25 mg total) by mouth 2 (two) times daily as needed for anxiety. 60 tablet 1  . amLODipine (NORVASC) 2.5 MG tablet Take 1 tablet (2.5 mg total) by mouth daily. 180 tablet 3  . aspirin 81 MG tablet Take 81 mg by mouth every evening. Reported on 08/10/2015    . Cholecalciferol 4000 UNITS CAPS Take 4,000 Units by mouth every evening. Reported on 08/10/2015    . docusate sodium (COLACE) 100 MG capsule Take 100 mg by mouth 2 (two) times daily. Reported on 08/10/2015    . lisinopril (PRINIVIL,ZESTRIL) 20 MG tablet TAKE ONE TABLET TWO TIMES A DAY 180 tablet 3  . metoprolol tartrate (LOPRESSOR) 50 MG tablet Take 1/2 tab by mouth in the am and 1 tab at night    . nitroGLYCERIN (NITROSTAT) 0.4 MG SL tablet Place 1 tablet (0.4 mg total) under the tongue every 5 (five) minutes as needed for  chest pain. Reported on 08/10/2015 30 tablet 0  . pravastatin (PRAVACHOL) 40 MG tablet Take 1 tablet (40 mg total) by mouth every evening. 90 tablet 3  . pravastatin (PRAVACHOL) 40 MG tablet TAKE ONE TABLET BY MOUTH EVERY EVENING 90 tablet 0   No current facility-administered medications for this visit.      Past Medical History:  Diagnosis Date  . Anxiety   . Arthritis   . CAD (coronary artery disease)   . CHB (complete heart block) (HCC)    a. s/p PPM on 09/29/15  . GERD (gastroesophageal reflux disease)   . Hyperlipidemia   . Hypertension   . LBBB (left bundle branch block)   . Status post placement of cardiac pacemaker    a. 09/2015: Medtronic Advisa MRI model [id number] (serial number [id number]) pacemaker  . Thyroid-related proptosis     Past Surgical History:  Procedure Laterality Date  . CARDIAC SURGERY     stent placed 03/04/13  . EP IMPLANTABLE DEVICE N/A 06/17/2015   Procedure: Loop Recorder Insertion;  Surgeon: Duke SalviaSteven C Klein, MD;  [facility];  Service: Cardiovascular;  Laterality: N/A;  . EP IMPLANTABLE DEVICE N/A 09/29/2015   Procedure: Pacemaker Implant;  Surgeon: Hillis RangeJames [name], MD;  Location: [facility] INVASIVE CV LAB;  Service: Cardiovascular;  Laterality: N/A;  . No family history      Social History   Socioeconomic History  . Marital status: Widowed    Spouse name: Not on file  . Number of children: 1  . Years of education: Not on  file  . Highest education level: Not on file  Occupational History  . Not on file  Social Needs  . Financial resource strain: Not on file  . Food insecurity:    Worry: Not on file    Inability: Not on file  . Transportation needs:    Medical: Not on file    Non-medical: Not on file  Tobacco Use  . Smoking status: Former Games developermoker  . Smokeless tobacco: Never Used  Substance and Sexual Activity  . Alcohol use: No    Alcohol/week: 0.0 standard drinks  . Drug use: No  . Sexual activity: Not on file  Lifestyle  .  Physical activity:    Days per week: Not on file    Minutes per session: Not on file  . Stress: Not on file  Relationships  . Social connections:    Talks on phone: Not on file    Gets together: Not on file    Attends religious service: Not on file    Active member of club or organization: Not on file    Attends meetings of clubs or organizations: Not on file    Relationship status: Not on file  . Intimate partner violence:    Fear of current or ex partner: Not on file    Emotionally abused: Not on file    Physically abused: Not on file    Forced sexual activity: Not on file  Other Topics Concern  . Not on file  Social History Narrative  . Not on file    Family History  Problem Relation Age of Onset  . Arthritis Mother   . Hyperlipidemia Mother   . Hypertension Mother   . Hypothyroidism Mother   . Uterine cancer Maternal Grandmother     ROS: Some leg fatigue but no fevers or chills, productive cough, hemoptysis, dysphasia, odynophagia, melena, hematochezia, dysuria, hematuria, rash, seizure activity, orthopnea, PND, pedal edema, claudication. Remaining systems are negative.  Physical Exam: Well-developed well-nourished in no acute distress.  Skin is warm and dry.  HEENT is normal.  Neck is supple.  Chest is clear to auscultation with normal expansion.  Cardiovascular exam is regular rate and rhythm.  Abdominal exam nontender or distended. No masses palpated. Extremities show no edema. neuro grossly intact  ECG-sinus rhythm with ventricular pacing.  Prolonged PR interval.  Personally reviewed  A/P  1 coronary disease-patient denies chest pain.  Continue medical therapy with aspirin and statin.  2 hypertension-blood pressure is elevated.  Increase amlodipine to 5 mg daily and follow.  3 hyperlipidemia-continue statin.  Check lipids and liver.  Note she has not tolerated Crestor, Zocor or Lipitor previously.  4 abdominal aortic aneurysm-patient will need follow-up  ultrasound December 2021.  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  We did perform an exercise treadmill previously that did not show a hypotensive response, previous monitor showed no nonsustained ventricular tachycardia and there is no family history of sudden death.  6 prior pacemaker-followed by Dr. Johney FrameAllred.  7 murmur-louder on examination today.  Repeat echocardiogram.  Olga MillersBrian Crenshaw, MD

## 2017-10-11 ENCOUNTER — Encounter: Payer: Self-pay | Admitting: Cardiology

## 2017-10-11 ENCOUNTER — Encounter

## 2017-10-11 ENCOUNTER — Ambulatory Visit (INDEPENDENT_AMBULATORY_CARE_PROVIDER_SITE_OTHER): Payer: Medicare Other | Admitting: Cardiology

## 2017-10-11 VITALS — BP 160/98 | HR 61 | Ht 64.0 in | Wt 137.0 lb

## 2017-10-11 DIAGNOSIS — R011 Cardiac murmur, unspecified: Secondary | ICD-10-CM

## 2017-10-11 DIAGNOSIS — I480 Paroxysmal atrial fibrillation: Secondary | ICD-10-CM | POA: Diagnosis not present

## 2017-10-11 DIAGNOSIS — I421 Obstructive hypertrophic cardiomyopathy: Secondary | ICD-10-CM | POA: Diagnosis not present

## 2017-10-11 DIAGNOSIS — I2510 Atherosclerotic heart disease of native coronary artery without angina pectoris: Secondary | ICD-10-CM

## 2017-10-11 DIAGNOSIS — I10 Essential (primary) hypertension: Secondary | ICD-10-CM

## 2017-10-11 DIAGNOSIS — E7800 Pure hypercholesterolemia, unspecified: Secondary | ICD-10-CM | POA: Diagnosis not present

## 2017-10-11 MED ORDER — AMLODIPINE BESYLATE 5 MG PO TABS: 5.0000 mg | ORAL_TABLET | Freq: Every day | ORAL | 3 refills | Status: DC

## 2017-10-11 MED ORDER — NITROGLYCERIN 0.4 MG SL SUBL: 0.4000 mg | SUBLINGUAL_TABLET | SUBLINGUAL | 12 refills | Status: DC | PRN

## 2017-10-11 NOTE — Patient Instructions (Signed)
Medication Instructions:   INCREASE AMLODIPINE 5 MG ONCE DAILY= 2 OF THE 2.5 MG ONCE DAILY  Labwork:  Your physician recommends that you HAVE LAB WORK TODAY  Testing/Procedures:  Your physician has requested that you have an echocardiogram. Echocardiography is a painless test that uses sound waves to create images of your heart. It provides your doctor with information about the size and shape of your heart and how well your heart's chambers and valves are working. This procedure takes approximately one hour. There are no restrictions for this procedure.AT THE [place] [facility]    Follow-Up:  Your physician wants you to follow-up in: ONE YEAR WITH DR Shelda PalRENSHAW You will receive a reminder letter in the mail two months in advance. If you don't receive a letter, please call our office to schedule the follow-up appointment.   If you need a refill on your cardiac medications before your next appointment, please call your pharmacy.

## 2017-10-12 LAB — HEPATIC FUNCTION PANEL
ALK PHOS: 51 IU/L (ref 39–117)
ALT: 12 IU/L (ref 0–32)
AST: 17 IU/L (ref 0–40)
Albumin: 4.2 g/dL (ref 3.5–4.8)
Bilirubin Total: 0.4 mg/dL (ref 0.0–1.2)
Bilirubin, Direct: 0.11 mg/dL (ref 0.00–0.40)
Total Protein: 6.6 g/dL (ref 6.0–8.5)

## 2017-10-12 LAB — LIPID PANEL
CHOLESTEROL TOTAL: 185 mg/dL (ref 100–199)
Chol/HDL Ratio: 4.1 ratio (ref 0.0–4.4)
HDL: 45 mg/dL (ref >39)
LDL CALC: 117 mg/dL — AB (ref 0–99)
Triglycerides: 115 mg/dL (ref 0–149)
VLDL CHOLESTEROL CAL: 23 mg/dL (ref 5–40)

## 2017-10-17 ENCOUNTER — Encounter: Payer: Self-pay | Admitting: Nurse Practitioner

## 2017-10-19 ENCOUNTER — Ambulatory Visit (HOSPITAL_BASED_OUTPATIENT_CLINIC_OR_DEPARTMENT_OTHER): Payer: Medicare Other

## 2017-10-23 ENCOUNTER — Ambulatory Visit (HOSPITAL_BASED_OUTPATIENT_CLINIC_OR_DEPARTMENT_OTHER): Admission: RE | Admit: 2017-10-23 | Payer: Medicare Other | Source: Ambulatory Visit

## 2017-11-01 ENCOUNTER — Telehealth: Payer: Self-pay | Admitting: Cardiology

## 2017-11-01 ENCOUNTER — Ambulatory Visit (INDEPENDENT_AMBULATORY_CARE_PROVIDER_SITE_OTHER): Payer: Medicare Other | Admitting: *Deleted

## 2017-11-01 DIAGNOSIS — R55 Syncope and collapse: Secondary | ICD-10-CM | POA: Diagnosis not present

## 2017-11-01 DIAGNOSIS — I442 Atrioventricular block, complete: Secondary | ICD-10-CM

## 2017-11-01 NOTE — Telephone Encounter (Signed)
Spoke with pt and reminded pt of remote transmission that is due today. Pt verbalized understanding.   

## 2017-11-02 ENCOUNTER — Ambulatory Visit (HOSPITAL_BASED_OUTPATIENT_CLINIC_OR_DEPARTMENT_OTHER)
Admission: RE | Admit: 2017-11-02 | Discharge: 2017-11-02 | Disposition: A | Discharge status: Home / Self Care | Payer: Medicare Other | Source: Ambulatory Visit | Attending: Cardiology | Admitting: Cardiology

## 2017-11-02 DIAGNOSIS — R011 Cardiac murmur, unspecified: Secondary | ICD-10-CM | POA: Insufficient documentation

## 2017-11-02 DIAGNOSIS — I2510 Atherosclerotic heart disease of native coronary artery without angina pectoris: Secondary | ICD-10-CM | POA: Diagnosis not present

## 2017-11-02 DIAGNOSIS — I081 Rheumatic disorders of both mitral and tricuspid valves: Secondary | ICD-10-CM | POA: Diagnosis not present

## 2017-11-02 DIAGNOSIS — I119 Hypertensive heart disease without heart failure: Secondary | ICD-10-CM | POA: Diagnosis not present

## 2017-11-02 DIAGNOSIS — E785 Hyperlipidemia, unspecified: Secondary | ICD-10-CM | POA: Diagnosis not present

## 2017-11-02 NOTE — Progress Notes (Signed)
  Echocardiogram 2D Echocardiogram has been performed.  Nicole Watkins T Yukiko Minnich 11/02/2017, 3:48 PM

## 2017-11-02 NOTE — Progress Notes (Signed)
Remote pacemaker transmission.   

## 2017-11-06 NOTE — Progress Notes (Deleted)
Electrophysiology Office Note Date: 11/06/2017  ID:  Nicole Watkins, DOB [birth year], MRN [id number]  PCP: [name], Grayling Congress, DO Primary Cardiologist: [name] Electrophysiologist: [name]  CC: Pacemaker follow-up  Nicole Watkins is a 77 y.o. female seen today for [name].  She presents today for routine electrophysiology followup.  Since last being seen in our clinic, the patient reports doing very well.  She denies chest pain, palpitations, dyspnea, PND, orthopnea, nausea, vomiting, dizziness, syncope, edema, weight gain, or early satiety.  Device History: MDT dual chamber PPM implanted 2017 for complete heart block (identified on ILR)   Past Medical History:  Diagnosis Date  . Anxiety   . Arthritis   . CAD (coronary artery disease)   . CHB (complete heart block) (HCC)    a. s/p PPM on 09/29/15  . GERD (gastroesophageal reflux disease)   . Hyperlipidemia   . Hypertension   . LBBB (left bundle branch block)   . Status post placement of cardiac pacemaker    a. 09/2015: Medtronic Advisa MRI model [id number] (serial number [id number]) pacemaker  . Thyroid-related proptosis    Past Surgical History:  Procedure Laterality Date  . CARDIAC SURGERY     stent placed 03/04/13  . EP IMPLANTABLE DEVICE N/A 06/17/2015   Procedure: Loop Recorder Insertion;  Surgeon: [name], MD;  Location: [facility]VAS[facility] CV LAB;  Service: Cardiovascular;  Laterality: N/A;  . EP IMPLANTABLE DEVICE N/A 09/29/2015   Procedure: Pacemaker Implant;  Surgeon: [name], MD;  Location: [facility] INVASIVE CV LAB;  Service: Cardiovascular;  Laterality: N/A;  . No family history      Current Outpatient Medications  Medication Sig Dispense Refill  . ALPRAZolam (XANAX) 0.25 MG tablet Take 1 tablet (0.25 mg total) by mouth 2 (two) times daily as needed for anxiety. 60 tablet 1  . amLODipine (NORVASC) 5 MG tablet Take 1 tablet (5 mg total) by mouth daily. 90 tablet 3  . aspirin 81 MG tablet Take 81 mg by mouth every  evening. Reported on 08/10/2015    . Cholecalciferol 4000 UNITS CAPS Take 4,000 Units by mouth every evening. Reported on 08/10/2015    . docusate sodium (COLACE) 100 MG capsule Take 100 mg by mouth 2 (two) times daily. Reported on 08/10/2015    . lisinopril (PRINIVIL,ZESTRIL) 20 MG tablet TAKE ONE TABLET TWO TIMES A DAY 180 tablet 3  . metoprolol tartrate (LOPRESSOR) 50 MG tablet Take 1/2 tab by mouth in the am and 1 tab at night    . nitroGLYCERIN (NITROSTAT) 0.4 MG SL tablet Place 1 tablet (0.4 mg total) under the tongue every 5 (five) minutes as needed for chest pain. Reported on 08/10/2015 25 tablet 12  . pravastatin (PRAVACHOL) 40 MG tablet Take 1 tablet (40 mg total) by mouth every evening. 90 tablet 3  . pravastatin (PRAVACHOL) 40 MG tablet TAKE ONE TABLET BY MOUTH EVERY EVENING 90 tablet 0   No current facility-administered medications for this visit.     Allergies:   Lipitor [atorvastatin]; Penicillins; Ambien [zolpidem tartrate]; and Crestor [rosuvastatin calcium]   Social History: Social History   Socioeconomic History  . Marital status: Widowed    Spouse name: Not on file  . Number of children: 1  . Years of education: Not on file  . Highest education level: Not on file  Occupational History  . Not on file  Social Needs  . Financial resource strain: Not on file  . Food insecurity:    Worry: Not  on file    Inability: Not on file  . Transportation needs:    Medical: Not on file    Non-medical: Not on file  Tobacco Use  . Smoking status: Former Games developer  . Smokeless tobacco: Never Used  Substance and Sexual Activity  . Alcohol use: No    Alcohol/week: 0.0 standard drinks  . Drug use: No  . Sexual activity: Not on file  Lifestyle  . Physical activity:    Days per week: Not on file    Minutes per session: Not on file  . Stress: Not on file  Relationships  . Social connections:    Talks on phone: Not on file    Gets together: Not on file    Attends religious  service: Not on file    Active member of club or organization: Not on file    Attends meetings of clubs or organizations: Not on file    Relationship status: Not on file  . Intimate partner violence:    Fear of current or ex partner: Not on file    Emotionally abused: Not on file    Physically abused: Not on file    Forced sexual activity: Not on file  Other Topics Concern  . Not on file  Social History Narrative  . Not on file    Family History: Family History  Problem Relation Age of Onset  . Arthritis Mother   . Hyperlipidemia Mother   . Hypertension Mother   . Hypothyroidism Mother   . Uterine cancer Maternal Grandmother      Review of Systems: All other systems reviewed and are otherwise negative except as noted above.   Physical Exam: VS:  There were no vitals taken for this visit. , BMI There is no height or weight on file to calculate BMI.  GEN- The patient is well appearing, alert and oriented x 3 today.   HEENT: normocephalic, atraumatic; sclera clear, conjunctiva pink; hearing intact; oropharynx clear; neck supple  Lungs- Clear to ausculation bilaterally, normal work of breathing.  No wheezes, rales, rhonchi Heart- Regular rate and rhythm, no murmurs, rubs or gallops  GI- soft, non-tender, non-distended, bowel sounds present  Extremities- no clubbing, cyanosis, or edema  MS- no significant deformity or atrophy Skin- warm and dry, no rash or lesion; PPM pocket well healed Psych- euthymic mood, full affect Neuro- strength and sensation are intact  PPM Interrogation- reviewed in detail today,  See PACEART report  EKG:  EKG is not ordered today.  Recent Labs: 08/11/2017: BUN 25; Creat 1.14; Hemoglobin 14.0; Platelets 314; Potassium 4.5; Sodium 141 10/11/2017: ALT 12   Wt Readings from Last 3 Encounters:  10/11/17 137 lb (62.1 kg)  08/11/17 143 lb (64.9 kg)  08/11/17 143 lb 9.6 oz (65.1 kg)     Other studies Reviewed: Additional studies/ records that  were reviewed today include: [name] and Dr [name] office notes   Assessment and Plan:  1.  Transient complete heart block Normal PPM function See Pace Art report No changes today  2.  HTN Stable No change required today  3. HCM Stable No change required today Echo 10/2017 reviewed   Current medicines are reviewed at length with the patient today.   The patient does not have concerns regarding her medicines.  The following changes were made today:  none  Labs/ tests ordered today include: none No orders of the defined types were placed in this encounter.    Disposition:   Follow up with  [facility], [name] 1 year, Dr [name] as scheduled    Signed, Gypsy Balsam, NP 11/06/2017 11:26 AM  [facility] [address] [place] [address] [phone] (office) [phone] (fax)

## 2017-11-09 ENCOUNTER — Encounter: Payer: Medicare Other | Admitting: Nurse Practitioner

## 2017-11-23 LAB — CUP PACEART REMOTE DEVICE CHECK
Battery Remaining Longevity: 77 mo
Brady Statistic AS VP Percent: 37.62 %
Brady Statistic RA Percent Paced: 57.13 %
Date Time Interrogation Session: 20190904210948
Implantable Lead Implant Date: 20170801
Implantable Lead Location: 753859
Implantable Lead Location: 753860
Implantable Lead Model: 5076
Implantable Pulse Generator Implant Date: 20170801
Lead Channel Impedance Value: 361 Ohm
Lead Channel Impedance Value: 551 Ohm
Lead Channel Pacing Threshold Pulse Width: 0.4 ms
Lead Channel Pacing Threshold Pulse Width: 0.4 ms
Lead Channel Sensing Intrinsic Amplitude: 1.875 mV
Lead Channel Sensing Intrinsic Amplitude: 1.875 mV
Lead Channel Sensing Intrinsic Amplitude: 28.75 mV
Lead Channel Setting Pacing Pulse Width: 0.4 ms
Lead Channel Setting Sensing Sensitivity: 2.8 mV
MDC IDC LEAD IMPLANT DT: 20170801
MDC IDC MSMT BATTERY VOLTAGE: 3 V
MDC IDC MSMT LEADCHNL RA IMPEDANCE VALUE: 437 Ohm
MDC IDC MSMT LEADCHNL RA PACING THRESHOLD AMPLITUDE: 0.5 V
MDC IDC MSMT LEADCHNL RV IMPEDANCE VALUE: 475 Ohm
MDC IDC MSMT LEADCHNL RV PACING THRESHOLD AMPLITUDE: 0.875 V
MDC IDC MSMT LEADCHNL RV SENSING INTR AMPL: 28.75 mV
MDC IDC SET LEADCHNL RA PACING AMPLITUDE: 2 V
MDC IDC SET LEADCHNL RV PACING AMPLITUDE: 2.5 V
MDC IDC STAT BRADY AP VP PERCENT: 62.29 %
MDC IDC STAT BRADY AP VS PERCENT: 0.01 %
MDC IDC STAT BRADY AS VS PERCENT: 0.08 %
MDC IDC STAT BRADY RV PERCENT PACED: 99.9 %

## 2017-12-06 ENCOUNTER — Other Ambulatory Visit: Payer: Self-pay | Admitting: Cardiology

## 2017-12-13 DIAGNOSIS — H2513 Age-related nuclear cataract, bilateral: Secondary | ICD-10-CM | POA: Diagnosis not present

## 2017-12-13 DIAGNOSIS — H40013 Open angle with borderline findings, low risk, bilateral: Secondary | ICD-10-CM | POA: Diagnosis not present

## 2017-12-20 NOTE — Progress Notes (Signed)
Electrophysiology Office Note Date: 12/22/2017  ID:  Nicole Watkins, Nicole Watkins [birth year], MRN [id number]  PCP: [name], Grayling Congress, DO Primary Cardiologist: [name] Electrophysiologist: [name]  CC: Pacemaker follow-up  Nicole Watkins is a 77 y.o. female seen today for [name].  She presents today for routine electrophysiology followup.  Since last being seen in our clinic, the patient reports doing relatively well.  She has a chronic non productive cough that her daughter is concerned could be from Lisinopril. She has recently moved to [place] (between [place] and [place]) and is looking to establish with a new PCP.  Her blood pressures are labile at home.   She denies chest pain, palpitations, dyspnea, PND, orthopnea, nausea, vomiting, dizziness, syncope, edema, weight gain, or early satiety.  Device History: MDT dual chamber PPM implanted 2017 for complete heart block   Past Medical History:  Diagnosis Date  . Anxiety   . Arthritis   . CAD (coronary artery disease)   . CHB (complete heart block) (HCC)    a. s/p PPM on 09/29/15  . GERD (gastroesophageal reflux disease)   . Hyperlipidemia   . Hypertension   . LBBB (left bundle branch block)   . Status post placement of cardiac pacemaker    a. 09/2015: Medtronic Advisa MRI model [id number] (serial number [id number]) pacemaker  . Thyroid-related proptosis    Past Surgical History:  Procedure Laterality Date  . CARDIAC SURGERY     stent placed 03/04/13  . EP IMPLANTABLE DEVICE N/A 06/17/2015   Procedure: Loop Recorder Insertion;  Surgeon: [name], MD;  [facility] INVASIVE CV LAB;  Service: Cardiovascular;  Laterality: N/A;  . EP IMPLANTABLE DEVICE N/A 09/29/2015   Procedure: Pacemaker Implant;  Surgeon: [name], MD;  Location: [facility] INVASIVE CV LAB;  Service: Cardiovascular;  Laterality: N/A;  . No family history      Current Outpatient Medications  Medication Sig Dispense Refill  . ALPRAZolam (XANAX) 0.25 MG tablet Take  1 tablet (0.25 mg total) by mouth 2 (two) times daily as needed for anxiety. 60 tablet 1  . amLODipine (NORVASC) 5 MG tablet Take 1 tablet (5 mg total) by mouth daily. 90 tablet 3  . aspirin 81 MG tablet Take 81 mg by mouth every evening. Reported on 08/10/2015    . Cholecalciferol 4000 UNITS CAPS Take 4,000 Units by mouth every evening. Reported on 08/10/2015    . docusate sodium (COLACE) 100 MG capsule Take 100 mg by mouth 2 (two) times daily. Reported on 08/10/2015    . metoprolol tartrate (LOPRESSOR) 50 MG tablet TAKE ONE TABLET BY MOUTH TWICE A DAY 180 tablet 2  . nitroGLYCERIN (NITROSTAT) 0.4 MG SL tablet Place 1 tablet (0.4 mg total) under the tongue every 5 (five) minutes as needed for chest pain. Reported on 08/10/2015 25 tablet 12  . pravastatin (PRAVACHOL) 40 MG tablet TAKE ONE TABLET BY MOUTH EVERY EVENING 90 tablet 0  . losartan (COZAAR) 50 MG tablet Take 1 tablet (50 mg total) by mouth daily. 90 tablet 3  . pravastatin (PRAVACHOL) 40 MG tablet Take 1 tablet (40 mg total) by mouth every evening. 90 tablet 3   No current facility-administered medications for this visit.     Allergies:   Lipitor [atorvastatin]; Penicillins; Ambien [zolpidem tartrate]; and Crestor [rosuvastatin calcium]   Social History: Social History   Socioeconomic History  . Marital status: Widowed    Spouse name: Not on file  . Number of children: 1  . Years of  education: Not on file  . Highest education level: Not on file  Occupational History  . Not on file  Social Needs  . Financial resource strain: Not on file  . Food insecurity:    Worry: Not on file    Inability: Not on file  . Transportation needs:    Medical: Not on file    Non-medical: Not on file  Tobacco Use  . Smoking status: Former Games developer  . Smokeless tobacco: Never Used  Substance and Sexual Activity  . Alcohol use: No    Alcohol/week: 0.0 standard drinks  . Drug use: No  . Sexual activity: Not on file  Lifestyle  . Physical  activity:    Days per week: Not on file    Minutes per session: Not on file  . Stress: Not on file  Relationships  . Social connections:    Talks on phone: Not on file    Gets together: Not on file    Attends religious service: Not on file    Active member of club or organization: Not on file    Attends meetings of clubs or organizations: Not on file    Relationship status: Not on file  . Intimate partner violence:    Fear of current or ex partner: Not on file    Emotionally abused: Not on file    Physically abused: Not on file    Forced sexual activity: Not on file  Other Topics Concern  . Not on file  Social History Narrative  . Not on file    Family History: Family History  Problem Relation Age of Onset  . Arthritis Mother   . Hyperlipidemia Mother   . Hypertension Mother   . Hypothyroidism Mother   . Uterine cancer Maternal Grandmother      Review of Systems: All other systems reviewed and are otherwise negative except as noted above.   Physical Exam: VS:  BP (!) 160/92   Pulse (!) 59   Ht 5\' 4"  (1.626 m)   Wt 133 lb 12.8 oz (60.7 kg)   SpO2 94%   BMI 22.97 kg/m  , BMI Body mass index is 22.97 kg/m.  GEN- The patient is elderly appearing, alert and oriented x 3 today.   HEENT: normocephalic, atraumatic; sclera clear, conjunctiva pink; hearing intact; oropharynx clear; neck supple  Lungs- Clear to ausculation bilaterally, normal work of breathing.  No wheezes, rales, rhonchi Heart- Regular rate and rhythm   GI- soft, non-tender, non-distended, bowel sounds present  Extremities- no clubbing, cyanosis, or edema  MS- no significant deformity or atrophy Skin- warm and dry, no rash or lesion; PPM pocket well healed Psych- euthymic mood, full affect Neuro- strength and sensation are intact  PPM Interrogation- reviewed in detail today,  See PACEART report  EKG:  EKG is not ordered today.  Recent Labs: 08/11/2017: BUN 25; Creat 1.14; Hemoglobin 14.0;  Platelets 314; Potassium 4.5; Sodium 141 10/11/2017: ALT 12   Wt Readings from Last 3 Encounters:  12/22/17 133 lb 12.8 oz (60.7 kg)  10/11/17 137 lb (62.1 kg)  08/11/17 143 lb (64.9 kg)     Other studies Reviewed: Additional studies/ records that were reviewed today include: Dr [name] and Dr Jenel Lucks office notes   Assessment and Plan:  1.  Transient complete heart block Normal PPM function See Pace Art report No changes today  2.  HTN BP's labile at home. Daughter is concerned non-productive cough could be from lisinopril. Will stop Lisinopril and start  Losartan 50mg  daily BMET today She will call next week with blood pressure log. If BP elevated at that time, will increase Amlodipine to 10mg  daily  3.  HOCM Followed by Dr [name] Echo 10/2017 stable  4.  CAD No recent ischemic symptoms Continue current therapy    Current medicines are reviewed at length with the patient today.   The patient does not have concerns regarding her medicines.  The following changes were made today:  Stop Lisinopril, start Losartan 50mg  daily  Labs/ tests ordered today include: BMET Orders Placed This Encounter  Procedures  . Basic metabolic panel  . CUP PACEART INCLINIC DEVICE CHECK     Disposition:   Follow up with Dr [name] as scheduled, [facility], me or Renee in 4 weeks for BP follow up (until established with new PCP)   Signed, Gypsy Balsam, NP 12/22/2017 12:41 PM  [facility] [address] [place] [address] [phone] (office) [phone] (fax)

## 2017-12-22 ENCOUNTER — Encounter: Payer: Self-pay | Admitting: Nurse Practitioner

## 2017-12-22 ENCOUNTER — Ambulatory Visit (INDEPENDENT_AMBULATORY_CARE_PROVIDER_SITE_OTHER): Payer: Medicare Other | Admitting: Nurse Practitioner

## 2017-12-22 ENCOUNTER — Telehealth: Payer: Self-pay

## 2017-12-22 VITALS — BP 160/92 | HR 59 | Ht 64.0 in | Wt 133.8 lb

## 2017-12-22 DIAGNOSIS — I421 Obstructive hypertrophic cardiomyopathy: Secondary | ICD-10-CM | POA: Diagnosis not present

## 2017-12-22 DIAGNOSIS — I10 Essential (primary) hypertension: Secondary | ICD-10-CM | POA: Diagnosis not present

## 2017-12-22 DIAGNOSIS — I442 Atrioventricular block, complete: Secondary | ICD-10-CM | POA: Diagnosis not present

## 2017-12-22 DIAGNOSIS — I2510 Atherosclerotic heart disease of native coronary artery without angina pectoris: Secondary | ICD-10-CM

## 2017-12-22 LAB — CUP PACEART INCLINIC DEVICE CHECK
Date Time Interrogation Session: 20191025121501
Implantable Lead Implant Date: 20170801
Implantable Lead Location: 753859
Implantable Lead Location: 753860
Implantable Lead Model: 5076
MDC IDC LEAD IMPLANT DT: 20170801
MDC IDC PG IMPLANT DT: 20170801

## 2017-12-22 MED ORDER — LOSARTAN POTASSIUM 50 MG PO TABS: 50.0000 mg | ORAL_TABLET | Freq: Every day | ORAL | 3 refills | Status: DC

## 2017-12-22 NOTE — Patient Instructions (Signed)
Medication Instructions:  STOP LISINOPRIL  START LOSARTAN 50mg  daily If you need a refill on your cardiac medications before your next appointment, please call your pharmacy.   Lab work:TODAY BMET  If you have labs (blood work) drawn today and your tests are completely normal, you will receive your results only by: Marland Kitchen MyChart Message (if you have MyChart) OR . A paper copy in the mail If you have any lab test that is abnormal or we need to change your treatment, we will call you to review the results.  Testing/Procedures: NONE  Follow-Up: At [facility], you and your health needs are our priority.  As part of our continuing mission to provide you with exceptional heart care, we have created designated Provider Care Teams.  These Care Teams include your primary Cardiologist (physician) and Advanced Practice Providers (APPs -  Physician Assistants and Nurse Practitioners) who all work together to provide you with the care you need, when you need it. You will need a follow up appointment in 4 weeks.  Please call our office 2 months in advance to schedule this appointment.  You may see Francis Dowse or one of the following Advanced Practice Providers on your designated Care Team:   Gypsy Balsam, NP . Francis Dowse, PA-C  Any Other Special Instructions Will Be Listed Below (If Applicable).  Call us next week Casimiro Needle) [phone] with BP readings.  If constant in the 150s systolic (top number) we will double on your Amlodipine.

## 2017-12-22 NOTE — Telephone Encounter (Signed)
Spoke to patient and scheduled a 12/20 OV with Renee.  She verbalized understanding.

## 2017-12-22 NOTE — Telephone Encounter (Signed)
Patient was checking out after OV and she informed us that she can only be seen on Fridays (transportation) and we were trying to schedule a 4 week f/u with Renee.  Unfortunately, between you, [name] and Luster Landsberg there is not a Friday available until late December.  Should we just bring her in for a Nurse visit in 4 weeks for a BP check, and f/u later?  Thank you

## 2017-12-22 NOTE — Telephone Encounter (Signed)
[email or web address] 10/25

## 2017-12-23 LAB — BASIC METABOLIC PANEL
BUN / CREAT RATIO: 17 (ref 12–28)
BUN: 18 mg/dL (ref 8–27)
CHLORIDE: 103 mmol/L (ref 96–106)
CO2: 25 mmol/L (ref 20–29)
Calcium: 9.7 mg/dL (ref 8.7–10.3)
Creatinine, Ser: 1.05 mg/dL — ABNORMAL HIGH (ref 0.57–1.00)
GFR calc Af Amer: 59 mL/min/{1.73_m2} — ABNORMAL LOW (ref >59)
GFR calc non Af Amer: 51 mL/min/{1.73_m2} — ABNORMAL LOW (ref >59)
GLUCOSE: 88 mg/dL (ref 65–99)
Potassium: 4.5 mmol/L (ref 3.5–5.2)
Sodium: 143 mmol/L (ref 134–144)

## 2017-12-25 ENCOUNTER — Telehealth: Payer: Self-pay

## 2017-12-25 ENCOUNTER — Telehealth: Payer: Self-pay | Admitting: Internal Medicine

## 2017-12-25 NOTE — Telephone Encounter (Signed)
Notes recorded by Sigurd Sos, RN on 12/25/2017 at 8:08 AM EDT lpmtcb 10/28

## 2017-12-25 NOTE — Telephone Encounter (Signed)
Notes recorded by Sigurd Sos, RN on 12/25/2017 at 9:19 AM EDT The patient has been notified of the result and verbalized understanding. All questions (if any) were answered. Sigurd Sos, RN 12/25/2017 9:19 AM

## 2017-12-25 NOTE — Telephone Encounter (Signed)
-----   Message from Amber K Seiler, NP sent at 12/23/2017  8:31 AM EDT ----- All values are normal or within acceptable limits.   Medication changes / Follow up labs / Other changes or recommendations:   none Amber Seiler, NP 12/23/2017 8:30 AM   

## 2017-12-25 NOTE — Telephone Encounter (Signed)
-----   Message from Marily Lente, NP sent at 12/23/2017  8:31 AM EDT ----- All values are normal or within acceptable limits.   Medication changes / Follow up labs / Other changes or recommendations:   none Gypsy Balsam, NP 12/23/2017 8:30 AM

## 2017-12-25 NOTE — Telephone Encounter (Signed)
New Message ° ° ° ° ° ° ° ° ° °Patient returned your call, would like a call back. °

## 2017-12-28 DIAGNOSIS — H40013 Open angle with borderline findings, low risk, bilateral: Secondary | ICD-10-CM | POA: Diagnosis not present

## 2018-01-10 ENCOUNTER — Telehealth: Payer: Self-pay | Admitting: Nurse Practitioner

## 2018-01-10 NOTE — Telephone Encounter (Signed)
Pt called to give her BP readings to Gypsy BalsamAmber Seiler NP after stopping her Lisinopril due to dry cough and starting Cozaar 50mg  a day starting 12/22/17... She says that she has been feeling well...  01/02/18..........137/84 01/04/18...........144/83 01/05/18............147/91 01/06/18............133/83 01/07/18..........150/84 01/08/18...........127/82 01/09/18...........144/83 01/10/18...........134/85

## 2018-01-10 NOTE — Telephone Encounter (Signed)
New messgae:   Patient calling about her BP numbers,  01/02/18 BP 137/84,01/04/18 144/83, 01/05/18 147/91, 01/06/18 133/83, 01/07/18 150/84, 01/08/18 127/82, 01/09/18 144/83, 01/10/18 134/85

## 2018-01-13 NOTE — Telephone Encounter (Signed)
Would keep meds as is for now.   Gypsy BalsamAmber Anaya Bovee, NP 01/13/2018 4:11 PM

## 2018-01-31 ENCOUNTER — Ambulatory Visit (INDEPENDENT_AMBULATORY_CARE_PROVIDER_SITE_OTHER): Payer: Medicare Other

## 2018-01-31 ENCOUNTER — Telehealth: Payer: Self-pay

## 2018-01-31 DIAGNOSIS — I442 Atrioventricular block, complete: Secondary | ICD-10-CM | POA: Diagnosis not present

## 2018-01-31 NOTE — Telephone Encounter (Signed)
Spoke with pt and reminded pt of remote transmission that is due today. Pt verbalized understanding.   

## 2018-01-31 NOTE — Progress Notes (Signed)
Remote pacemaker transmission.   

## 2018-02-06 ENCOUNTER — Encounter: Payer: Self-pay | Admitting: Cardiology

## 2018-02-06 NOTE — Progress Notes (Signed)
Letter  

## 2018-02-12 ENCOUNTER — Ambulatory Visit: Payer: Medicare Other | Admitting: Family Medicine

## 2018-02-15 DIAGNOSIS — F411 Generalized anxiety disorder: Secondary | ICD-10-CM | POA: Diagnosis not present

## 2018-02-15 DIAGNOSIS — E079 Disorder of thyroid, unspecified: Secondary | ICD-10-CM | POA: Diagnosis not present

## 2018-02-15 DIAGNOSIS — Z950 Presence of cardiac pacemaker: Secondary | ICD-10-CM | POA: Diagnosis not present

## 2018-02-15 DIAGNOSIS — I10 Essential (primary) hypertension: Secondary | ICD-10-CM | POA: Diagnosis not present

## 2018-02-15 DIAGNOSIS — I421 Obstructive hypertrophic cardiomyopathy: Secondary | ICD-10-CM | POA: Diagnosis not present

## 2018-02-15 DIAGNOSIS — Z5181 Encounter for therapeutic drug level monitoring: Secondary | ICD-10-CM | POA: Diagnosis not present

## 2018-02-15 DIAGNOSIS — Z8349 Family history of other endocrine, nutritional and metabolic diseases: Secondary | ICD-10-CM | POA: Diagnosis not present

## 2018-02-15 DIAGNOSIS — R0989 Other specified symptoms and signs involving the circulatory and respiratory systems: Secondary | ICD-10-CM | POA: Diagnosis not present

## 2018-02-15 DIAGNOSIS — E559 Vitamin D deficiency, unspecified: Secondary | ICD-10-CM | POA: Diagnosis not present

## 2018-02-15 DIAGNOSIS — I714 Abdominal aortic aneurysm, without rupture: Secondary | ICD-10-CM | POA: Diagnosis not present

## 2018-02-15 DIAGNOSIS — I429 Cardiomyopathy, unspecified: Secondary | ICD-10-CM | POA: Diagnosis not present

## 2018-02-15 DIAGNOSIS — I447 Left bundle-branch block, unspecified: Secondary | ICD-10-CM | POA: Diagnosis not present

## 2018-02-16 ENCOUNTER — Ambulatory Visit: Payer: Medicare Other | Admitting: Physician Assistant

## 2018-03-04 LAB — CUP PACEART REMOTE DEVICE CHECK
Brady Statistic AP VS Percent: 0 %
Brady Statistic AS VP Percent: 100 %
Brady Statistic AS VS Percent: 0 %
Brady Statistic RA Percent Paced: 0 %
Implantable Lead Implant Date: 20170801
Implantable Lead Location: 753859
Implantable Lead Location: 753860
Implantable Lead Model: 5076
Implantable Lead Model: 5076
Lead Channel Impedance Value: 361 Ohm
Lead Channel Impedance Value: 551 Ohm
Lead Channel Pacing Threshold Pulse Width: 0.4 ms
Lead Channel Sensing Intrinsic Amplitude: 2.25 mV
Lead Channel Sensing Intrinsic Amplitude: 4.125 mV
Lead Channel Sensing Intrinsic Amplitude: 4.125 mV
Lead Channel Setting Pacing Amplitude: 2.5 V
Lead Channel Setting Pacing Pulse Width: 0.4 ms
MDC IDC LEAD IMPLANT DT: 20170801
MDC IDC MSMT BATTERY REMAINING LONGEVITY: 80 mo
MDC IDC MSMT BATTERY VOLTAGE: 3.01 V
MDC IDC MSMT LEADCHNL RA IMPEDANCE VALUE: 437 Ohm
MDC IDC MSMT LEADCHNL RA PACING THRESHOLD AMPLITUDE: 0.625 V
MDC IDC MSMT LEADCHNL RA PACING THRESHOLD PULSEWIDTH: 0.4 ms
MDC IDC MSMT LEADCHNL RA SENSING INTR AMPL: 2.25 mV
MDC IDC MSMT LEADCHNL RV IMPEDANCE VALUE: 456 Ohm
MDC IDC MSMT LEADCHNL RV PACING THRESHOLD AMPLITUDE: 0.75 V
MDC IDC PG IMPLANT DT: 20170801
MDC IDC SESS DTM: 20191204163100
MDC IDC SET LEADCHNL RA PACING AMPLITUDE: 2 V
MDC IDC SET LEADCHNL RV SENSING SENSITIVITY: 2.8 mV
MDC IDC STAT BRADY AP VP PERCENT: 0 %
MDC IDC STAT BRADY RV PERCENT PACED: 100 %

## 2018-03-04 NOTE — Progress Notes (Signed)
Cardiology Office Note Date:  03/06/2018  Patient ID:  Nicole, Watkins 01/08/1941, MRN [id number] PCP:  Ardell Isaacs, NP  Cardiologist:  Dr. [name] Electrophysiologist: Dr. Johney Frame   Chief Complaint: planned f/u on HTN  History of Present Illness: Nicole Watkins is a 78 y.o. female with history of CAD (STEMI jan 2015 w/ DES to Cx), LBBB, SVT, CHB w/PPM, anxiety (follows with pshychiatry), HOCM.  She comes in today to be seen for Dr. Johney Frame.  She had c/o a persistent cough, her lisinopril changed to losartan, with plans to up-titrate amlodipine next if needed.  She was in-between PMD at the time with a recent move.  Since last being seen in clinic, she reports doing very well.  Blood pressure is significantly improved. She denies chest pain, palpitations, dyspnea, dizziness, syncope. She has established with new PCP who she likes very much and is close to home.   Device information: MDT dual chamber PPM, implanted 2017, CHB (complicated by PTX requiring chest tube) Prior ILR was removed   Past Medical History:  Diagnosis Date  . Anxiety   . Arthritis   . CAD (coronary artery disease)   . CHB (complete heart block) (HCC)    a. s/p PPM on 09/29/15  . GERD (gastroesophageal reflux disease)   . Hyperlipidemia   . Hypertension   . LBBB (left bundle branch block)   . Status post placement of cardiac pacemaker    a. 09/2015: Medtronic Advisa MRI model [id number] (serial number [id number]) pacemaker  . Thyroid-related proptosis     Past Surgical History:  Procedure Laterality Date  . CARDIAC SURGERY     stent placed 03/04/13  . EP IMPLANTABLE DEVICE N/A 06/17/2015   Procedure: Loop Recorder Insertion;  Surgeon: [name], MD;  Location: [facility] INVASIVE CV LAB;  Service: Cardiovascular;  Laterality: N/A;  . EP IMPLANTABLE DEVICE N/A 09/29/2015   Procedure: Pacemaker Implant;  Surgeon: [name], MD;  Location: [facility] INVASIVE CV LAB;  Service: Cardiovascular;  Laterality: N/A;  . No  family history      Current Outpatient Medications  Medication Sig Dispense Refill  . ALPRAZolam (XANAX) 0.25 MG tablet Take 1 tablet (0.25 mg total) by mouth 2 (two) times daily as needed for anxiety. 60 tablet 1  . aspirin 81 MG tablet Take 81 mg by mouth every evening. Reported on 08/10/2015    . Cholecalciferol 4000 UNITS CAPS Take 4,000 Units by mouth every evening. Reported on 08/10/2015    . docusate sodium (COLACE) 100 MG capsule Take 100 mg by mouth daily.    Marland Kitchen losartan (COZAAR) 50 MG tablet Take 1 tablet (50 mg total) by mouth daily. 90 tablet 3  . metoprolol tartrate (LOPRESSOR) 50 MG tablet TAKE ONE TABLET BY MOUTH TWICE A DAY 180 tablet 2  . nitroGLYCERIN (NITROSTAT) 0.4 MG SL tablet Place 1 tablet (0.4 mg total) under the tongue every 5 (five) minutes as needed for chest pain. Reported on 08/10/2015 25 tablet 12  . pravastatin (PRAVACHOL) 40 MG tablet TAKE ONE TABLET BY MOUTH EVERY EVENING 90 tablet 0  . amLODipine (NORVASC) 5 MG tablet Take 1 tablet (5 mg total) by mouth daily. 90 tablet 3  . pravastatin (PRAVACHOL) 40 MG tablet Take 1 tablet (40 mg total) by mouth every evening. 90 tablet 3   No current facility-administered medications for this visit.     Allergies:   Lipitor [atorvastatin]; Penicillins; Ambien [zolpidem tartrate]; and Crestor [rosuvastatin calcium]   Social History:  The  patient  reports that she has quit smoking. She has never used smokeless tobacco. She reports that she does not drink alcohol or use drugs.   Family History:  The patient's family history includes Arthritis in her mother; Hyperlipidemia in her mother; Hypertension in her mother; Hypothyroidism in her mother; Uterine cancer in her maternal grandmother.  ROS:  Please see the history of present illness.   All other systems are reviewed and otherwise negative.   PHYSICAL EXAM:  VS:  BP (!) 158/78   Pulse (!) 57   Ht 5\' 4"  (1.626 m)   Wt 139 lb 6.4 oz (63.2 kg)   SpO2 98%   BMI 23.93 kg/m   BMI: Body mass index is 23.93 kg/m. Well nourished, well developed, in no acute distress  HEENT: normocephalic, atraumatic  Neck: no JVD, carotid bruits or masses Cardiac:  RRR; no significant murmurs, no rubs, or gallops Lungs:  CTA b/l, no wheezing, rhonchi or rales  Abd: soft, nontender MS: no deformity or atrophy Ext: no edema  Skin: warm and dry, no rash Neuro:  No gross deficits appreciated Psych: euthymic mood, full affect  PPM site is stable, no tethering or discomfort   EKG:  Not done today  11/02/17: TTE Study Conclusions - Left ventricle: The cavity size was normal. Wall thickness was   increased in a pattern of moderate LVH. Systolic function was   normal. The estimated ejection fraction was in the range of 60%   to 65%. Wall motion was normal; there were no regional wall   motion abnormalities. - Mitral valve: Calcified annulus. - Left atrium: The atrium was moderately dilated. Impressions: - 1. Left ventricular systolic function is preserved visually   estimated ejection fraction is 60 to 65%. Severe focal   hypertrophy of the left ventricular septum is seen suggestive of   hypertrophic cardiomyopathy. Systolic anterior motion of the   mitral valve is seen. The gradient across the left ventricular   outflow tract is approximately 36 mmHg.   2. Moderately enlarged left atrium   3. At least mild mitral regurgitation with calcifications of the   mitral annulus evident.   4. Mild tricuspid regurgitation.  03/19/15: EST  Poor exercise capacity.  No chest pain.   Normal BP response to exercise.  LBBB  FU with Dr. Olga Millers as planned.   Recent Labs: 08/11/2017: Hemoglobin 14.0; Platelets 314 10/11/2017: ALT 12 12/22/2017: BUN 18; Creatinine, Ser 1.05; Potassium 4.5; Sodium 143  10/11/2017: Chol/HDL Ratio 4.1; Cholesterol, Total 185; HDL 45; LDL Calculated 117; Triglycerides 115   CrCl cannot be calculated (Patient's most recent lab result is older than  the maximum 21 days allowed.).   Wt Readings from Last 3 Encounters:  03/06/18 139 lb 6.4 oz (63.2 kg)  12/22/17 133 lb 12.8 oz (60.7 kg)  10/11/17 137 lb (62.1 kg)     Other studies reviewed: Additional studies/records reviewed today include: summarized above  ASSESSMENT AND PLAN:  1. Transient complete heart block Normal pacemaker function See PaceArt report No changes today   2. HTN Stable No change required today  3. HOCM Followed by Dr [name] Echo 10/2017 stable  4.  CAD No recent ischemic symptoms Continue current therapy   Disposition: F/u with [facility], Dr [name] as scheduled, [name] or me 1 year   Current medicines are reviewed at length with the patient today.  The patient did not have any concerns regarding medicines.  Signed, Gypsy Balsam, NP 03/06/2018 2:11 PM  [address] [place]  [place] [address] [phone] (office)  [phone] (fax)

## 2018-03-06 ENCOUNTER — Encounter: Payer: Self-pay | Admitting: Nurse Practitioner

## 2018-03-06 ENCOUNTER — Ambulatory Visit (INDEPENDENT_AMBULATORY_CARE_PROVIDER_SITE_OTHER): Payer: Medicare Other | Admitting: Nurse Practitioner

## 2018-03-06 VITALS — BP 158/78 | HR 57 | Ht 64.0 in | Wt 139.4 lb

## 2018-03-06 DIAGNOSIS — I421 Obstructive hypertrophic cardiomyopathy: Secondary | ICD-10-CM | POA: Diagnosis not present

## 2018-03-06 DIAGNOSIS — I2510 Atherosclerotic heart disease of native coronary artery without angina pectoris: Secondary | ICD-10-CM | POA: Diagnosis not present

## 2018-03-06 DIAGNOSIS — I442 Atrioventricular block, complete: Secondary | ICD-10-CM | POA: Diagnosis not present

## 2018-03-06 DIAGNOSIS — I10 Essential (primary) hypertension: Secondary | ICD-10-CM | POA: Diagnosis not present

## 2018-03-06 LAB — CUP PACEART INCLINIC DEVICE CHECK
Implantable Lead Implant Date: 20170801
Implantable Lead Implant Date: 20170801
Implantable Lead Location: 753860
Implantable Lead Model: 5076
Implantable Lead Model: 5076
Implantable Pulse Generator Implant Date: 20170801
MDC IDC LEAD LOCATION: 753859
MDC IDC SESS DTM: 20200107135038

## 2018-03-06 NOTE — Patient Instructions (Signed)
Medication Instructions:   Your physician recommends that you continue on your current medications as directed. Please refer to the Current Medication list given to you today.   If you need a refill on your cardiac medications before your next appointment, please call your pharmacy.   Lab work: NONE ORDERED  TODAY    If you have labs (blood work) drawn today and your tests are completely normal, you will receive your results only by: Marland Kitchen MyChart Message (if you have MyChart) OR . A paper copy in the mail If you have any lab test that is abnormal or we need to change your treatment, we will call you to review the results.  Testing/Procedures: NONE ORDERED  TODAY    Follow-Up:  At [facility], you and your health needs are our priority.  As part of our continuing mission to provide you with exceptional heart care, we have created designated Provider Care Teams.  These Care Teams include your primary Cardiologist (physician) and Advanced Practice Providers (APPs -  Physician Assistants and Nurse Practitioners) who all work together to provide you with the care you need, when you need it. You will need a follow up appointment in 1 years.  Please call our office 2 months in advance to schedule this appointment.  You may see  one of the following Advanced Practice Providers on your designated Care Team:   Gypsy Balsam, NP . Francis Dowse, PA-C    Remote monitoring is used to monitor your Pacemaker of ICD from home. This monitoring reduces the number of office visits required to check your device to one time per year. It allows [place] to keep an eye on the functioning of your device to ensure it is working properly. You are scheduled for a device check from home on .  05-02-18 You may send your transmission at any time that day. If you have a wireless device, the transmission will be sent automatically. After your physician reviews your transmission, you will receive a postcard with your next  transmission date.   Any Other Special Instructions Will Be Listed Below (If Applicable).

## 2018-03-12 ENCOUNTER — Other Ambulatory Visit: Payer: Self-pay | Admitting: Cardiology

## 2018-04-03 DIAGNOSIS — Z1231 Encounter for screening mammogram for malignant neoplasm of breast: Secondary | ICD-10-CM | POA: Diagnosis not present

## 2018-04-03 DIAGNOSIS — M810 Age-related osteoporosis without current pathological fracture: Secondary | ICD-10-CM | POA: Diagnosis not present

## 2018-05-02 ENCOUNTER — Ambulatory Visit (INDEPENDENT_AMBULATORY_CARE_PROVIDER_SITE_OTHER): Payer: Medicare Other | Admitting: *Deleted

## 2018-05-02 DIAGNOSIS — R55 Syncope and collapse: Secondary | ICD-10-CM

## 2018-05-03 LAB — CUP PACEART REMOTE DEVICE CHECK
Battery Remaining Longevity: 75 mo
Brady Statistic AS VS Percent: 0.17 %
Brady Statistic RA Percent Paced: 42.87 %
Date Time Interrogation Session: 20200304183005
Implantable Lead Implant Date: 20170801
Implantable Lead Implant Date: 20170801
Implantable Lead Location: 753860
Implantable Lead Model: 5076
Implantable Pulse Generator Implant Date: 20170801
Lead Channel Impedance Value: 437 Ohm
Lead Channel Pacing Threshold Amplitude: 0.5 V
Lead Channel Pacing Threshold Amplitude: 0.75 V
Lead Channel Pacing Threshold Pulse Width: 0.4 ms
Lead Channel Pacing Threshold Pulse Width: 0.4 ms
Lead Channel Sensing Intrinsic Amplitude: 1.125 mV
Lead Channel Sensing Intrinsic Amplitude: 1.125 mV
MDC IDC LEAD LOCATION: 753859
MDC IDC MSMT BATTERY VOLTAGE: 3 V
MDC IDC MSMT LEADCHNL RA IMPEDANCE VALUE: 361 Ohm
MDC IDC MSMT LEADCHNL RV IMPEDANCE VALUE: 494 Ohm
MDC IDC MSMT LEADCHNL RV IMPEDANCE VALUE: 589 Ohm
MDC IDC MSMT LEADCHNL RV SENSING INTR AMPL: 30.5 mV
MDC IDC MSMT LEADCHNL RV SENSING INTR AMPL: 30.5 mV
MDC IDC SET LEADCHNL RA PACING AMPLITUDE: 2 V
MDC IDC SET LEADCHNL RV PACING AMPLITUDE: 2.5 V
MDC IDC SET LEADCHNL RV PACING PULSEWIDTH: 0.4 ms
MDC IDC SET LEADCHNL RV SENSING SENSITIVITY: 2.8 mV
MDC IDC STAT BRADY AP VP PERCENT: 45.59 %
MDC IDC STAT BRADY AP VS PERCENT: 0 %
MDC IDC STAT BRADY AS VP PERCENT: 54.24 %
MDC IDC STAT BRADY RV PERCENT PACED: 99.82 %

## 2018-05-10 NOTE — Progress Notes (Signed)
Remote pacemaker transmission.   

## 2018-06-22 ENCOUNTER — Other Ambulatory Visit: Payer: Self-pay | Admitting: Cardiology

## 2018-06-22 NOTE — Telephone Encounter (Signed)
Amlodipine and lopressor refilled.

## 2018-08-01 ENCOUNTER — Ambulatory Visit (INDEPENDENT_AMBULATORY_CARE_PROVIDER_SITE_OTHER): Payer: Medicare Other | Admitting: *Deleted

## 2018-08-01 DIAGNOSIS — I442 Atrioventricular block, complete: Secondary | ICD-10-CM | POA: Diagnosis not present

## 2018-08-01 LAB — CUP PACEART REMOTE DEVICE CHECK
Battery Remaining Longevity: 69 mo
Battery Voltage: 3 V
Brady Statistic AP VP Percent: 57.29 %
Brady Statistic AP VS Percent: 0.02 %
Brady Statistic AS VP Percent: 41.19 %
Brady Statistic AS VS Percent: 1.51 %
Brady Statistic RA Percent Paced: 52.4 %
Brady Statistic RV Percent Paced: 96.71 %
Date Time Interrogation Session: 20200603135853
Implantable Lead Implant Date: 20170801
Implantable Lead Implant Date: 20170801
Implantable Lead Location: 753859
Implantable Lead Location: 753860
Implantable Lead Model: 5076
Implantable Lead Model: 5076
Implantable Pulse Generator Implant Date: 20170801
Lead Channel Impedance Value: 361 Ohm
Lead Channel Impedance Value: 437 Ohm
Lead Channel Impedance Value: 494 Ohm
Lead Channel Impedance Value: 570 Ohm
Lead Channel Pacing Threshold Amplitude: 0.625 V
Lead Channel Pacing Threshold Amplitude: 0.75 V
Lead Channel Pacing Threshold Pulse Width: 0.4 ms
Lead Channel Pacing Threshold Pulse Width: 0.4 ms
Lead Channel Sensing Intrinsic Amplitude: 1.375 mV
Lead Channel Sensing Intrinsic Amplitude: 1.375 mV
Lead Channel Sensing Intrinsic Amplitude: 10.25 mV
Lead Channel Sensing Intrinsic Amplitude: 10.25 mV
Lead Channel Setting Pacing Amplitude: 2 V
Lead Channel Setting Pacing Amplitude: 2.5 V
Lead Channel Setting Pacing Pulse Width: 0.4 ms
Lead Channel Setting Sensing Sensitivity: 2.8 mV

## 2018-08-09 ENCOUNTER — Encounter: Payer: Self-pay | Admitting: Cardiology

## 2018-08-09 NOTE — Progress Notes (Signed)
Remote pacemaker transmission.   

## 2018-09-16 ENCOUNTER — Other Ambulatory Visit: Payer: Self-pay | Admitting: Cardiology

## 2018-10-31 ENCOUNTER — Ambulatory Visit (INDEPENDENT_AMBULATORY_CARE_PROVIDER_SITE_OTHER): Payer: Medicare Other | Admitting: *Deleted

## 2018-10-31 DIAGNOSIS — I442 Atrioventricular block, complete: Secondary | ICD-10-CM | POA: Diagnosis not present

## 2018-10-31 DIAGNOSIS — I447 Left bundle-branch block, unspecified: Secondary | ICD-10-CM

## 2018-11-01 LAB — CUP PACEART REMOTE DEVICE CHECK
Battery Remaining Longevity: 68 mo
Battery Voltage: 3 V
Brady Statistic AP VP Percent: 53.58 %
Brady Statistic AP VS Percent: 0.01 %
Brady Statistic AS VP Percent: 45.56 %
Brady Statistic AS VS Percent: 0.86 %
Brady Statistic RA Percent Paced: 49.56 %
Brady Statistic RV Percent Paced: 98.27 %
Date Time Interrogation Session: 20200902142837
Implantable Lead Implant Date: 20170801
Implantable Lead Implant Date: 20170801
Implantable Lead Location: 753859
Implantable Lead Location: 753860
Implantable Lead Model: 5076
Implantable Lead Model: 5076
Implantable Pulse Generator Implant Date: 20170801
Lead Channel Impedance Value: 380 Ohm
Lead Channel Impedance Value: 437 Ohm
Lead Channel Impedance Value: 532 Ohm
Lead Channel Impedance Value: 608 Ohm
Lead Channel Pacing Threshold Amplitude: 0.5 V
Lead Channel Pacing Threshold Amplitude: 0.625 V
Lead Channel Pacing Threshold Pulse Width: 0.4 ms
Lead Channel Pacing Threshold Pulse Width: 0.4 ms
Lead Channel Sensing Intrinsic Amplitude: 1.625 mV
Lead Channel Sensing Intrinsic Amplitude: 1.625 mV
Lead Channel Sensing Intrinsic Amplitude: 25.75 mV
Lead Channel Sensing Intrinsic Amplitude: 25.75 mV
Lead Channel Setting Pacing Amplitude: 2 V
Lead Channel Setting Pacing Amplitude: 2.5 V
Lead Channel Setting Pacing Pulse Width: 0.4 ms
Lead Channel Setting Sensing Sensitivity: 2.8 mV

## 2018-11-16 NOTE — Progress Notes (Signed)
Remote pacemaker transmission.   

## 2018-12-12 ENCOUNTER — Other Ambulatory Visit: Payer: Self-pay | Admitting: Nurse Practitioner

## 2018-12-19 DIAGNOSIS — Z604 Social exclusion and rejection: Secondary | ICD-10-CM | POA: Diagnosis not present

## 2018-12-19 DIAGNOSIS — I421 Obstructive hypertrophic cardiomyopathy: Secondary | ICD-10-CM | POA: Diagnosis not present

## 2018-12-19 DIAGNOSIS — E559 Vitamin D deficiency, unspecified: Secondary | ICD-10-CM | POA: Diagnosis not present

## 2018-12-19 DIAGNOSIS — R6889 Other general symptoms and signs: Secondary | ICD-10-CM | POA: Diagnosis not present

## 2018-12-19 DIAGNOSIS — E079 Disorder of thyroid, unspecified: Secondary | ICD-10-CM | POA: Diagnosis not present

## 2018-12-19 DIAGNOSIS — I442 Atrioventricular block, complete: Secondary | ICD-10-CM | POA: Diagnosis not present

## 2018-12-19 DIAGNOSIS — F411 Generalized anxiety disorder: Secondary | ICD-10-CM | POA: Diagnosis not present

## 2018-12-19 DIAGNOSIS — N3941 Urge incontinence: Secondary | ICD-10-CM | POA: Diagnosis not present

## 2018-12-19 DIAGNOSIS — I714 Abdominal aortic aneurysm, without rupture: Secondary | ICD-10-CM | POA: Diagnosis not present

## 2018-12-19 DIAGNOSIS — N183 Chronic kidney disease, stage 3 unspecified: Secondary | ICD-10-CM | POA: Diagnosis not present

## 2018-12-19 DIAGNOSIS — Z0000 Encounter for general adult medical examination without abnormal findings: Secondary | ICD-10-CM | POA: Diagnosis not present

## 2018-12-24 NOTE — Progress Notes (Signed)
HPI: FU coronary artery disease. Previously cared for at [facility]. Patient suffered a non-ST elevation myocardial infarction in January 2015. She had a drug-eluting stent to the Lcx but I do not have details available. She also carries a diagnosis of supraventricular tachycardia and left bundle branch block. Carotid Dopplers December 2016 showed no hemodynamically significant stenosis. Abdominal ultrasound December 2016 showed mild abdominal aortic ectasia at 2.8 cm. Follow-up recommended 5 years. She has seen psychiatry for problems with stress related to her previous myocardial infarction and loss of her husband. 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 Procedure was complicated by pneumothorax requiring chest tube placement.  Last echocardiogram September 2019 showed normal LV systolic function, moderate left ventricular hypertrophy and moderate left atrial enlargement.  There was systolic anterior motion of the mitral valve with outflow tract gradient of 36 mmHg.  Mild mitral and tricuspid regurgitation.  Since last seen, occasional mild dyspnea on exertion but no orthopnea, PND, pedal edema, exertional chest pain or syncope.  Current Outpatient Medications  Medication Sig Dispense Refill  . ALPRAZolam (XANAX) 0.25 MG tablet Take 1 tablet (0.25 mg total) by mouth 2 (two) times daily as needed for anxiety. 60 tablet 1  . amLODipine (NORVASC) 5 MG tablet TAKE ONE TABLET BY MOUTH DAILY 90 tablet 0  . aspirin 81 MG tablet Take 81 mg by mouth every evening. Reported on 08/10/2015    . Cholecalciferol 4000 UNITS CAPS Take 4,000 Units by mouth every evening. Reported on 08/10/2015    . docusate sodium (COLACE) 100 MG capsule Take 100 mg by mouth daily.    Marland Kitchen losartan (COZAAR) 50 MG tablet Take 1 tablet (50 mg total) by mouth daily. Please make yearly appt with Dr. Rayann Heman for January before anymore refills. 1st attempt 90  tablet 0  . metoprolol tartrate (LOPRESSOR) 50 MG tablet TAKE ONE TABLET BY MOUTH TWICE A DAY 180 tablet 1  . nitroGLYCERIN (NITROSTAT) 0.4 MG SL tablet Place 1 tablet (0.4 mg total) under the tongue every 5 (five) minutes as needed for chest pain. Reported on 08/10/2015 25 tablet 12  . pravastatin (PRAVACHOL) 40 MG tablet Take 1 tablet (40 mg total) by mouth every evening. 90 tablet 3   No current facility-administered medications for this visit.      Past Medical History:  Diagnosis Date  . Anxiety   . Arthritis   . CAD (coronary artery disease)   . CHB (complete heart block) ([place])    a. s/p PPM on 09/29/15  . GERD (gastroesophageal reflux disease)   . Hyperlipidemia   . Hypertension   . LBBB (left bundle branch block)   . Status post placement of cardiac pacemaker    a. 09/2015: Medtronic Advisa MRI model [id number] (serial number [id number]) pacemaker  . Thyroid-related proptosis     Past Surgical History:  Procedure Laterality Date  . CARDIAC SURGERY     stent placed 03/04/13  . EP IMPLANTABLE DEVICE N/A 06/17/2015   Procedure: Loop Recorder Insertion;  Surgeon: Deboraha Sprang, MD;  Location: [place] CV LAB;  Service: Cardiovascular;  Laterality: N/A;  . EP IMPLANTABLE DEVICE N/A 09/29/2015   Procedure: Pacemaker Implant;  Surgeon: Thompson Grayer, MD;  Location: [place] CV LAB;  Service: Cardiovascular;  Laterality: N/A;  . No family history      Social History   Socioeconomic History  . Marital status: Widowed    Spouse  name: Not on file  . Number of children: 1  . Years of education: Not on file  . Highest education level: Not on file  Occupational History  . Not on file  Social Needs  . Financial resource strain: Not on file  . Food insecurity    Worry: Not on file    Inability: Not on file  . Transportation needs    Medical: Not on file    Non-medical: Not on file  Tobacco Use  . Smoking status: Former Games developer  . Smokeless tobacco: Never Used  Substance and  Sexual Activity  . Alcohol use: No    Alcohol/week: 0.0 standard drinks  . Drug use: No  . Sexual activity: Not on file  Lifestyle  . Physical activity    Days per week: Not on file    Minutes per session: Not on file  . Stress: Not on file  Relationships  . Social Musician on phone: Not on file    Gets together: Not on file    Attends religious service: Not on file    Active member of club or organization: Not on file    Attends meetings of clubs or organizations: Not on file    Relationship status: Not on file  . Intimate partner violence    Fear of current or ex partner: Not on file    Emotionally abused: Not on file    Physically abused: Not on file    Forced sexual activity: Not on file  Other Topics Concern  . Not on file  Social History Narrative  . Not on file    Family History  Problem Relation Age of Onset  . Arthritis Mother   . Hyperlipidemia Mother   . Hypertension Mother   . Hypothyroidism Mother   . Uterine cancer Maternal Grandmother     ROS: no fevers or chills, productive cough, hemoptysis, dysphasia, odynophagia, melena, hematochezia, dysuria, hematuria, rash, seizure activity, orthopnea, PND, pedal edema, claudication. Remaining systems are negative.  Physical Exam: Well-developed well-nourished in no acute distress.  Skin is warm and dry.  HEENT is normal.  Neck is supple.  Chest is clear to auscultation with normal expansion.  Cardiovascular exam is regular rate and rhythm.  2/6 systolic murmur left sternal border. Abdominal exam nontender or distended. No masses palpated. Extremities show no edema. neuro grossly intact  ECG-ventricular paced personally reviewed  A/P  1 coronary artery disease-patient has not had recurrent chest pain.  Continue medical therapy with aspirin and statin.  2 hyperlipidemia-continue statin.  Note she did not tolerate Crestor, Lipitor or Zocor previously.  3 hypertension-blood pressure is  elevated.  However she is typically controlled by her report.  Continue present medications and follow.  4 abdominal aortic aneurysm-follow-up ultrasound December 2021.  5 prior pacemaker-followed by electrophysiology.  6 hypertrophic cardiomyopathy-continue beta-blocker.  As outlined in previous notes she does not have risk factors for sudden death including no family history of sudden death, previous monitor showing no nonsustained ventricular tachycardia and previous treadmill showing no hypotensive response.  There is also no history of syncope.  She would also be at low risk given her age.  I explained that her daughter should be screened.  Olga Millers, MD

## 2018-12-26 ENCOUNTER — Other Ambulatory Visit: Payer: Self-pay

## 2018-12-26 ENCOUNTER — Encounter: Payer: Self-pay | Admitting: Cardiology

## 2018-12-26 ENCOUNTER — Ambulatory Visit (INDEPENDENT_AMBULATORY_CARE_PROVIDER_SITE_OTHER): Payer: Medicare Other | Admitting: Cardiology

## 2018-12-26 VITALS — BP 174/92 | HR 81 | Ht 64.0 in | Wt 144.0 lb

## 2018-12-26 DIAGNOSIS — I10 Essential (primary) hypertension: Secondary | ICD-10-CM | POA: Diagnosis not present

## 2018-12-26 DIAGNOSIS — E7800 Pure hypercholesterolemia, unspecified: Secondary | ICD-10-CM | POA: Diagnosis not present

## 2018-12-26 DIAGNOSIS — I421 Obstructive hypertrophic cardiomyopathy: Secondary | ICD-10-CM

## 2018-12-26 DIAGNOSIS — I2510 Atherosclerotic heart disease of native coronary artery without angina pectoris: Secondary | ICD-10-CM

## 2018-12-26 NOTE — Patient Instructions (Signed)
Medication Instructions:  NO CHANGE *If you need a refill on your cardiac medications before your next appointment, please call your pharmacy*  Lab Work: Your physician recommends that you HAVE LAB WORK TODAY If you have labs (blood work) drawn today and your tests are completely normal, you will receive your results only by: . MyChart Message (if you have MyChart) OR . A paper copy in the mail If you have any lab test that is abnormal or we need to change your treatment, we will call you to review the results.  Follow-Up: At [facility], you and your health needs are our priority.  As part of our continuing mission to provide you with exceptional heart care, we have created designated Provider Care Teams.  These Care Teams include your primary Cardiologist (physician) and Advanced Practice Providers (APPs -  Physician Assistants and Nurse Practitioners) who all work together to provide you with the care you need, when you need it.  Your next appointment:   12 months  The format for your next appointment:   In Person  Provider:   Brian Crenshaw, MD   

## 2018-12-27 LAB — LIPID PANEL
Chol/HDL Ratio: 5.3 ratio — ABNORMAL HIGH (ref 0.0–4.4)
Cholesterol, Total: 254 mg/dL — ABNORMAL HIGH (ref 100–199)
HDL: 48 mg/dL (ref >39)
LDL Chol Calc (NIH): 174 mg/dL — ABNORMAL HIGH (ref 0–99)
Triglycerides: 172 mg/dL — ABNORMAL HIGH (ref 0–149)
VLDL Cholesterol Cal: 32 mg/dL (ref 5–40)

## 2018-12-27 LAB — HEPATIC FUNCTION PANEL
ALT: 15 IU/L (ref 0–32)
AST: 19 IU/L (ref 0–40)
Albumin: 4.3 g/dL (ref 3.7–4.7)
Alkaline Phosphatase: 72 IU/L (ref 39–117)
Bilirubin Total: 0.3 mg/dL (ref 0.0–1.2)
Bilirubin, Direct: 0.09 mg/dL (ref 0.00–0.40)
Total Protein: 7.1 g/dL (ref 6.0–8.5)

## 2018-12-28 ENCOUNTER — Other Ambulatory Visit: Payer: Self-pay | Admitting: Cardiology

## 2018-12-28 ENCOUNTER — Telehealth: Payer: Self-pay | Admitting: *Deleted

## 2018-12-28 DIAGNOSIS — E7800 Pure hypercholesterolemia, unspecified: Secondary | ICD-10-CM

## 2018-12-28 DIAGNOSIS — I2510 Atherosclerotic heart disease of native coronary artery without angina pectoris: Secondary | ICD-10-CM

## 2018-12-28 DIAGNOSIS — E785 Hyperlipidemia, unspecified: Secondary | ICD-10-CM

## 2018-12-28 MED ORDER — EZETIMIBE 10 MG PO TABS: 10.0000 mg | ORAL_TABLET | Freq: Every day | ORAL | 3 refills | Status: DC

## 2018-12-28 NOTE — Telephone Encounter (Signed)
Results released to my chart  °New script sent to the pharmacy  °Lab orders mailed to the pt  °

## 2018-12-28 NOTE — Telephone Encounter (Signed)
-----   Message from Lelon Perla, MD sent at 12/27/2018  7:29 AM EDT ----- Add zetia 10 mg daily; lipids and liver 12 weeks Kirk Ruths

## 2019-01-18 ENCOUNTER — Other Ambulatory Visit: Payer: Self-pay

## 2019-01-30 ENCOUNTER — Ambulatory Visit (INDEPENDENT_AMBULATORY_CARE_PROVIDER_SITE_OTHER): Payer: Medicare Other | Admitting: *Deleted

## 2019-01-30 DIAGNOSIS — I442 Atrioventricular block, complete: Secondary | ICD-10-CM

## 2019-01-30 LAB — CUP PACEART REMOTE DEVICE CHECK
Battery Remaining Longevity: 67 mo
Battery Voltage: 3 V
Brady Statistic AP VP Percent: 55.07 %
Brady Statistic AP VS Percent: 0.01 %
Brady Statistic AS VP Percent: 44.27 %
Brady Statistic AS VS Percent: 0.65 %
Brady Statistic RA Percent Paced: 51.23 %
Brady Statistic RV Percent Paced: 98.78 %
Date Time Interrogation Session: 20201202114137
Implantable Lead Implant Date: 20170801
Implantable Lead Implant Date: 20170801
Implantable Lead Location: 753859
Implantable Lead Location: 753860
Implantable Lead Model: 5076
Implantable Lead Model: 5076
Implantable Pulse Generator Implant Date: 20170801
Lead Channel Impedance Value: 342 Ohm
Lead Channel Impedance Value: 399 Ohm
Lead Channel Impedance Value: 456 Ohm
Lead Channel Impedance Value: 532 Ohm
Lead Channel Pacing Threshold Amplitude: 0.625 V
Lead Channel Pacing Threshold Amplitude: 0.75 V
Lead Channel Pacing Threshold Pulse Width: 0.4 ms
Lead Channel Pacing Threshold Pulse Width: 0.4 ms
Lead Channel Sensing Intrinsic Amplitude: 1.375 mV
Lead Channel Sensing Intrinsic Amplitude: 1.375 mV
Lead Channel Sensing Intrinsic Amplitude: 13.75 mV
Lead Channel Sensing Intrinsic Amplitude: 13.75 mV
Lead Channel Setting Pacing Amplitude: 2 V
Lead Channel Setting Pacing Amplitude: 2.5 V
Lead Channel Setting Pacing Pulse Width: 0.4 ms
Lead Channel Setting Sensing Sensitivity: 2.8 mV

## 2019-02-25 NOTE — Progress Notes (Signed)
PPM Remote  

## 2019-03-09 ENCOUNTER — Other Ambulatory Visit: Payer: Self-pay | Admitting: Cardiology

## 2019-03-09 ENCOUNTER — Other Ambulatory Visit: Payer: Self-pay | Admitting: Nurse Practitioner

## 2019-03-12 ENCOUNTER — Other Ambulatory Visit: Payer: Self-pay | Admitting: Cardiology

## 2019-03-12 ENCOUNTER — Other Ambulatory Visit: Payer: Self-pay | Admitting: Nurse Practitioner

## 2019-03-25 ENCOUNTER — Telehealth: Payer: Self-pay | Admitting: Internal Medicine

## 2019-03-25 NOTE — Telephone Encounter (Signed)
Patient states that she is requesting to have her daughter, Rosalita Chessman, accompany her during her PHYS PACER CK appointment on 03/28/19 at 2:00 PM due to extreme anxiety and loss of memory. Please call.

## 2019-03-26 ENCOUNTER — Telehealth: Payer: Self-pay

## 2019-03-28 ENCOUNTER — Telehealth (INDEPENDENT_AMBULATORY_CARE_PROVIDER_SITE_OTHER): Payer: Medicare Other | Admitting: Internal Medicine

## 2019-03-28 ENCOUNTER — Encounter: Payer: Self-pay | Admitting: Internal Medicine

## 2019-03-28 VITALS — BP 136/88 | HR 75 | Ht 64.0 in | Wt 143.0 lb

## 2019-03-28 DIAGNOSIS — I10 Essential (primary) hypertension: Secondary | ICD-10-CM

## 2019-03-28 DIAGNOSIS — I421 Obstructive hypertrophic cardiomyopathy: Secondary | ICD-10-CM | POA: Diagnosis not present

## 2019-03-28 DIAGNOSIS — I442 Atrioventricular block, complete: Secondary | ICD-10-CM

## 2019-03-28 NOTE — Progress Notes (Signed)
Electrophysiology TeleHealth Note   Due to national recommendations of social distancing due to COVID 19, an audio/video telehealth visit is felt to be most appropriate for this patient at this time.  See MyChart message from today for the patient's consent to telehealth for [facility]   Date:  03/28/2019   ID:  Nicole Watkins, DOB Feb 05, 1941, MRN [id number]  Location: patient's home  Provider location:  [facility]  Evaluation Performed: Follow-up visit  PCP:  Alfonso Ellis, NP   Electrophysiologist:  Dr Rayann Heman  Chief Complaint:  palpitations  History of Present Illness:    Nicole Watkins is a 79 y.o. female who presents via telehealth conferencing today.  Since last being seen in our clinic, the patient reports doing reasonably well. + occasional edema. Today, she denies symptoms of palpitations, chest pain, shortness of breath,   dizziness, presyncope, or syncope.  The patient is otherwise without complaint today.  The patient denies symptoms of fevers, chills, cough, or new SOB worrisome for COVID 19.  Past Medical History:  Diagnosis Date  . Anxiety   . Arthritis   . CAD (coronary artery disease)   . CHB (complete heart block) ([place])    a. s/p PPM on 09/29/15  . GERD (gastroesophageal reflux disease)   . Hyperlipidemia   . Hypertension   . LBBB (left bundle branch block)   . Status post placement of cardiac pacemaker    a. 09/2015: Medtronic Advisa MRI model [id number] (serial number [id number]) pacemaker  . Thyroid-related proptosis     Past Surgical History:  Procedure Laterality Date  . CARDIAC SURGERY     stent placed 03/04/13  . EP IMPLANTABLE DEVICE N/A 06/17/2015   Procedure: Loop Recorder Insertion;  Surgeon: Deboraha Sprang, MD;  Location: [place] CV LAB;  Service: Cardiovascular;  Laterality: N/A;  . EP IMPLANTABLE DEVICE N/A 09/29/2015   Procedure: Pacemaker Implant;  Surgeon: Thompson Grayer, MD;  Location: [place] CV LAB;  Service: Cardiovascular;   Laterality: N/A;  . No family history      Current Outpatient Medications  Medication Sig Dispense Refill  . ALPRAZolam (XANAX) 0.25 MG tablet Take 1 tablet (0.25 mg total) by mouth 2 (two) times daily as needed for anxiety. 60 tablet 1  . amLODipine (NORVASC) 5 MG tablet TAKE ONE TABLET BY MOUTH DAILY 90 tablet 0  . aspirin 81 MG tablet Take 81 mg by mouth every evening. Reported on 08/10/2015    . Cholecalciferol 4000 UNITS CAPS Take 4,000 Units by mouth every evening. Reported on 08/10/2015    . docusate sodium (COLACE) 100 MG capsule Take 100 mg by mouth daily.    Marland Kitchen losartan (COZAAR) 50 MG tablet Take 1 tablet (50 mg total) by mouth daily. Please keep upcoming appt with Dr. Rayann Heman in January before anymore refills. Thank you 90 tablet 0  . metoprolol tartrate (LOPRESSOR) 50 MG tablet Take 1 tablet (50 mg total) by mouth 2 (two) times daily. 90 tablet 0  . nitroGLYCERIN (NITROSTAT) 0.4 MG SL tablet DISSOLVE 1 TAB UNDER TONGUE FOR CHEST PAIN - IF PAIN REMAINS AFTER 5 MIN, CALL 911 AND REPEAT DOSE. MAX 3 TABS IN 15 MINUTES 25 tablet 1  . pravastatin (PRAVACHOL) 40 MG tablet TAKE ONE TABLET BY MOUTH EVERY EVENING 90 tablet 3   No current facility-administered medications for this visit.    Allergies:   Lipitor [atorvastatin], Penicillins, Statins, Ambien [zolpidem tartrate], Crestor [rosuvastatin calcium], and Lisinopril   Social History:  The  patient  reports that she has quit smoking. She has never used smokeless tobacco. She reports that she does not drink alcohol or use drugs.   Family History:  The patient's  family history includes Arthritis in her mother; Hyperlipidemia in her mother; Hypertension in her mother; Hypothyroidism in her mother; Uterine cancer in her maternal grandmother.   ROS:  Please see the history of present illness.   All other systems are personally reviewed and negative.    Exam:    Vital Signs:  BP 136/88   Pulse 75   Ht 5\' 4"  (1.626 m)   Wt 143 lb (64.9  kg)   BMI 24.55 kg/m   Well sounding and appearing, alert and conversant, regular work of breathing,  good skin color Eyes- anicteric, neuro- grossly intact, skin- no apparent rash or lesions or cyanosis, mouth- oral mucosa is pink  Labs/Other Tests and Data Reviewed:    Recent Labs: 12/26/2018: ALT 15   Wt Readings from Last 3 Encounters:  03/28/19 143 lb (64.9 kg)  12/26/18 144 lb (65.3 kg)  03/06/18 139 lb 6.4 oz (63.2 kg)     Last device remote is reviewed from PaceART PDF which reveals normal device function, no arrhythmias    ASSESSMENT & PLAN:    1.  Transient complete heart block Remotes are uptodate Normal pacemaker function  2. HTN Stable No change required today  3. HCM Doing well on beta blocker therapy Followed by Dr 05/05/18 I agree with Dr [name] that she does not have ICD indication  4. CAD No ischemic symptoms No changes  Follow-up:  12 months with me Follow-up with Dr [name] as scheduled   Patient Risk:  after full review of this patients clinical status, I feel that they are at moderate risk at this time.  Today, I have spent 15 minutes with the patient with telehealth technology discussing arrhythmia management .    SignedJens Som, MD  03/28/2019 1:59 PM     [facility] [address] [place] [phone] (office) [phone] (fax)

## 2019-05-01 ENCOUNTER — Ambulatory Visit (INDEPENDENT_AMBULATORY_CARE_PROVIDER_SITE_OTHER): Payer: Medicare Other | Admitting: *Deleted

## 2019-05-01 DIAGNOSIS — I442 Atrioventricular block, complete: Secondary | ICD-10-CM | POA: Diagnosis not present

## 2019-05-01 LAB — CUP PACEART REMOTE DEVICE CHECK
Battery Remaining Longevity: 66 mo
Battery Voltage: 3 V
Brady Statistic AP VP Percent: 59.8 %
Brady Statistic AP VS Percent: 0.03 %
Brady Statistic AS VP Percent: 38.86 %
Brady Statistic AS VS Percent: 1.32 %
Brady Statistic RA Percent Paced: 53.54 %
Brady Statistic RV Percent Paced: 95.55 %
Date Time Interrogation Session: 20210303145445
Implantable Lead Implant Date: 20170801
Implantable Lead Implant Date: 20170801
Implantable Lead Location: 753859
Implantable Lead Location: 753860
Implantable Lead Model: 5076
Implantable Lead Model: 5076
Implantable Pulse Generator Implant Date: 20170801
Lead Channel Impedance Value: 342 Ohm
Lead Channel Impedance Value: 399 Ohm
Lead Channel Impedance Value: 494 Ohm
Lead Channel Impedance Value: 570 Ohm
Lead Channel Pacing Threshold Amplitude: 0.5 V
Lead Channel Pacing Threshold Amplitude: 0.625 V
Lead Channel Pacing Threshold Pulse Width: 0.4 ms
Lead Channel Pacing Threshold Pulse Width: 0.4 ms
Lead Channel Sensing Intrinsic Amplitude: 2 mV
Lead Channel Sensing Intrinsic Amplitude: 2 mV
Lead Channel Sensing Intrinsic Amplitude: 29.625 mV
Lead Channel Sensing Intrinsic Amplitude: 29.625 mV
Lead Channel Setting Pacing Amplitude: 2 V
Lead Channel Setting Pacing Amplitude: 2.5 V
Lead Channel Setting Pacing Pulse Width: 0.4 ms
Lead Channel Setting Sensing Sensitivity: 2.8 mV

## 2019-05-01 NOTE — Progress Notes (Signed)
PPM Remote  

## 2019-06-10 ENCOUNTER — Other Ambulatory Visit: Payer: Self-pay | Admitting: Nurse Practitioner

## 2019-06-10 ENCOUNTER — Other Ambulatory Visit: Payer: Self-pay | Admitting: Cardiology

## 2019-06-19 DIAGNOSIS — N183 Chronic kidney disease, stage 3 unspecified: Secondary | ICD-10-CM | POA: Diagnosis not present

## 2019-06-19 DIAGNOSIS — I714 Abdominal aortic aneurysm, without rupture: Secondary | ICD-10-CM | POA: Diagnosis not present

## 2019-06-19 DIAGNOSIS — Z5181 Encounter for therapeutic drug level monitoring: Secondary | ICD-10-CM | POA: Diagnosis not present

## 2019-06-19 DIAGNOSIS — I442 Atrioventricular block, complete: Secondary | ICD-10-CM | POA: Diagnosis not present

## 2019-06-19 DIAGNOSIS — M25473 Effusion, unspecified ankle: Secondary | ICD-10-CM | POA: Diagnosis not present

## 2019-06-19 DIAGNOSIS — I421 Obstructive hypertrophic cardiomyopathy: Secondary | ICD-10-CM | POA: Diagnosis not present

## 2019-06-19 DIAGNOSIS — E559 Vitamin D deficiency, unspecified: Secondary | ICD-10-CM | POA: Diagnosis not present

## 2019-06-19 DIAGNOSIS — I10 Essential (primary) hypertension: Secondary | ICD-10-CM | POA: Diagnosis not present

## 2019-06-19 DIAGNOSIS — Z8349 Family history of other endocrine, nutritional and metabolic diseases: Secondary | ICD-10-CM | POA: Diagnosis not present

## 2019-06-19 DIAGNOSIS — R6889 Other general symptoms and signs: Secondary | ICD-10-CM | POA: Diagnosis not present

## 2019-06-19 DIAGNOSIS — F411 Generalized anxiety disorder: Secondary | ICD-10-CM | POA: Diagnosis not present

## 2019-06-24 ENCOUNTER — Other Ambulatory Visit: Payer: Self-pay | Admitting: Cardiology

## 2019-06-26 DIAGNOSIS — E785 Hyperlipidemia, unspecified: Secondary | ICD-10-CM

## 2019-06-26 MED ORDER — EZETIMIBE 10 MG PO TABS: 10.0000 mg | ORAL_TABLET | Freq: Every day | ORAL | 3 refills | Status: DC

## 2019-06-26 MED ORDER — PRAVASTATIN SODIUM 80 MG PO TABS: 80.0000 mg | ORAL_TABLET | Freq: Every evening | ORAL | 3 refills | Status: DC

## 2019-06-26 NOTE — Telephone Encounter (Signed)
LDL too high; add zetia 10 mg daily and increase pravastatin to 80 mg daily; lipids and liver 12 weeks  Olga Millers

## 2019-06-27 DIAGNOSIS — H1013 Acute atopic conjunctivitis, bilateral: Secondary | ICD-10-CM | POA: Diagnosis not present

## 2019-06-27 DIAGNOSIS — H2512 Age-related nuclear cataract, left eye: Secondary | ICD-10-CM | POA: Diagnosis not present

## 2019-06-27 DIAGNOSIS — H40013 Open angle with borderline findings, low risk, bilateral: Secondary | ICD-10-CM | POA: Diagnosis not present

## 2019-06-27 MED ORDER — PRAVASTATIN SODIUM 40 MG PO TABS: 40.0000 mg | ORAL_TABLET | Freq: Every evening | ORAL | Status: DC

## 2019-06-27 NOTE — Addendum Note (Signed)
Addended by: Freddi Starr on: 06/27/2019 03:18 PM   Modules accepted: Orders

## 2019-07-03 ENCOUNTER — Other Ambulatory Visit: Payer: Self-pay

## 2019-07-09 DIAGNOSIS — H40013 Open angle with borderline findings, low risk, bilateral: Secondary | ICD-10-CM | POA: Diagnosis not present

## 2019-07-31 ENCOUNTER — Ambulatory Visit (INDEPENDENT_AMBULATORY_CARE_PROVIDER_SITE_OTHER): Payer: Medicare Other | Admitting: *Deleted

## 2019-07-31 DIAGNOSIS — I442 Atrioventricular block, complete: Secondary | ICD-10-CM

## 2019-07-31 DIAGNOSIS — R55 Syncope and collapse: Secondary | ICD-10-CM

## 2019-07-31 LAB — CUP PACEART REMOTE DEVICE CHECK
Battery Remaining Longevity: 58 mo
Battery Voltage: 2.99 V
Brady Statistic AP VP Percent: 62.47 %
Brady Statistic AP VS Percent: 0.02 %
Brady Statistic AS VP Percent: 33.9 %
Brady Statistic AS VS Percent: 3.6 %
Brady Statistic RA Percent Paced: 53.34 %
Brady Statistic RV Percent Paced: 86.73 %
Date Time Interrogation Session: 20210602145325
Implantable Lead Implant Date: 20170801
Implantable Lead Implant Date: 20170801
Implantable Lead Location: 753859
Implantable Lead Location: 753860
Implantable Lead Model: 5076
Implantable Lead Model: 5076
Implantable Pulse Generator Implant Date: 20170801
Lead Channel Impedance Value: 342 Ohm
Lead Channel Impedance Value: 399 Ohm
Lead Channel Impedance Value: 494 Ohm
Lead Channel Impedance Value: 589 Ohm
Lead Channel Pacing Threshold Amplitude: 0.625 V
Lead Channel Pacing Threshold Amplitude: 0.625 V
Lead Channel Pacing Threshold Pulse Width: 0.4 ms
Lead Channel Pacing Threshold Pulse Width: 0.4 ms
Lead Channel Sensing Intrinsic Amplitude: 2.375 mV
Lead Channel Sensing Intrinsic Amplitude: 2.375 mV
Lead Channel Sensing Intrinsic Amplitude: 23.625 mV
Lead Channel Sensing Intrinsic Amplitude: 23.625 mV
Lead Channel Setting Pacing Amplitude: 2 V
Lead Channel Setting Pacing Amplitude: 2.5 V
Lead Channel Setting Pacing Pulse Width: 0.4 ms
Lead Channel Setting Sensing Sensitivity: 2.8 mV

## 2019-08-02 NOTE — Progress Notes (Signed)
Remote pacemaker transmission.   

## 2019-09-09 DIAGNOSIS — I714 Abdominal aortic aneurysm, without rupture: Secondary | ICD-10-CM | POA: Diagnosis not present

## 2019-09-13 DIAGNOSIS — I771 Stricture of artery: Secondary | ICD-10-CM | POA: Diagnosis not present

## 2019-09-13 DIAGNOSIS — F411 Generalized anxiety disorder: Secondary | ICD-10-CM | POA: Diagnosis not present

## 2019-09-13 DIAGNOSIS — I10 Essential (primary) hypertension: Secondary | ICD-10-CM | POA: Diagnosis not present

## 2019-09-15 ENCOUNTER — Other Ambulatory Visit: Payer: Self-pay | Admitting: Cardiology

## 2019-10-30 ENCOUNTER — Ambulatory Visit (INDEPENDENT_AMBULATORY_CARE_PROVIDER_SITE_OTHER): Payer: Medicare Other | Admitting: *Deleted

## 2019-10-30 DIAGNOSIS — R55 Syncope and collapse: Secondary | ICD-10-CM | POA: Diagnosis not present

## 2019-10-30 LAB — CUP PACEART REMOTE DEVICE CHECK
Battery Remaining Longevity: 57 mo
Battery Voltage: 2.99 V
Brady Statistic AP VP Percent: 53.98 %
Brady Statistic AP VS Percent: 0.31 %
Brady Statistic AS VP Percent: 44.34 %
Brady Statistic AS VS Percent: 1.37 %
Brady Statistic RA Percent Paced: 50.78 %
Brady Statistic RV Percent Paced: 97.64 %
Date Time Interrogation Session: 20210901125811
Implantable Lead Implant Date: 20170801
Implantable Lead Implant Date: 20170801
Implantable Lead Location: 753859
Implantable Lead Location: 753860
Implantable Lead Model: 5076
Implantable Lead Model: 5076
Implantable Pulse Generator Implant Date: 20170801
Lead Channel Impedance Value: 342 Ohm
Lead Channel Impedance Value: 399 Ohm
Lead Channel Impedance Value: 475 Ohm
Lead Channel Impedance Value: 551 Ohm
Lead Channel Pacing Threshold Amplitude: 0.5 V
Lead Channel Pacing Threshold Amplitude: 0.625 V
Lead Channel Pacing Threshold Pulse Width: 0.4 ms
Lead Channel Pacing Threshold Pulse Width: 0.4 ms
Lead Channel Sensing Intrinsic Amplitude: 1.5 mV
Lead Channel Sensing Intrinsic Amplitude: 1.5 mV
Lead Channel Sensing Intrinsic Amplitude: 11.375 mV
Lead Channel Sensing Intrinsic Amplitude: 11.375 mV
Lead Channel Setting Pacing Amplitude: 2 V
Lead Channel Setting Pacing Amplitude: 2.5 V
Lead Channel Setting Pacing Pulse Width: 0.4 ms
Lead Channel Setting Sensing Sensitivity: 2.8 mV

## 2019-11-01 NOTE — Progress Notes (Signed)
Remote pacemaker transmission.   

## 2019-11-26 ENCOUNTER — Other Ambulatory Visit: Payer: Self-pay | Admitting: Cardiology

## 2019-12-13 ENCOUNTER — Other Ambulatory Visit: Payer: Self-pay | Admitting: Cardiology

## 2019-12-18 ENCOUNTER — Other Ambulatory Visit: Payer: Self-pay | Admitting: Cardiology

## 2019-12-18 ENCOUNTER — Other Ambulatory Visit: Payer: Self-pay | Admitting: Nurse Practitioner

## 2019-12-20 ENCOUNTER — Other Ambulatory Visit: Payer: Self-pay

## 2019-12-20 DIAGNOSIS — E785 Hyperlipidemia, unspecified: Secondary | ICD-10-CM

## 2019-12-23 MED ORDER — PRAVASTATIN SODIUM 40 MG PO TABS: 40.0000 mg | ORAL_TABLET | Freq: Every evening | ORAL | Status: DC

## 2019-12-23 NOTE — Telephone Encounter (Signed)
Refill Nicole Watkins  

## 2020-01-14 ENCOUNTER — Other Ambulatory Visit: Payer: Self-pay | Admitting: Cardiology

## 2020-01-14 DIAGNOSIS — E785 Hyperlipidemia, unspecified: Secondary | ICD-10-CM

## 2020-01-20 DIAGNOSIS — D649 Anemia, unspecified: Secondary | ICD-10-CM | POA: Diagnosis not present

## 2020-01-20 DIAGNOSIS — I10 Essential (primary) hypertension: Secondary | ICD-10-CM | POA: Diagnosis not present

## 2020-01-20 DIAGNOSIS — Z0000 Encounter for general adult medical examination without abnormal findings: Secondary | ICD-10-CM | POA: Diagnosis not present

## 2020-01-20 DIAGNOSIS — N183 Chronic kidney disease, stage 3 unspecified: Secondary | ICD-10-CM | POA: Diagnosis not present

## 2020-01-20 DIAGNOSIS — E7849 Other hyperlipidemia: Secondary | ICD-10-CM | POA: Diagnosis not present

## 2020-01-20 DIAGNOSIS — I421 Obstructive hypertrophic cardiomyopathy: Secondary | ICD-10-CM | POA: Diagnosis not present

## 2020-01-20 DIAGNOSIS — R6889 Other general symptoms and signs: Secondary | ICD-10-CM | POA: Diagnosis not present

## 2020-01-20 DIAGNOSIS — E538 Deficiency of other specified B group vitamins: Secondary | ICD-10-CM | POA: Diagnosis not present

## 2020-01-20 DIAGNOSIS — E559 Vitamin D deficiency, unspecified: Secondary | ICD-10-CM | POA: Diagnosis not present

## 2020-01-20 DIAGNOSIS — I771 Stricture of artery: Secondary | ICD-10-CM | POA: Diagnosis not present

## 2020-01-20 DIAGNOSIS — I714 Abdominal aortic aneurysm, without rupture: Secondary | ICD-10-CM | POA: Diagnosis not present

## 2020-01-20 DIAGNOSIS — F411 Generalized anxiety disorder: Secondary | ICD-10-CM | POA: Diagnosis not present

## 2020-01-29 ENCOUNTER — Ambulatory Visit (INDEPENDENT_AMBULATORY_CARE_PROVIDER_SITE_OTHER): Payer: Medicare Other

## 2020-01-29 DIAGNOSIS — R55 Syncope and collapse: Secondary | ICD-10-CM

## 2020-01-31 LAB — CUP PACEART REMOTE DEVICE CHECK
Battery Remaining Longevity: 57 mo
Battery Voltage: 2.99 V
Brady Statistic AP VP Percent: 49.72 %
Brady Statistic AP VS Percent: 0.08 %
Brady Statistic AS VP Percent: 49.62 %
Brady Statistic AS VS Percent: 0.58 %
Brady Statistic RA Percent Paced: 46.98 %
Brady Statistic RV Percent Paced: 99.2 %
Date Time Interrogation Session: 20211202120805
Implantable Lead Implant Date: 20170801
Implantable Lead Implant Date: 20170801
Implantable Lead Location: 753859
Implantable Lead Location: 753860
Implantable Lead Model: 5076
Implantable Lead Model: 5076
Implantable Pulse Generator Implant Date: 20170801
Lead Channel Impedance Value: 361 Ohm
Lead Channel Impedance Value: 380 Ohm
Lead Channel Impedance Value: 456 Ohm
Lead Channel Impedance Value: 532 Ohm
Lead Channel Pacing Threshold Amplitude: 0.5 V
Lead Channel Pacing Threshold Amplitude: 0.75 V
Lead Channel Pacing Threshold Pulse Width: 0.4 ms
Lead Channel Pacing Threshold Pulse Width: 0.4 ms
Lead Channel Sensing Intrinsic Amplitude: 1.375 mV
Lead Channel Sensing Intrinsic Amplitude: 1.375 mV
Lead Channel Sensing Intrinsic Amplitude: 20.875 mV
Lead Channel Sensing Intrinsic Amplitude: 20.875 mV
Lead Channel Setting Pacing Amplitude: 2 V
Lead Channel Setting Pacing Amplitude: 2.5 V
Lead Channel Setting Pacing Pulse Width: 0.4 ms
Lead Channel Setting Sensing Sensitivity: 2.8 mV

## 2020-02-03 ENCOUNTER — Other Ambulatory Visit: Payer: Self-pay | Admitting: Nurse Practitioner

## 2020-02-03 DIAGNOSIS — I10 Essential (primary) hypertension: Secondary | ICD-10-CM

## 2020-02-05 NOTE — Progress Notes (Signed)
Remote pacemaker transmission.   

## 2020-02-11 DIAGNOSIS — H40013 Open angle with borderline findings, low risk, bilateral: Secondary | ICD-10-CM | POA: Diagnosis not present

## 2020-02-26 DIAGNOSIS — E038 Other specified hypothyroidism: Secondary | ICD-10-CM | POA: Diagnosis not present

## 2020-03-09 NOTE — Progress Notes (Deleted)
HPI: FU coronary artery disease. Previously cared for at [facility]. Patient suffered a non-ST elevation myocardial infarction in January 2015. She had a drug-eluting stent to the Lcx but I do not have details available. She also carries a diagnosis of supraventricular tachycardia and left bundle branch block. Carotid Dopplers December 2016 showed no hemodynamically significant stenosis. She has seen psychiatry for problems with stress related to her previous myocardial infarction and loss of her husband. 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 Procedure was complicated by pneumothorax requiring chest tube placement.  Last echocardiogram September 2019 showed normal LV systolic function, moderate left ventricular hypertrophy and moderate left atrial enlargement.  There was systolic anterior motion of the mitral valve with outflow tract gradient of 36 mmHg.  Mild mitral and tricuspid regurgitation.    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  Since last seen,   Current Outpatient Medications  Medication Sig Dispense Refill  . ALPRAZolam (XANAX) 0.25 MG tablet Take 1 tablet (0.25 mg total) by mouth 2 (two) times daily as needed for anxiety. 60 tablet 1  . amLODipine (NORVASC) 5 MG tablet TAKE ONE TABLET BY MOUTH DAILY 90 tablet 1  . aspirin 81 MG tablet Take 81 mg by mouth every evening. Reported on 08/10/2015    . Cholecalciferol 4000 UNITS CAPS Take 4,000 Units by mouth every evening. Reported on 08/10/2015    . docusate sodium (COLACE) 100 MG capsule Take 100 mg by mouth daily.    Marland Kitchen losartan (COZAAR) 50 MG tablet TAKE ONE TABLET BY MOUTH DAILY. *NEED APPOINTMENT FOR FURTHER REFILLS 90 tablet 0  . metoprolol tartrate (LOPRESSOR) 50 MG tablet TAKE ONE TABLET BY MOUTH TWICE A DAY 21 tablet 0  . nitroGLYCERIN  (NITROSTAT) 0.4 MG SL tablet DISSOLVE 1 TAB UNDER TONGUE FOR CHEST PAIN - IF PAIN REMAINS AFTER 5 MIN, CALL 911 AND REPEAT DOSE. MAX 3 TABS IN 15 MINUTES 25 tablet 1  . pravastatin (PRAVACHOL) 40 MG tablet TAKE ONE TABLET BY MOUTH EVERY EVENING 21 tablet 1   No current facility-administered medications for this visit.     Past Medical History:  Diagnosis Date  . Anxiety   . Arthritis   . CAD (coronary artery disease)   . CHB (complete heart block) (HCC)    a. s/p PPM on 09/29/15  . GERD (gastroesophageal reflux disease)   . Hyperlipidemia   . Hypertension   . LBBB (left bundle branch block)   . Status post placement of cardiac pacemaker    a. 09/2015: Medtronic Advisa MRI model [id number] (serial number [id number]) pacemaker  . Thyroid-related proptosis     Past Surgical History:  Procedure Laterality Date  . CARDIAC SURGERY     stent placed 03/04/13  . EP IMPLANTABLE DEVICE N/A 06/17/2015   Procedure: Loop Recorder Insertion;  Surgeon: [name], MD;  Location: [facility] INVASIVE CV LAB;  Service: Cardiovascular;  Laterality: N/A;  . EP IMPLANTABLE DEVICE N/A 09/29/2015   Procedure: Pacemaker Implant;  Surgeon: [name], MD;  Location: [facility] INVASIVE CV LAB;  Service: Cardiovascular;  Laterality: N/A;  . No family history      Social History   Socioeconomic History  . Marital status: Widowed    Spouse name: Not on file  . Number of children: 1  . Years of education:  Not on file  . Highest education level: Not on file  Occupational History  . Not on file  Tobacco Use  . Smoking status: Former Games developer  . Smokeless tobacco: Never Used  Vaping Use  . Vaping Use: Never used  Substance and Sexual Activity  . Alcohol use: No    Alcohol/week: 0.0 standard drinks  . Drug use: No  . Sexual activity: Not on file  Other Topics Concern  . Not on file  Social History Narrative  . Not on file   Social Determinants of Health   Financial Resource Strain: Not on file  Food Insecurity:  Not on file  Transportation Needs: Not on file  Physical Activity: Not on file  Stress: Not on file  Social Connections: Not on file  Intimate Partner Violence: Not on file    Family History  Problem Relation Age of Onset  . Arthritis Mother   . Hyperlipidemia Mother   . Hypertension Mother   . Hypothyroidism Mother   . Uterine cancer Maternal Grandmother     ROS: no fevers or chills, productive cough, hemoptysis, dysphasia, odynophagia, melena, hematochezia, dysuria, hematuria, rash, seizure activity, orthopnea, PND, pedal edema, claudication. Remaining systems are negative.  Physical Exam: Well-developed well-nourished in no acute distress.  Skin is warm and dry.  HEENT is normal.  Neck is supple.  Chest is clear to auscultation with normal expansion.  Cardiovascular exam is regular rate and rhythm.  Abdominal exam nontender or distended. No masses palpated. Extremities show no edema. neuro grossly intact  ECG- personally reviewed  A/P  1 CAD-no CP; continue ASA and statin.   2 hypertension-BP controlled; continue present meds and follow.   3 hyperlipidemia-continue statin. Pt did not tolerate Crestor, Lipitor or Zocor previously.  4 abdominal aortic aneurysm-not noted on most recent ultrasound.  5 pacemaker-Per electrophysiology.  6 hypertrophic cardiomyopathy-continue beta-blocker at present dose.  Patient does not have risk factors for sudden death including no family history of sudden death, previous monitor showing no nonsustained ventricular tachycardia and no hypertensive response noted on previous treadmill testing.  She also has no history of syncope.  Also given her age I think risk would be low.  We again discussed the importance of having her children screened.  Olga Millers, MD

## 2020-03-18 ENCOUNTER — Ambulatory Visit: Payer: Medicare Other | Admitting: Cardiology

## 2020-03-30 ENCOUNTER — Encounter: Payer: Medicare Other | Admitting: Internal Medicine

## 2020-04-20 ENCOUNTER — Other Ambulatory Visit: Payer: Self-pay

## 2020-04-20 ENCOUNTER — Encounter: Payer: Self-pay | Admitting: Internal Medicine

## 2020-04-20 ENCOUNTER — Ambulatory Visit (INDEPENDENT_AMBULATORY_CARE_PROVIDER_SITE_OTHER): Payer: Medicare Other | Admitting: Internal Medicine

## 2020-04-20 VITALS — BP 164/94 | HR 72 | Ht 64.0 in | Wt 142.8 lb

## 2020-04-20 DIAGNOSIS — I421 Obstructive hypertrophic cardiomyopathy: Secondary | ICD-10-CM | POA: Diagnosis not present

## 2020-04-20 DIAGNOSIS — I442 Atrioventricular block, complete: Secondary | ICD-10-CM

## 2020-04-20 DIAGNOSIS — I2510 Atherosclerotic heart disease of native coronary artery without angina pectoris: Secondary | ICD-10-CM

## 2020-04-20 LAB — CUP PACEART INCLINIC DEVICE CHECK
Battery Remaining Longevity: 50 mo
Battery Voltage: 2.99 V
Brady Statistic AP VP Percent: 55.65 %
Brady Statistic AP VS Percent: 0.07 %
Brady Statistic AS VP Percent: 43.04 %
Brady Statistic AS VS Percent: 1.24 %
Brady Statistic RA Percent Paced: 51 %
Brady Statistic RV Percent Paced: 96.72 %
Date Time Interrogation Session: 20220221152030
Implantable Lead Implant Date: 20170801
Implantable Lead Implant Date: 20170801
Implantable Lead Location: 753859
Implantable Lead Location: 753860
Implantable Lead Model: 5076
Implantable Lead Model: 5076
Implantable Pulse Generator Implant Date: 20170801
Lead Channel Impedance Value: 361 Ohm
Lead Channel Impedance Value: 399 Ohm
Lead Channel Impedance Value: 475 Ohm
Lead Channel Impedance Value: 551 Ohm
Lead Channel Pacing Threshold Amplitude: 0.5 V
Lead Channel Pacing Threshold Amplitude: 0.75 V
Lead Channel Pacing Threshold Pulse Width: 0.4 ms
Lead Channel Pacing Threshold Pulse Width: 0.4 ms
Lead Channel Sensing Intrinsic Amplitude: 1.3 mV
Lead Channel Setting Pacing Amplitude: 2 V
Lead Channel Setting Pacing Amplitude: 2.5 V
Lead Channel Setting Pacing Pulse Width: 0.4 ms
Lead Channel Setting Sensing Sensitivity: 2.8 mV

## 2020-04-20 NOTE — Progress Notes (Signed)
PCP: Ardell Isaacs, NP   Primary EP:  [name]  Nicole Watkins is a 80 y.o. female who presents today for routine electrophysiology followup.  Since last being seen in our clinic, the patient reports doing very well.  Today, she denies symptoms of palpitations, chest pain, shortness of breath,  lower extremity edema, dizziness, presyncope, or syncope.  The patient is otherwise without complaint today.   Past Medical History:  Diagnosis Date  . Anxiety   . Arthritis   . CAD (coronary artery disease)   . CHB (complete heart block) (HCC)    a. s/p PPM on 09/29/15  . GERD (gastroesophageal reflux disease)   . Hyperlipidemia   . Hypertension   . LBBB (left bundle branch block)   . Status post placement of cardiac pacemaker    a. 09/2015: Medtronic Advisa MRI model [id number] (serial number [id number]) pacemaker  . Thyroid-related proptosis    Past Surgical History:  Procedure Laterality Date  . CARDIAC SURGERY     stent placed 03/04/13  . EP IMPLANTABLE DEVICE N/A 06/17/2015   Procedure: Loop Recorder Insertion;  Surgeon: [name], MD;  Location: [facility] INVASIVE CV LAB;  Service: Cardiovascular;  Laterality: N/A;  . EP IMPLANTABLE DEVICE N/A 09/29/2015   Procedure: Pacemaker Implant;  Surgeon: [name], MD;  Location: [facility] INVASIVE CV LAB;  Service: Cardiovascular;  Laterality: N/A;  . No family history      ROS- all systems are reviewed and negative except as per HPI above  Current Outpatient Medications  Medication Sig Dispense Refill  . thyroid (ARMOUR) 15 MG tablet Take 1 tablet by mouth daily.    Marland Kitchen ALPRAZolam (XANAX) 0.25 MG tablet Take 1 tablet (0.25 mg total) by mouth 2 (two) times daily as needed for anxiety. 60 tablet 1  . amLODipine (NORVASC) 5 MG tablet TAKE ONE TABLET BY MOUTH DAILY 90 tablet 1  . aspirin 81 MG tablet Take 81 mg by mouth every evening. Reported on 08/10/2015    . Cholecalciferol 4000 UNITS CAPS Take 4,000 Units by mouth every evening. Reported on 08/10/2015     . docusate sodium (COLACE) 100 MG capsule Take 100 mg by mouth daily.    Marland Kitchen losartan (COZAAR) 50 MG tablet TAKE ONE TABLET BY MOUTH DAILY. *NEED APPOINTMENT FOR FURTHER REFILLS 90 tablet 0  . metoprolol tartrate (LOPRESSOR) 50 MG tablet TAKE ONE TABLET BY MOUTH TWICE A DAY 21 tablet 0  . nitroGLYCERIN (NITROSTAT) 0.4 MG SL tablet DISSOLVE 1 TAB UNDER TONGUE FOR CHEST PAIN - IF PAIN REMAINS AFTER 5 MIN, CALL 911 AND REPEAT DOSE. MAX 3 TABS IN 15 MINUTES 25 tablet 1  . pravastatin (PRAVACHOL) 40 MG tablet TAKE ONE TABLET BY MOUTH EVERY EVENING 21 tablet 1   No current facility-administered medications for this visit.    Physical Exam: Vitals:   04/20/20 1455  BP: (!) 164/94  Pulse: 72  SpO2: 94%  Weight: 142 lb 12.8 oz (64.8 kg)  Height: 5\' 4"  (1.626 m)    GEN- The patient is well appearing, alert and oriented x 3 today.   Head- normocephalic, atraumatic Eyes-  Sclera clear, conjunctiva pink Ears- hearing intact Oropharynx- clear Lungs-  normal work of breathing Chest- pacemaker pocket is well healed Heart- Regular rate and rhythm, paced GI- soft, NT, ND, + BS Extremities- no clubbing, cyanosis, or edema  Pacemaker interrogation- reviewed in detail today,  See PACEART report  ekg tracing ordered today is personally reviewed and shows sinus with  first degree AV block and V pacing  Assessment and Plan:  1. Symptomatic complete heart block Normal pacemaker function See Pace Art report We will shorten AV interval from 300 msec to 180/150 msec. Due to far field sensing on the atrial lead, we also adjusted p wave sensitivity from 0.3 to 0.45 today she is device dependant today  2. HTN Stable No change required today  3. CAD No ischemic symptoms  4. HCM doing well with beta blocker therapy Follows with Dr [name]  Risks, benefits and potential toxicities for medications prescribed and/or refilled reviewed with patient today.   Return to see EP PA annually  [name] MD, [facility] 04/20/2020 3:38 PM

## 2020-04-20 NOTE — Patient Instructions (Addendum)
Medication Instructions:  Your physician recommends that you continue on your current medications as directed. Please refer to the Current Medication list given to you today.  Labwork: None ordered.  Testing/Procedures: None ordered.  Follow-Up: Your physician wants you to follow-up in: one year with   Francis Dowse, PA-C   You will receive a reminder letter in the mail two months in advance. If you don't receive a letter, please call our office to schedule the follow-up appointment.  Remote monitoring is used to monitor your Pacemaker from home. This monitoring reduces the number of office visits required to check your device to one time per year. It allows [place] to keep an eye on the functioning of your device to ensure it is working properly. You are scheduled for a device check from home on 04/29/20. You may send your transmission at any time that day. If you have a wireless device, the transmission will be sent automatically. After your physician reviews your transmission, you will receive a postcard with your next transmission date.  Any Other Special Instructions Will Be Listed Below (If Applicable).  If you need a refill on your cardiac medications before your next appointment, please call your pharmacy.

## 2020-04-29 ENCOUNTER — Ambulatory Visit (INDEPENDENT_AMBULATORY_CARE_PROVIDER_SITE_OTHER): Payer: Medicare Other

## 2020-04-29 DIAGNOSIS — I442 Atrioventricular block, complete: Secondary | ICD-10-CM | POA: Diagnosis not present

## 2020-04-30 ENCOUNTER — Telehealth: Payer: Self-pay

## 2020-04-30 NOTE — Telephone Encounter (Signed)
Spoke with patient to remind of missed remote transmission 

## 2020-05-01 LAB — CUP PACEART REMOTE DEVICE CHECK
Battery Remaining Longevity: 51 mo
Battery Voltage: 2.99 V
Brady Statistic AP VP Percent: 63.34 %
Brady Statistic AP VS Percent: 0.02 %
Brady Statistic AS VP Percent: 36.22 %
Brady Statistic AS VS Percent: 0.43 %
Brady Statistic RA Percent Paced: 62.96 %
Brady Statistic RV Percent Paced: 98.96 %
Date Time Interrogation Session: 20220303161946
Implantable Lead Implant Date: 20170801
Implantable Lead Implant Date: 20170801
Implantable Lead Location: 753859
Implantable Lead Location: 753860
Implantable Lead Model: 5076
Implantable Lead Model: 5076
Implantable Pulse Generator Implant Date: 20170801
Lead Channel Impedance Value: 361 Ohm
Lead Channel Impedance Value: 380 Ohm
Lead Channel Impedance Value: 456 Ohm
Lead Channel Impedance Value: 551 Ohm
Lead Channel Pacing Threshold Amplitude: 0.5 V
Lead Channel Pacing Threshold Amplitude: 0.75 V
Lead Channel Pacing Threshold Pulse Width: 0.4 ms
Lead Channel Pacing Threshold Pulse Width: 0.4 ms
Lead Channel Sensing Intrinsic Amplitude: 1.875 mV
Lead Channel Sensing Intrinsic Amplitude: 1.875 mV
Lead Channel Sensing Intrinsic Amplitude: 22.75 mV
Lead Channel Sensing Intrinsic Amplitude: 22.75 mV
Lead Channel Setting Pacing Amplitude: 2 V
Lead Channel Setting Pacing Amplitude: 2.5 V
Lead Channel Setting Pacing Pulse Width: 0.4 ms
Lead Channel Setting Sensing Sensitivity: 2.8 mV

## 2020-05-08 NOTE — Progress Notes (Signed)
Remote pacemaker transmission.   

## 2020-05-18 NOTE — Progress Notes (Signed)
HPI: FU coronary artery disease. Previously cared for at [facility]. Patient suffered a non-ST elevation myocardial infarction in January 2015. She had a drug-eluting stent to the Lcx but I do not have details available. She also carries a diagnosis of supraventricular tachycardia and left bundle branch block. Carotid Dopplers December 2016 showed no hemodynamically significant stenosis. She has seen psychiatry for problems with stress related to her previous myocardial infarction and loss of her husband. 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 Procedure was complicated by pneumothorax requiring chest tube placement.  Last echocardiogram September 2019 showed normal LV systolic function, moderate left ventricular hypertrophy and moderate left atrial enlargement.  There was systolic anterior motion of the mitral valve with outflow tract gradient of 36 mmHg.  Mild mitral and tricuspid regurgitation.  Abdominal ultrasound July 2021 showed no abdominal aortic aneurysm.  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 Since last seen,  she denies increased dyspnea, chest pain, palpitations or syncope.  Minimal pedal edema.  Current Outpatient Medications  Medication Sig Dispense Refill  . ALPRAZolam (XANAX) 0.25 MG tablet Take 1 tablet (0.25 mg total) by mouth 2 (two) times daily as needed for anxiety. 60 tablet 1  . amLODipine (NORVASC) 5 MG tablet TAKE ONE TABLET BY MOUTH DAILY 90 tablet 1  . aspirin 81 MG tablet Take 81 mg by mouth every evening. Reported on 08/10/2015    . Cholecalciferol 4000 UNITS CAPS Take 4,000 Units by mouth every evening. Reported on 08/10/2015    . docusate sodium (COLACE) 100 MG capsule Take 100 mg by mouth daily.    Marland Kitchen losartan (COZAAR) 50 MG tablet TAKE ONE TABLET BY MOUTH DAILY. *NEED APPOINTMENT FOR FURTHER REFILLS (Patient taking differently: 75 mg.) 90 tablet 0  .  metoprolol tartrate (LOPRESSOR) 50 MG tablet TAKE ONE TABLET BY MOUTH TWICE A DAY (Patient taking differently: Take 75 mg by mouth. 50 mg in morning and 25 mg in evening) 21 tablet 0  . nitroGLYCERIN (NITROSTAT) 0.4 MG SL tablet DISSOLVE 1 TAB UNDER TONGUE FOR CHEST PAIN - IF PAIN REMAINS AFTER 5 MIN, CALL 911 AND REPEAT DOSE. MAX 3 TABS IN 15 MINUTES 25 tablet 1  . thyroid (ARMOUR) 15 MG tablet Take 1 tablet by mouth daily. 15 mg four days a week and 30 mg three days a week     No current facility-administered medications for this visit.     Past Medical History:  Diagnosis Date  . Anxiety   . Arthritis   . CAD (coronary artery disease)   . CHB (complete heart block) (HCC)    a. s/p PPM on 09/29/15  . GERD (gastroesophageal reflux disease)   . Hyperlipidemia   . Hypertension   . LBBB (left bundle branch block)   . Status post placement of cardiac pacemaker    a. 09/2015: Medtronic Advisa MRI model [id number] (serial number [id number]) pacemaker  . Thyroid-related proptosis     Past Surgical History:  Procedure Laterality Date  . CARDIAC SURGERY     stent placed 03/04/13  . EP IMPLANTABLE DEVICE N/A 06/17/2015   Procedure: Loop Recorder Insertion;  Surgeon: [name], MD;  Location: [facility] INVASIVE CV LAB;  Service: Cardiovascular;  Laterality: N/A;  . EP IMPLANTABLE DEVICE N/A 09/29/2015   Procedure: Pacemaker Implant;  Surgeon: [name], MD;  Location: [facility] INVASIVE CV LAB;  Service: Cardiovascular;  Laterality:  N/A;  . No family history      Social History   Socioeconomic History  . Marital status: Widowed    Spouse name: Not on file  . Number of children: 1  . Years of education: Not on file  . Highest education level: Not on file  Occupational History  . Not on file  Tobacco Use  . Smoking status: Former Games developer  . Smokeless tobacco: Never Used  Vaping Use  . Vaping Use: Never used  Substance and Sexual Activity  . Alcohol use: No    Alcohol/week: 0.0 standard drinks   . Drug use: No  . Sexual activity: Not on file  Other Topics Concern  . Not on file  Social History Narrative  . Not on file   Social Determinants of Health   Financial Resource Strain: Not on file  Food Insecurity: Not on file  Transportation Needs: Not on file  Physical Activity: Not on file  Stress: Not on file  Social Connections: Not on file  Intimate Partner Violence: Not on file    Family History  Problem Relation Age of Onset  . Arthritis Mother   . Hyperlipidemia Mother   . Hypertension Mother   . Hypothyroidism Mother   . Uterine cancer Maternal Grandmother     ROS: no fevers or chills, productive cough, hemoptysis, dysphasia, odynophagia, melena, hematochezia, dysuria, hematuria, rash, seizure activity, orthopnea, PND, pedal edema, claudication. Remaining systems are negative.  Physical Exam: Well-developed well-nourished in no acute distress.  Skin is warm and dry.  HEENT is normal.  Neck is supple.  Chest is clear to auscultation with normal expansion.  Cardiovascular exam is regular rate and rhythm.  Abdominal exam nontender or distended. No masses palpated. Extremities show  edema. neuro grossly intact  ECG-sinus rhythm with ventricular pacing.  Personally reviewed  A/P  1 coronary artery disease-patient denies chest pain.  Continue aspirin, intolerant to statins.  2 hypertension-blood pressure elevated.  Increase amlodipine to 10 mg daily and follow.  3 hyperlipidemia-intolerant to statins.  Await lipids to be drawn in 2 weeks by primary care.  If LDL elevated will consider Zetia or Repatha.  4 hypertrophic cardiomyopathy-continue beta-blocker.  We again discussed that her daughter should be screened. Repeat echo.   5 abdominal aortic aneurysm-follow-up ultrasound July 2021 showed no aneurysm.  6 pacemaker-monitor in ED.  Olga Millers, MD

## 2020-05-20 ENCOUNTER — Ambulatory Visit (INDEPENDENT_AMBULATORY_CARE_PROVIDER_SITE_OTHER): Payer: Medicare Other | Admitting: Cardiology

## 2020-05-20 ENCOUNTER — Other Ambulatory Visit: Payer: Self-pay

## 2020-05-20 ENCOUNTER — Encounter: Payer: Self-pay | Admitting: Cardiology

## 2020-05-20 VITALS — BP 203/96 | HR 75 | Ht 63.5 in | Wt 144.2 lb

## 2020-05-20 DIAGNOSIS — E785 Hyperlipidemia, unspecified: Secondary | ICD-10-CM | POA: Diagnosis not present

## 2020-05-20 DIAGNOSIS — I2510 Atherosclerotic heart disease of native coronary artery without angina pectoris: Secondary | ICD-10-CM | POA: Diagnosis not present

## 2020-05-20 DIAGNOSIS — I10 Essential (primary) hypertension: Secondary | ICD-10-CM | POA: Diagnosis not present

## 2020-05-20 DIAGNOSIS — I714 Abdominal aortic aneurysm, without rupture, unspecified: Secondary | ICD-10-CM

## 2020-05-20 DIAGNOSIS — I421 Obstructive hypertrophic cardiomyopathy: Secondary | ICD-10-CM | POA: Diagnosis not present

## 2020-05-20 MED ORDER — AMLODIPINE BESYLATE 10 MG PO TABS: 10.0000 mg | ORAL_TABLET | Freq: Every day | ORAL | 3 refills | Status: DC

## 2020-05-20 NOTE — Patient Instructions (Signed)
Medication Instructions:   INCREASE AMLODIPINE TO 10 MG ONCE DAILY= 2 OF THE 5 MG TABLETS ONCE DAILY  *If you need a refill on your cardiac medications before your next appointment, please call your pharmacy*  Testing/Procedures:  Your physician has requested that you have an echocardiogram. Echocardiography is a painless test that uses sound waves to create images of your heart. It provides your doctor with information about the size and shape of your heart and how well your heart's chambers and valves are working. This procedure takes approximately one hour. There are no restrictions for this procedure.TO FOLLOW UP ON HOCM     Follow-Up: At [facility], you and your health needs are our priority.  As part of our continuing mission to provide you with exceptional heart care, we have created designated Provider Care Teams.  These Care Teams include your primary Cardiologist (physician) and Advanced Practice Providers (APPs -  Physician Assistants and Nurse Practitioners) who all work together to provide you with the care you need, when you need it.  We recommend signing up for the patient portal called "MyChart".  Sign up information is provided on this After Visit Summary.  MyChart is used to connect with patients for Virtual Visits (Telemedicine).  Patients are able to view lab/test results, encounter notes, upcoming appointments, etc.  Non-urgent messages can be sent to your provider as well.   To learn more about what you can do with MyChart, go to [email or web address].    Your next appointment:   6 month(s)  The format for your next appointment:   In Person  Provider:   Olga Millers, MD

## 2020-05-21 DIAGNOSIS — I10 Essential (primary) hypertension: Secondary | ICD-10-CM

## 2020-05-22 MED ORDER — LOSARTAN POTASSIUM 50 MG PO TABS: 75.0000 mg | ORAL_TABLET | Freq: Every day | ORAL | 0 refills | Status: DC

## 2020-06-03 ENCOUNTER — Ambulatory Visit: Payer: Medicare Other | Admitting: Cardiology

## 2020-06-10 ENCOUNTER — Other Ambulatory Visit: Payer: Self-pay | Admitting: Cardiology

## 2020-07-15 DIAGNOSIS — I2510 Atherosclerotic heart disease of native coronary artery without angina pectoris: Secondary | ICD-10-CM

## 2020-07-15 MED ORDER — AMLODIPINE BESYLATE 5 MG PO TABS: 5.0000 mg | ORAL_TABLET | Freq: Every day | ORAL | 3 refills | Status: AC

## 2020-07-24 ENCOUNTER — Encounter: Payer: Self-pay | Admitting: *Deleted

## 2020-07-24 NOTE — Telephone Encounter (Signed)
Patient was seen by her medical doctor and they are asking what kind of diuretic we would like to use. Tried to call the patient, no answer. Left message for pt to call.

## 2020-07-29 ENCOUNTER — Ambulatory Visit (INDEPENDENT_AMBULATORY_CARE_PROVIDER_SITE_OTHER): Payer: Medicare Other

## 2020-07-29 DIAGNOSIS — I442 Atrioventricular block, complete: Secondary | ICD-10-CM

## 2020-07-30 LAB — CUP PACEART REMOTE DEVICE CHECK
Battery Remaining Longevity: 54 mo
Battery Voltage: 2.99 V
Brady Statistic AP VP Percent: 32.36 %
Brady Statistic AP VS Percent: 0 %
Brady Statistic AS VP Percent: 67.07 %
Brady Statistic AS VS Percent: 0.56 %
Brady Statistic RA Percent Paced: 31.82 %
Brady Statistic RV Percent Paced: 98.02 %
Date Time Interrogation Session: 20220602132510
Implantable Lead Implant Date: 20170801
Implantable Lead Implant Date: 20170801
Implantable Lead Location: 753859
Implantable Lead Location: 753860
Implantable Lead Model: 5076
Implantable Lead Model: 5076
Implantable Pulse Generator Implant Date: 20170801
Lead Channel Impedance Value: 361 Ohm
Lead Channel Impedance Value: 399 Ohm
Lead Channel Impedance Value: 513 Ohm
Lead Channel Impedance Value: 589 Ohm
Lead Channel Pacing Threshold Amplitude: 0.5 V
Lead Channel Pacing Threshold Amplitude: 0.625 V
Lead Channel Pacing Threshold Pulse Width: 0.4 ms
Lead Channel Pacing Threshold Pulse Width: 0.4 ms
Lead Channel Sensing Intrinsic Amplitude: 1.375 mV
Lead Channel Sensing Intrinsic Amplitude: 1.375 mV
Lead Channel Sensing Intrinsic Amplitude: 24.25 mV
Lead Channel Sensing Intrinsic Amplitude: 24.25 mV
Lead Channel Setting Pacing Amplitude: 2 V
Lead Channel Setting Pacing Amplitude: 2.5 V
Lead Channel Setting Pacing Pulse Width: 0.4 ms
Lead Channel Setting Sensing Sensitivity: 2.8 mV

## 2020-08-03 NOTE — Telephone Encounter (Signed)
This encounter was created in error - please disregard.

## 2020-08-18 ENCOUNTER — Encounter: Payer: Self-pay | Admitting: *Deleted

## 2020-08-21 NOTE — Progress Notes (Signed)
Remote pacemaker transmission.   

## 2020-08-31 ENCOUNTER — Other Ambulatory Visit: Payer: Self-pay | Admitting: Internal Medicine

## 2020-08-31 DIAGNOSIS — I10 Essential (primary) hypertension: Secondary | ICD-10-CM

## 2020-09-02 NOTE — Telephone Encounter (Signed)
This is Dr. Crenshaw's pt 

## 2020-10-28 ENCOUNTER — Ambulatory Visit (INDEPENDENT_AMBULATORY_CARE_PROVIDER_SITE_OTHER): Payer: Medicare Other

## 2020-10-28 DIAGNOSIS — I442 Atrioventricular block, complete: Secondary | ICD-10-CM | POA: Diagnosis not present

## 2020-10-28 LAB — CUP PACEART REMOTE DEVICE CHECK
Battery Remaining Longevity: 47 mo
Battery Voltage: 2.98 V
Brady Statistic AP VP Percent: 22.05 %
Brady Statistic AP VS Percent: 0 %
Brady Statistic AS VP Percent: 77.7 %
Brady Statistic AS VS Percent: 0.25 %
Brady Statistic RA Percent Paced: 21.85 %
Brady Statistic RV Percent Paced: 98.98 %
Date Time Interrogation Session: 20220831101416
Implantable Lead Implant Date: 20170801
Implantable Lead Implant Date: 20170801
Implantable Lead Location: 753859
Implantable Lead Location: 753860
Implantable Lead Model: 5076
Implantable Lead Model: 5076
Implantable Pulse Generator Implant Date: 20170801
Lead Channel Impedance Value: 361 Ohm
Lead Channel Impedance Value: 399 Ohm
Lead Channel Impedance Value: 475 Ohm
Lead Channel Impedance Value: 551 Ohm
Lead Channel Pacing Threshold Amplitude: 0.5 V
Lead Channel Pacing Threshold Amplitude: 0.625 V
Lead Channel Pacing Threshold Pulse Width: 0.4 ms
Lead Channel Pacing Threshold Pulse Width: 0.4 ms
Lead Channel Sensing Intrinsic Amplitude: 1.125 mV
Lead Channel Sensing Intrinsic Amplitude: 1.125 mV
Lead Channel Sensing Intrinsic Amplitude: 31.625 mV
Lead Channel Sensing Intrinsic Amplitude: 31.625 mV
Lead Channel Setting Pacing Amplitude: 2 V
Lead Channel Setting Pacing Amplitude: 2.5 V
Lead Channel Setting Pacing Pulse Width: 0.4 ms
Lead Channel Setting Sensing Sensitivity: 2.8 mV

## 2020-11-10 NOTE — Progress Notes (Signed)
Remote pacemaker transmission.   

## 2020-11-18 NOTE — Progress Notes (Signed)
HPI: FU coronary artery disease. Previously cared for at [facility]. Patient suffered a non-ST elevation myocardial infarction in January 2015. She had a drug-eluting stent to the Lcx but I do not have details available. She also carries a diagnosis of supraventricular tachycardia and left bundle branch block. Carotid Dopplers December 2016 showed no hemodynamically significant stenosis. She has seen psychiatry for problems with stress related to her previous myocardial infarction and loss of her husband. 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 Procedure was complicated by pneumothorax requiring chest tube placement.  Abdominal ultrasound July 2021 showed no abdominal aortic aneurysm.  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  Peak gradient across LVOT 184 mmHg.  Since last seen, there is dyspnea with more vigorous activities.  No chest pain, palpitations or syncope.  Current Outpatient Medications  Medication Sig Dispense Refill   ALPRAZolam (XANAX) 0.25 MG tablet Take 1 tablet (0.25 mg total) by mouth 2 (two) times daily as needed for anxiety. 60 tablet 1   amLODipine (NORVASC) 5 MG tablet Take 1 tablet (5 mg total) by mouth daily. 180 tablet 3   aspirin 81 MG tablet Take 81 mg by mouth every evening. Reported on 08/10/2015     Cholecalciferol 4000 UNITS CAPS Take 4,000 Units by mouth every evening. Reported on 08/10/2015     docusate sodium (COLACE) 100 MG capsule Take 100 mg by mouth daily.     losartan (COZAAR) 50 MG tablet TAKE 1 TABLET BY MOUTH DAILY (Patient taking differently: 75 mg.) 50 tablet 6   metoprolol tartrate (LOPRESSOR) 50 MG  tablet TAKE ONE TABLET BY MOUTH TWICE A DAY (Patient taking differently: Take 75 mg by mouth. 50 mg in morning and 25 mg in evening) 21 tablet 0   nitroGLYCERIN (NITROSTAT) 0.4 MG SL tablet DISSOLVE 1 TAB UNDER TONGUE FOR CHEST PAIN - IF PAIN REMAINS AFTER 5 MIN, CALL 911 AND REPEAT DOSE. MAX 3 TABS IN 15 MINUTES 25 tablet 1   thyroid (ARMOUR) 15 MG tablet Take 1 tablet by mouth daily. 30 mg seven days a week     No current facility-administered medications for this visit.     Past Medical History:  Diagnosis Date   Anxiety    Arthritis    CAD (coronary artery disease)    CHB (complete heart block) (HCC)    a. s/p PPM on 09/29/15   GERD (gastroesophageal reflux disease)    Hyperlipidemia    Hypertension    LBBB (left bundle branch block)    Status post placement of cardiac pacemaker    a. 09/2015: Medtronic Advisa MRI model A2DR01 (serial number [id number]) pacemaker   Thyroid-related proptosis     Past Surgical History:  Procedure Laterality Date   CARDIAC SURGERY     stent placed 03/04/13   EP IMPLANTABLE DEVICE N/A 06/17/2015   Procedure: Loop Recorder Insertion;  Surgeon: [name], MD;  Location: [facility] INVASIVE CV LAB;  Service: Cardiovascular;  Laterality: N/A;   EP IMPLANTABLE DEVICE N/A 09/29/2015   Procedure: Pacemaker Implant;  Surgeon: [name], MD;  Location: [facility] INVASIVE CV LAB;  Service: Cardiovascular;  Laterality: N/A;   No family history  Social History   Socioeconomic History   Marital status: Widowed    Spouse name: Not on file   Number of children: 1   Years of education: Not on file   Highest education level: Not on file  Occupational History   Not on file  Tobacco Use   Smoking status: Former   Smokeless tobacco: Never  Vaping Use   Vaping Use: Never used  Substance and Sexual Activity   Alcohol use: No    Alcohol/week: 0.0 standard drinks   Drug use: No   Sexual activity: Not on file  Other Topics Concern   Not on file  Social History  Narrative   Not on file   Social Determinants of Health   Financial Resource Strain: Not on file  Food Insecurity: Not on file  Transportation Needs: Not on file  Physical Activity: Not on file  Stress: Not on file  Social Connections: Not on file  Intimate Partner Violence: Not on file    Family History  Problem Relation Age of Onset   Arthritis Mother    Hyperlipidemia Mother    Hypertension Mother    Hypothyroidism Mother    Uterine cancer Maternal Grandmother     ROS: no fevers or chills, productive cough, hemoptysis, dysphasia, odynophagia, melena, hematochezia, dysuria, hematuria, rash, seizure activity, orthopnea, PND, pedal edema, claudication. Remaining systems are negative.  Physical Exam: Well-developed well-nourished in no acute distress.  Skin is warm and dry.  HEENT is normal.  Neck is supple.  Chest is clear to auscultation with normal expansion.  Cardiovascular exam is regular rate and rhythm.  2/6 systolic murmur left sternal border.  Increased with Valsalva. Abdominal exam nontender or distended. No masses palpated. Extremities show no edema. neuro grossly intact  ECG- personally reviewed  A/P  1 coronary artery disease-patient doing well with no chest pain.  Continue aspirin.  She is intolerant to statins.  2 hyperlipidemia-intolerant to statins.  Not interested in other medications.  3 hypertension-blood pressure elevated; however she follows this at home and typically controlled.  Continue present medications.  4 hypertrophic cardiomyopathy-we will continue beta-blocker.    5 abdominal aortic aneurysm-most recent ultrasound showed no aneurysm.  6 pacemaker-Per electrophysiology.  Olga Millers, MD

## 2020-11-23 ENCOUNTER — Ambulatory Visit (INDEPENDENT_AMBULATORY_CARE_PROVIDER_SITE_OTHER): Payer: Medicare Other | Admitting: Cardiology

## 2020-11-23 ENCOUNTER — Encounter: Payer: Self-pay | Admitting: Cardiology

## 2020-11-23 ENCOUNTER — Other Ambulatory Visit: Payer: Self-pay

## 2020-11-23 VITALS — BP 198/95 | HR 73 | Ht 64.0 in | Wt 145.1 lb

## 2020-11-23 DIAGNOSIS — E785 Hyperlipidemia, unspecified: Secondary | ICD-10-CM

## 2020-11-23 DIAGNOSIS — I2510 Atherosclerotic heart disease of native coronary artery without angina pectoris: Secondary | ICD-10-CM | POA: Diagnosis not present

## 2020-11-23 DIAGNOSIS — I10 Essential (primary) hypertension: Secondary | ICD-10-CM | POA: Diagnosis not present

## 2020-11-23 DIAGNOSIS — I421 Obstructive hypertrophic cardiomyopathy: Secondary | ICD-10-CM

## 2020-11-23 MED ORDER — LOSARTAN POTASSIUM 50 MG PO TABS
75.0000 mg | ORAL_TABLET | Freq: Every day | ORAL | 6 refills | Status: AC
Start: 2020-11-23 — End: ?

## 2020-11-23 MED ORDER — METOPROLOL TARTRATE 50 MG PO TABS: ORAL_TABLET | ORAL | 3 refills | Status: AC

## 2020-11-23 NOTE — Patient Instructions (Signed)

## 2021-01-27 ENCOUNTER — Ambulatory Visit (INDEPENDENT_AMBULATORY_CARE_PROVIDER_SITE_OTHER): Payer: Medicare Other

## 2021-01-27 DIAGNOSIS — I442 Atrioventricular block, complete: Secondary | ICD-10-CM | POA: Diagnosis not present

## 2021-01-27 LAB — CUP PACEART REMOTE DEVICE CHECK
Battery Remaining Longevity: 50 mo
Battery Voltage: 2.98 V
Brady Statistic AP VP Percent: 22 %
Brady Statistic AP VS Percent: 0 %
Brady Statistic AS VP Percent: 77.74 %
Brady Statistic AS VS Percent: 0.26 %
Brady Statistic RA Percent Paced: 21.9 %
Brady Statistic RV Percent Paced: 99.4 %
Date Time Interrogation Session: 20221130125126
Implantable Lead Implant Date: 20170801
Implantable Lead Implant Date: 20170801
Implantable Lead Location: 753859
Implantable Lead Location: 753860
Implantable Lead Model: 5076
Implantable Lead Model: 5076
Implantable Pulse Generator Implant Date: 20170801
Lead Channel Impedance Value: 361 Ohm
Lead Channel Impedance Value: 418 Ohm
Lead Channel Impedance Value: 513 Ohm
Lead Channel Impedance Value: 589 Ohm
Lead Channel Pacing Threshold Amplitude: 0.5 V
Lead Channel Pacing Threshold Amplitude: 0.625 V
Lead Channel Pacing Threshold Pulse Width: 0.4 ms
Lead Channel Pacing Threshold Pulse Width: 0.4 ms
Lead Channel Sensing Intrinsic Amplitude: 1.375 mV
Lead Channel Sensing Intrinsic Amplitude: 1.375 mV
Lead Channel Sensing Intrinsic Amplitude: 14.875 mV
Lead Channel Sensing Intrinsic Amplitude: 14.875 mV
Lead Channel Setting Pacing Amplitude: 2 V
Lead Channel Setting Pacing Amplitude: 2.5 V
Lead Channel Setting Pacing Pulse Width: 0.4 ms
Lead Channel Setting Sensing Sensitivity: 2.8 mV

## 2021-02-05 NOTE — Progress Notes (Signed)
Remote pacemaker transmission.   

## 2021-04-07 ENCOUNTER — Emergency Department (HOSPITAL_COMMUNITY): Payer: Medicare Other

## 2021-04-07 ENCOUNTER — Encounter (HOSPITAL_COMMUNITY): Payer: Self-pay | Admitting: Emergency Medicine

## 2021-04-07 ENCOUNTER — Inpatient Hospital Stay (HOSPITAL_COMMUNITY)
Admission: EM | Admit: 2021-04-07 | Discharge: 2021-04-28 | DRG: 951 | Disposition: E | Payer: Medicare Other | Attending: Neurosurgery | Admitting: Neurosurgery

## 2021-04-07 DIAGNOSIS — Z515 Encounter for palliative care: Secondary | ICD-10-CM | POA: Diagnosis not present

## 2021-04-07 DIAGNOSIS — I10 Essential (primary) hypertension: Secondary | ICD-10-CM | POA: Diagnosis present

## 2021-04-07 DIAGNOSIS — Z8249 Family history of ischemic heart disease and other diseases of the circulatory system: Secondary | ICD-10-CM

## 2021-04-07 DIAGNOSIS — K219 Gastro-esophageal reflux disease without esophagitis: Secondary | ICD-10-CM | POA: Diagnosis present

## 2021-04-07 DIAGNOSIS — R471 Dysarthria and anarthria: Secondary | ICD-10-CM | POA: Diagnosis present

## 2021-04-07 DIAGNOSIS — I6011 Nontraumatic subarachnoid hemorrhage from right middle cerebral artery: Secondary | ICD-10-CM | POA: Diagnosis present

## 2021-04-07 DIAGNOSIS — J9601 Acute respiratory failure with hypoxia: Secondary | ICD-10-CM | POA: Diagnosis present

## 2021-04-07 DIAGNOSIS — E785 Hyperlipidemia, unspecified: Secondary | ICD-10-CM | POA: Diagnosis present

## 2021-04-07 DIAGNOSIS — Z955 Presence of coronary angioplasty implant and graft: Secondary | ICD-10-CM

## 2021-04-07 DIAGNOSIS — M1990 Unspecified osteoarthritis, unspecified site: Secondary | ICD-10-CM | POA: Diagnosis present

## 2021-04-07 DIAGNOSIS — G935 Compression of brain: Secondary | ICD-10-CM | POA: Diagnosis present

## 2021-04-07 DIAGNOSIS — I2510 Atherosclerotic heart disease of native coronary artery without angina pectoris: Secondary | ICD-10-CM | POA: Diagnosis present

## 2021-04-07 DIAGNOSIS — F419 Anxiety disorder, unspecified: Secondary | ICD-10-CM | POA: Diagnosis present

## 2021-04-07 DIAGNOSIS — G8194 Hemiplegia, unspecified affecting left nondominant side: Secondary | ICD-10-CM | POA: Diagnosis present

## 2021-04-07 DIAGNOSIS — Z20822 Contact with and (suspected) exposure to covid-19: Secondary | ICD-10-CM | POA: Diagnosis present

## 2021-04-07 DIAGNOSIS — I609 Nontraumatic subarachnoid hemorrhage, unspecified: Secondary | ICD-10-CM | POA: Diagnosis not present

## 2021-04-07 DIAGNOSIS — Z950 Presence of cardiac pacemaker: Secondary | ICD-10-CM

## 2021-04-07 DIAGNOSIS — R29810 Facial weakness: Secondary | ICD-10-CM | POA: Diagnosis present

## 2021-04-07 DIAGNOSIS — R4701 Aphasia: Secondary | ICD-10-CM | POA: Diagnosis present

## 2021-04-07 DIAGNOSIS — Z7982 Long term (current) use of aspirin: Secondary | ICD-10-CM

## 2021-04-07 DIAGNOSIS — Z880 Allergy status to penicillin: Secondary | ICD-10-CM

## 2021-04-07 DIAGNOSIS — I442 Atrioventricular block, complete: Secondary | ICD-10-CM | POA: Diagnosis present

## 2021-04-07 DIAGNOSIS — Z79890 Hormone replacement therapy (postmenopausal): Secondary | ICD-10-CM

## 2021-04-07 DIAGNOSIS — E876 Hypokalemia: Secondary | ICD-10-CM | POA: Diagnosis present

## 2021-04-07 DIAGNOSIS — Z888 Allergy status to other drugs, medicaments and biological substances status: Secondary | ICD-10-CM

## 2021-04-07 DIAGNOSIS — Z79899 Other long term (current) drug therapy: Secondary | ICD-10-CM

## 2021-04-07 DIAGNOSIS — Z87891 Personal history of nicotine dependence: Secondary | ICD-10-CM

## 2021-04-07 DIAGNOSIS — I608 Other nontraumatic subarachnoid hemorrhage: Secondary | ICD-10-CM | POA: Diagnosis present

## 2021-04-07 DIAGNOSIS — I611 Nontraumatic intracerebral hemorrhage in hemisphere, cortical: Secondary | ICD-10-CM | POA: Diagnosis present

## 2021-04-07 LAB — I-STAT ARTERIAL BLOOD GAS, ED
Acid-Base Excess: 3 mmol/L — ABNORMAL HIGH (ref 0.0–2.0)
Bicarbonate: 32.9 mmol/L — ABNORMAL HIGH (ref 20.0–28.0)
Calcium, Ion: 1.3 mmol/L (ref 1.15–1.40)
HCT: 40 % (ref 36.0–46.0)
Hemoglobin: 13.6 g/dL (ref 12.0–15.0)
O2 Saturation: 96 %
Patient temperature: 98
Potassium: 2.7 mmol/L — CL (ref 3.5–5.1)
Sodium: 140 mmol/L (ref 135–145)
TCO2: 35 mmol/L — ABNORMAL HIGH (ref 22–32)
pCO2 arterial: 74.3 mmHg (ref 32.0–48.0)
pH, Arterial: 7.253 — ABNORMAL LOW (ref 7.350–7.450)
pO2, Arterial: 101 mmHg (ref 83.0–108.0)

## 2021-04-07 LAB — CBC
HCT: 43.7 % (ref 36.0–46.0)
Hemoglobin: 13.7 g/dL (ref 12.0–15.0)
MCH: 29.1 pg (ref 26.0–34.0)
MCHC: 31.4 g/dL (ref 30.0–36.0)
MCV: 93 fL (ref 80.0–100.0)
Platelets: 320 10*3/uL (ref 150–400)
RBC: 4.7 MIL/uL (ref 3.87–5.11)
RDW: 13.7 % (ref 11.5–15.5)
WBC: 11.2 10*3/uL — ABNORMAL HIGH (ref 4.0–10.5)
nRBC: 0 % (ref 0.0–0.2)

## 2021-04-07 LAB — COMPREHENSIVE METABOLIC PANEL
ALT: 20 U/L (ref 0–44)
AST: 30 U/L (ref 15–41)
Albumin: 4 g/dL (ref 3.5–5.0)
Alkaline Phosphatase: 57 U/L (ref 38–126)
Anion gap: 11 (ref 5–15)
BUN: 18 mg/dL (ref 8–23)
CO2: 24 mmol/L (ref 22–32)
Calcium: 9.7 mg/dL (ref 8.9–10.3)
Chloride: 104 mmol/L (ref 98–111)
Creatinine, Ser: 0.88 mg/dL (ref 0.44–1.00)
GFR, Estimated: >60 mL/min (ref >60)
Glucose, Bld: 176 mg/dL — ABNORMAL HIGH (ref 70–99)
Potassium: 3.1 mmol/L — ABNORMAL LOW (ref 3.5–5.1)
Sodium: 139 mmol/L (ref 135–145)
Total Bilirubin: 0.7 mg/dL (ref 0.3–1.2)
Total Protein: 7.5 g/dL (ref 6.5–8.1)

## 2021-04-07 LAB — I-STAT CHEM 8, ED
BUN: 19 mg/dL (ref 8–23)
Calcium, Ion: 1.12 mmol/L — ABNORMAL LOW (ref 1.15–1.40)
Chloride: 105 mmol/L (ref 98–111)
Creatinine, Ser: 0.8 mg/dL (ref 0.44–1.00)
Glucose, Bld: 190 mg/dL — ABNORMAL HIGH (ref 70–99)
HCT: 43 % (ref 36.0–46.0)
Hemoglobin: 14.6 g/dL (ref 12.0–15.0)
Potassium: 3.1 mmol/L — ABNORMAL LOW (ref 3.5–5.1)
Sodium: 140 mmol/L (ref 135–145)
TCO2: 27 mmol/L (ref 22–32)

## 2021-04-07 LAB — DIFFERENTIAL
Abs Immature Granulocytes: 0.33 10*3/uL — ABNORMAL HIGH (ref 0.00–0.07)
Basophils Absolute: 0.1 10*3/uL (ref 0.0–0.1)
Basophils Relative: 1 %
Eosinophils Absolute: 0.2 10*3/uL (ref 0.0–0.5)
Eosinophils Relative: 2 %
Immature Granulocytes: 3 %
Lymphocytes Relative: 28 %
Lymphs Abs: 3.1 10*3/uL (ref 0.7–4.0)
Monocytes Absolute: 1.2 10*3/uL — ABNORMAL HIGH (ref 0.1–1.0)
Monocytes Relative: 11 %
Neutro Abs: 6.3 10*3/uL (ref 1.7–7.7)
Neutrophils Relative %: 55 %

## 2021-04-07 LAB — APTT: aPTT: 29 seconds (ref 24–36)

## 2021-04-07 LAB — CBG MONITORING, ED: Glucose-Capillary: 188 mg/dL — ABNORMAL HIGH (ref 70–99)

## 2021-04-07 LAB — PROTIME-INR
INR: 1.1 (ref 0.8–1.2)
Prothrombin Time: 13.9 seconds (ref 11.4–15.2)

## 2021-04-07 LAB — RESP PANEL BY RT-PCR (FLU A&B, COVID) ARPGX2
Influenza A by PCR: NEGATIVE
Influenza B by PCR: NEGATIVE
SARS Coronavirus 2 by RT PCR: NEGATIVE

## 2021-04-07 MED ORDER — IOHEXOL 350 MG/ML SOLN
60.0000 mL | Freq: Once | INTRAVENOUS | Status: AC | PRN
  Administered 2021-04-07: 60 mL via INTRAVENOUS

## 2021-04-07 MED ORDER — ROCURONIUM BROMIDE 50 MG/5ML IV SOLN
INTRAVENOUS | Status: AC | PRN
  Administered 2021-04-07: 80 mg via INTRAVENOUS

## 2021-04-07 MED ORDER — FENTANYL CITRATE PF 50 MCG/ML IJ SOSY
PREFILLED_SYRINGE | INTRAMUSCULAR | Status: AC
  Administered 2021-04-07: 50 ug via INTRAVENOUS
  Filled 2021-04-07: qty 1

## 2021-04-07 MED ORDER — PROPOFOL 1000 MG/100ML IV EMUL
INTRAVENOUS | Status: AC
  Administered 2021-04-07: 20 ug/kg/min via INTRAVENOUS
  Filled 2021-04-07: qty 100

## 2021-04-07 MED ORDER — FENTANYL CITRATE PF 50 MCG/ML IJ SOSY: 50.0000 ug | PREFILLED_SYRINGE | Freq: Once | INTRAMUSCULAR | Status: AC

## 2021-04-07 MED ORDER — CLEVIDIPINE BUTYRATE 0.5 MG/ML IV EMUL
0.0000 mg/h | INTRAVENOUS | Status: DC
  Administered 2021-04-07: 4 mg/h via INTRAVENOUS
  Administered 2021-04-07: 1 mg/h via INTRAVENOUS

## 2021-04-07 MED ORDER — ETOMIDATE 2 MG/ML IV SOLN
INTRAVENOUS | Status: AC | PRN
Start: 2021-04-07 — End: 2021-04-07
  Administered 2021-04-07: 20 mg via INTRAVENOUS

## 2021-04-07 MED ORDER — FENTANYL 2500MCG IN NS 250ML (10MCG/ML) PREMIX INFUSION
25.0000 ug/h | INTRAVENOUS | Status: DC
  Administered 2021-04-08: 50 ug/h via INTRAVENOUS
  Filled 2021-04-07: qty 250

## 2021-04-07 MED ORDER — SODIUM CHLORIDE 0.9% FLUSH: 3.0000 mL | Freq: Once | INTRAVENOUS | Status: DC

## 2021-04-07 MED ORDER — PROPOFOL 1000 MG/100ML IV EMUL
0.0000 ug/kg/min | INTRAVENOUS | Status: DC
  Filled 2021-04-07: qty 100

## 2021-04-07 MED ORDER — METRONIDAZOLE 500 MG/100ML IV SOLN
500.0000 mg | Freq: Three times a day (TID) | INTRAVENOUS | Status: DC
  Administered 2021-04-07: 500 mg via INTRAVENOUS
  Filled 2021-04-07: qty 100

## 2021-04-07 MED ORDER — FENTANYL BOLUS VIA INFUSION
25.0000 ug | INTRAVENOUS | Status: DC | PRN
  Filled 2021-04-07: qty 25

## 2021-04-07 MED ORDER — SODIUM CHLORIDE 0.9 % IV SOLN
1.0000 g | INTRAVENOUS | Status: DC
  Administered 2021-04-07: 1 g via INTRAVENOUS
  Filled 2021-04-07: qty 10

## 2021-04-07 NOTE — ED Triage Notes (Signed)
Pt BIB [facility] from an event [place], daughter noted pt had sudden onset right facial droop and right eye gaze, and left hemiplegia. Pt taken to CT on arrival, alert and answering questions, one episode of vomiting given 4mg  zofran in CT. Initial ED BP 182/92 given 20mg  labetalol in CT.

## 2021-04-07 NOTE — Code Documentation (Signed)
Stroke Response Nurse Documentation Code Documentation  Nicole Watkins is a 81 y.o. female arriving to [facility] ED via [facility] on 2/8 with past medical hx of HTN, CAD with PCI, pacer, HLD, AAA. On aspirin 81 mg daily. Code stroke was activated by EMS.   Patient from home where she was LKW at 2030 and now complaining of Left sided weakness, left facial droop and right gaze .   Stroke team at the bedside on patient arrival. Labs drawn and patient cleared for CT by Dr. Renaye Rakers. Patient to CT with team. NIHSS 24, see documentation for details and code stroke times. Patient with disoriented, right gaze preference , left hemianopia, left facial droop, left arm weakness, left leg weakness, left decreased sensation, dysarthria , and left neglect on exam. The following imaging was completed:  CT, CTA head. Patient is not a candidate for IV Thrombolytic due to [facility].    Bedside handoff with ED RN Carmie Kanner.    Rose Fillers  Rapid Response RN

## 2021-04-07 NOTE — Progress Notes (Signed)
RT NOTE:  Pt transported w/ vent to CT and back to ER without event.

## 2021-04-07 NOTE — ED Provider Notes (Signed)
[facility] EMERGENCY DEPARTMENT Provider Note   CSN: [id number] Arrival date & time: 04/06/2021  2043     History  Chief Complaint  Patient presents with   Code Stroke    Nicole Watkins is a 81 y.o. female presenting with AMS, right sided facial droop, aphasia, onset this evening while out with family.  Arrives as code stroke.  P resen      Home Medications Prior to Admission medications   Medication Sig Start Date End Date Taking? Authorizing Provider  ALPRAZolam (XANAX) 0.25 MG tablet Take 1 tablet (0.25 mg total) by mouth 2 (two) times daily as needed for anxiety. 08/11/17   Roma Schanz R, DO  amLODipine (NORVASC) 5 MG tablet Take 1 tablet (5 mg total) by mouth daily. 07/15/20 11/23/20  Lelon Perla, MD  aspirin 81 MG tablet Take 81 mg by mouth every evening. Reported on 08/10/2015    [provider]  Cholecalciferol 4000 UNITS CAPS Take 4,000 Units by mouth every evening. Reported on 08/10/2015    [provider]  docusate sodium (COLACE) 100 MG capsule Take 100 mg by mouth daily.    [provider]  losartan (COZAAR) 50 MG tablet Take 1.5 tablets (75 mg total) by mouth daily. 11/23/20   Lelon Perla, MD  metoprolol tartrate (LOPRESSOR) 50 MG tablet Take 1/2 tablet in the morning and 1 tablet in the evening 11/23/20   Lelon Perla, MD  nitroGLYCERIN (NITROSTAT) 0.4 MG SL tablet DISSOLVE 1 TAB UNDER TONGUE FOR CHEST PAIN - IF PAIN REMAINS AFTER 5 MIN, CALL 911 AND REPEAT DOSE. MAX 3 TABS IN 15 MINUTES 12/28/18   Lelon Perla, MD  thyroid (ARMOUR) 15 MG tablet Take 1 tablet by mouth daily. 30 mg seven days a week 02/12/20   [provider]      Allergies    Lipitor [atorvastatin], Penicillins, Statins, Ambien [zolpidem tartrate], Crestor [rosuvastatin calcium], Lisinopril, and Pravastatin    Review of Systems   Review of Systems  Physical Exam Updated Vital Signs BP 112/66    Pulse 63    Resp 16     Wt 70.4 kg    SpO2 96%    BMI 26.64 kg/m  Physical Exam Constitutional:      Comments: Ill-appearing, slow speech  HENT:     Head: Normocephalic and atraumatic.  Eyes:     Conjunctiva/sclera: Conjunctivae normal.     Pupils: Pupils are equal, round, and reactive to light.  Cardiovascular:     Rate and Rhythm: Normal rate and regular rhythm.     Pulses: Normal pulses.  Pulmonary:     Effort: Pulmonary effort is normal. No respiratory distress.  Skin:    General: Skin is warm and dry.  Neurological:     Mental Status: She is alert.     Comments: Left sided hemiparesis, right sided strength 4/5, right gaze deviation GCS eyes 4, verbal 4, motor 5 on arrival    ED Results / Procedures / Treatments   Labs (all labs ordered are listed, but only abnormal results are displayed) Labs Reviewed  CBC - Abnormal; Notable for the following components:      Result Value   WBC 11.2 (*)    All other components within normal limits  DIFFERENTIAL - Abnormal; Notable for the following components:   Monocytes Absolute 1.2 (*)    Abs Immature Granulocytes 0.33 (*)    All other components within normal limits  COMPREHENSIVE METABOLIC  PANEL - Abnormal; Notable for the following components:   Potassium 3.1 (*)    Glucose, Bld 176 (*)    All other components within normal limits  I-STAT CHEM 8, ED - Abnormal; Notable for the following components:   Potassium 3.1 (*)    Glucose, Bld 190 (*)    Calcium, Ion 1.12 (*)    All other components within normal limits  CBG MONITORING, ED - Abnormal; Notable for the following components:   Glucose-Capillary 188 (*)    All other components within normal limits  I-STAT ARTERIAL BLOOD GAS, ED - Abnormal; Notable for the following components:   pH, Arterial 7.253 (*)    pCO2 arterial 74.3 (*)    Bicarbonate 32.9 (*)    TCO2 35 (*)    Acid-Base Excess 3.0 (*)    Potassium 2.7 (*)    All other components within normal limits  RESP PANEL BY RT-PCR (FLU  A&B, COVID) ARPGX2  PROTIME-INR  APTT  TRIGLYCERIDES    EKG EKG Interpretation  Date/Time:  Wednesday April 07 2021 21:12:41 EST Ventricular Rate:  73 PR Interval:  234 QRS Duration: 232 QT Interval:  544 QTC Calculation: 600 R Axis:   268 Text Interpretation: Age not entered, assumed to be  81 years old for purpose of ECG interpretation Right and left arm electrode reversal, interpretation assumes no reversal Sinus rhythm Prolonged PR interval Consider left atrial enlargement Nonspecific IVCD with LAD Consider left ventricular hypertrophy Abnormal T, probable ischemia, lateral leads Confirmed by Octaviano Glow [phone]) on 04/16/2021 10:56:54 PM  Radiology DG Chest Portable 1 View  Result Date: 04/06/2021 CLINICAL DATA:  Respiratory failure EXAM: PORTABLE CHEST 1 VIEW COMPARISON:  10/13/2015 FINDINGS: Endotracheal tube seen 15 mm above the carina. Nasogastric tube extends into the upper abdomen beyond the margin of the examination. Pulmonary insufflation is normal and symmetric. Lungs are clear. No pneumothorax or pleural effusion. Cardiac size within normal limits. Left subclavian dual lead pacemaker is unchanged. Pulmonary vascularity is normal. No acute bone abnormality. IMPRESSION: Support tubes in appropriate position. Lungs are clear. Electronically Signed   By: Fidela Salisbury M.D.   On: 03/31/2021 21:51   CT HEAD CODE STROKE WO CONTRAST  Result Date: 04/09/2021 CLINICAL DATA:  Code stroke. Initial evaluation for neuro deficit, stroke. EXAM: CT HEAD WITHOUT CONTRAST TECHNIQUE: Contiguous axial images were obtained from the base of the skull through the vertex without intravenous contrast. RADIATION DOSE REDUCTION: This exam was performed according to the departmental dose-optimization program which includes automated exposure control, adjustment of the mA and/or kV according to patient size and/or use of iterative reconstruction technique. COMPARISON:  Head CT from 06/16/2015. FINDINGS:  Brain: Age-related cerebral atrophy with chronic microvascular ischemic disease. Acute intraparenchymal hemorrhage centered at the posterior right frontotemporal region measures 4.3 x 3.1 x 3.4 cm (estimated volume 23 mL). Minimal surrounding vasogenic edema. Fairly large volume subarachnoid hemorrhage seen throughout the basilar cisterns with extension along the falx. Trace 3 mm right-to-left shift. No hydrocephalus or trapping. No other acute large vessel territory infarct. No visible mass lesion. No extra-axial fluid collection. Vascular: Obscured by subarachnoid hemorrhage. Skull: Scalp soft tissues and calvarium within normal limits. Sinuses/Orbits: Unremarkable Other: None. ASPECTS [facility] Stroke Program Early CT Score) Acute intracranial hemorrhage, does not apply. IMPRESSION: 4.3 x 3.1 x 3.4 cm (estimated volume 23 mL) acute intraparenchymal hemorrhage centered at the posterior right frontotemporal region. Associated large volume subarachnoid hemorrhage throughout the basilar cisterns with extension along the falx. Trace 3 mm  right-to-left shift. No hydrocephalus or trapping. These results were communicated to Dr. Otelia Limes at 9:19 pm on 04/13/2021 by text page via the [facility] messaging system. Electronically Signed   By: Rise Mu M.D.   On: 04/21/2021 21:19   CT ANGIO HEAD CODE STROKE  Result Date: 04/24/2021 CLINICAL DATA:  Initial evaluation for acute intracranial hemorrhage. EXAM: CT ANGIOGRAPHY HEAD TECHNIQUE: Multidetector CT imaging of the head was performed using the standard protocol during bolus administration of intravenous contrast. Multiplanar CT image reconstructions and MIPs were obtained to evaluate the vascular anatomy. RADIATION DOSE REDUCTION: This exam was performed according to the departmental dose-optimization program which includes automated exposure control, adjustment of the mA and/or kV according to patient size and/or use of iterative reconstruction technique. CONTRAST:   18mL OMNIPAQUE IOHEXOL 350 MG/ML SOLN COMPARISON:  Prior noncontrast head CT from earlier the same day. FINDINGS: CTA HEAD Anterior circulation: Atheromatous change about the partially visualized carotid bifurcations without high-grade stenosis. Cervical ICAs tortuous but otherwise patent within the neck. Petrous segments patent bilaterally. Moderate atheromatous change throughout the carotid siphons without high-grade stenosis. A1 segments patent bilaterally. Left A1 hypoplastic. Normal anterior communicating artery complex. Both ACAs somewhat irregular but patent to their distal aspects without stenosis. No M1 stenosis or occlusion. Normal MCA bifurcations. Distal left MCA branches well perfused. Regional mass effect on the right MCA branches due to the intraparenchymal hemorrhage at the right frontotemporal region. The right MCA branches remain patent. There is a saccular outpouching arising from a distal right MCA branch measuring 8 x 7 x 9 mm (series 9, image 73). This is positioned at the superior margin of the hematoma, and is suspicious for a distal aneurysm. Additional 6 mm rounded hyperdensity seen just medially and posteriorly could reflect a second aneurysm component and/or active contrast extravasation (series 7, image 60). Additional blush of contrast positioned just medially and inferiorly consistent with active contrast extravasation/active bleeding (series 7, image 58). Findings represent the source of acute hemorrhage. Posterior circulation: Visualized vertebral arteries tortuous within the neck. Atheromatous irregularity within the V4 segments without high-grade stenosis. Both PICA appear patent. Extensive atheromatous irregularity throughout the basilar artery with associated mild to moderate multifocal narrowing. Superior cerebellar arteries patent bilaterally. Both PCAs primarily supplied via the basilar. 3 mm outpouching at the left P1/P2 junction consistent with a small aneurysm (series 9,  image 122). PCAs irregular but remain well perfused and patent to their distal aspects. Venous sinuses: Not well assessed due to timing of the contrast bolus. Anatomic variants: None significant. Review of the MIP images confirms the above findings. IMPRESSION: 1. 8 x 7 x 9 mm saccular outpouching arising from a distal right MCA branch, consistent with aneurysm. Evidence of adjacent active contrast extravasation/bleeding. The peripheral location is atypical/unusual, but can sometimes be seen in the setting of mycotic aneurysm. Correlation with history and any potential symptomatology recommended. 2. 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 3. Moderate to advanced atheromatous change about the intracranial arterial vasculature, most pronounced at the basilar artery. No other high-grade stenosis. Case discussed by telephone at the time of interpretation on 04/19/2021 at 10:00 pm to provider ER[facility] , who verbally acknowledged these results. Electronically Signed   By: Rise Mu M.D.   On: 04/09/2021 23:00    Procedures .Critical Care Performed by: Terald Sleeper, MD Authorized by: Terald Sleeper, MD   Critical care provider statement:    Critical care time (minutes):  45   Critical care  time was exclusive of:  Separately billable procedures and treating other patients   Critical care was necessary to treat or prevent imminent or life-threatening deterioration of the following conditions:  Respiratory failure and CNS failure or compromise   Critical care was time spent personally by me on the following activities:  Ordering and performing treatments and interventions, ordering and review of laboratory studies, ordering and review of radiographic studies, pulse oximetry, review of old charts, examination of patient and evaluation of patient's response to treatment Comments:     Goals of care discussion with family; discussion with neurology consultant; BP control with  infusion Date/Time: 04/14/2021 10:36 PM Performed by: Terald Sleeper, MD Pre-anesthesia Checklist: Emergency Drugs available, Suction available, Patient being monitored and Patient identified Oxygen Delivery Method: Non-rebreather mask Preoxygenation: Pre-oxygenation with 100% oxygen Induction Type: Rapid sequence Ventilation: Mask ventilation without difficulty Laryngoscope Size: Glidescope and 4 Tube size: 7.5 mm Number of attempts: 1 Airway Equipment and Method: Video-laryngoscopy Placement Confirmation: ETT inserted through vocal cords under direct vision, Positive ETCO2, Breath sounds checked- equal and bilateral and CO2 detector Secured at: 23 cm Tube secured with: ETT holder       Medications Ordered in ED Medications  sodium chloride flush (NS) 0.9 % injection 3 mL (3 mLs Intravenous Not Given 04/26/2021 2126)  clevidipine (CLEVIPREX) infusion 0.5 mg/mL (4 mg/hr Intravenous New Bag/Given 04/13/2021 2226)  fentaNYL in NS (82mcg/ml) infusion-PREMIX (has no administration in time range)  fentaNYL (SUBLIMAZE) bolus via infusion 25 mcg (has no administration in time range)  propofol (DIPRIVAN) 1000 MG/100ML infusion (0 mcg/kg/min  70.4 kg Intravenous Hold 04/26/2021 2224)  metroNIDAZOLE (FLAGYL) IVPB 500 mg (has no administration in time range)  cefTRIAXone (ROCEPHIN) 1 g in sodium chloride 0.9 % 100 mL IVPB (1 g Intravenous New Bag/Given 04/02/2021 2244)  etomidate (AMIDATE) injection (20 mg Intravenous Given 04/25/2021 2132)  rocuronium (ZEMURON) injection (80 mg Intravenous Given 04/12/2021 2133)  iohexol (OMNIPAQUE) 350 MG/ML injection 60 mL (60 mLs Intravenous Contrast Given 04/05/2021 2156)  fentaNYL (SUBLIMAZE) injection 50 mcg (50 mcg Intravenous Given 04/10/2021 2143)    ED Course/ Medical Decision Making/ A&P Clinical Course as of 04/17/2021 2341  Wed Apr 07, 2021  2137 Intubated, family at bedside - going for CTA now [MT]  2218 Neurologist in discussion with neurosurgery [MT]   2315 Notified by Dr Otelia Limes neurologist that he had spoken to Dr Franky Macho, neurosurgery, and stat consult was placed. Patient is signed out pending neurosurgery consultation, anticipating ICU admission.  Blood pressure controlled to goal on clevidipine; patient sedated on ventilator. Family at bedside awaiting awaiting specialist consult. [MT]    Clinical Course User Index [MT] Tabria Steines, Kermit Balo, MD                           Medical Decision Making Amount and/or Complexity of Data Reviewed Labs: ordered. Radiology: ordered.  Risk Prescription drug management.   This patient presents to the ED with concern for CVA/stroke.  This involves an extensive number of treatment options, and is a complaint that carries with it a high risk of complications and morbidity.  The differential diagnosis includes CVA/stroke/brain hemorrhage/other  Upon arrival the patient was immediately assessed by myself and the neurologist Dr. Otelia Limes.  She was verbalizing and following basic commands, with GCS 13 on arrival,, and appeared initially to be protecting her airway.  She was taken to CT, where 20 mg IV labetalol and 4 mg  zofran was ordered for hypertension and nausea.  She vomited at CT, and appeared to have worsening mental status.  CT showed large volume subarachnoid bleeding with 3 mm midline shift. She was brought back to trauma bay for emergent intubation and airway protection.    At this time her family (daughter, grand-daughter) were present, and a discussion was had between the family and the neurologist and myself regarding need for rapid intubation and return for CTA to evaluate for possible aneurysm. Family was made aware of the need for timely diagnosis. There was a brief delay in intubation as family members were considering goals of care (intubation vs palliative), and requested time to say goodbye's while patient was awake.   Afterwards, her family gave permission for intubation, which was rapidly  performed, and patient was taken immediately back for CTA.  Dr. Cheral Marker placed a stat consult with the neurosurgeon on call. I was present for the discussion between Dr Cheral Marker and her daughter regarding the patient's overall poor prognosis, Hunt & Hess score of 4, mortality of 80%.   *  Additional history obtained from patient's mother, paramedics on arrival  I ordered and personally interpreted labs.  There is mild hypokalemia but otherwise no significant emergent findings on blood tests.  I ordered imaging studies including CTH, CTA head I independently visualized and interpreted imaging which showed SAH, right MCA regional hemorrhage; concern for aneurysm on CTA - radiologist questions possibility of mycotic aneurysm.  No history or symptoms of infection at this time. I agree with the radiologist interpretation  The patient was maintained on a cardiac monitor.  I personally viewed and interpreted the cardiac monitored which showed an underlying rhythm of: V-paced rhythm  Per my interpretation the patient's ECG shows V paced rhythm  I ordered medication including labetalol, clevedipine for BP control, zofran for nausea, RSI medications I have reviewed the patients home medicines and have made adjustments as needed  Test Considered:  Doubt acs/pe         Final Clinical Impression(s) / ED Diagnoses Final diagnoses:  None    Rx / DC Orders ED Discharge Orders     None         Edilia Ghuman, Carola Rhine, MD 04/16/2021 2359

## 2021-04-07 NOTE — ED Notes (Signed)
Dr. Cabbell at bedside 

## 2021-04-07 NOTE — Consult Note (Addendum)
Neurohospitalist Consult Note    Chief Complaint: Acute onset of aphasia and left sided weakness  HPI: Nicole Watkins is an 81 y.o. female with a PMHx of CAD s/p stenting, complete heart block, thyroid disease, HLD, HTN and pacemaker placement, presenting via EMS as a Code Stroke for right hemiplegia, facial droop and aphasia. LKN was 8:30 PM while patient was seated with family at the [facility] watching a performance. Family member first noticed left facial droop, then right sided weakness. On arrival by EMS the patient had right gaze deviation, continued dense aphasia and right sided hemiplegia. BP in the field was 250/124. On arrival to the ED she continued to exhibit the above deficits and was with decreased level of alertness. Shortly before STAT CT head, she began vomiting for which Zofran was administered. CT head reveals a large right parenchymal hemorrhage with extensive subarachnoid blood.   Past Medical History:  Diagnosis Date   Anxiety    Arthritis    CAD (coronary artery disease)    CHB (complete heart block) (HCC)    a. s/p PPM on 09/29/15   GERD (gastroesophageal reflux disease)    Hyperlipidemia    Hypertension    LBBB (left bundle branch block)    Status post placement of cardiac pacemaker    a. 09/2015: Medtronic Advisa MRI model A2DR01 (serial number [id number]) pacemaker   Thyroid-related proptosis     Past Surgical History:  Procedure Laterality Date   CARDIAC SURGERY     stent placed 03/04/13   EP IMPLANTABLE DEVICE N/A 06/17/2015   Procedure: Loop Recorder Insertion;  Surgeon: [name], MD;  Location: [facility];  Service: Cardiovascular;  Laterality: N/A;   EP IMPLANTABLE DEVICE N/A 09/29/2015   Procedure: Pacemaker Implant;  Surgeon: [name], MD;  Location: [facility];  Service: Cardiovascular;  Laterality: N/A;   No family history      Family History  Problem Relation Age of Onset   Arthritis Mother    Hyperlipidemia Mother     Hypertension Mother    Hypothyroidism Mother    Uterine cancer Maternal Grandmother    Social History:  reports that she has quit smoking. She has never used smokeless tobacco. She reports that she does not drink alcohol and does not use drugs.  Allergies:  Allergies  Allergen Reactions   Lipitor [Atorvastatin] Other (See Comments)    HA, depression, vomiting, headache   Penicillins Anaphylaxis and Swelling    Has patient had a PCN reaction causing immediate rash, facial/tongue/throat swelling, SOB or lightheadedness with hypotension: Yes Has patient had a PCN reaction causing severe rash involving mucus membranes or skin necrosis: No Has patient had a PCN reaction that required hospitalization No Has patient had a PCN reaction occurring within the last 10 years: No If all of the above answers are "NO", then may proceed with Cephalosporin use.    Statins Other (See Comments)    UTI   Ambien [Zolpidem Tartrate] Other (See Comments)    Per patient's daughter-patient becomes delirious   Crestor [Rosuvastatin Calcium] Nausea And Vomiting    Depression, heading   Lisinopril Cough   Pravastatin Nausea Only    NAUSEA, VAGINAL IRRITATION, MOOD CHANGES    Outpatient Medications: No current facility-administered medications on file prior to encounter.   Current Outpatient Medications on File Prior to Encounter  Medication Sig Dispense Refill   ALPRAZolam (XANAX) 0.25 MG tablet Take 1 tablet (0.25 mg total) by mouth 2 (two) times  daily as needed for anxiety. 60 tablet 1   amLODipine (NORVASC) 5 MG tablet Take 1 tablet (5 mg total) by mouth daily. 180 tablet 3   aspirin 81 MG tablet Take 81 mg by mouth every evening. Reported on 08/10/2015     Cholecalciferol 4000 UNITS CAPS Take 4,000 Units by mouth every evening. Reported on 08/10/2015     docusate sodium (COLACE) 100 MG capsule Take 100 mg by mouth daily.     losartan (COZAAR) 50 MG tablet Take 1.5 tablets (75 mg total) by mouth daily. 50  tablet 6   metoprolol tartrate (LOPRESSOR) 50 MG tablet Take 1/2 tablet in the morning and 1 tablet in the evening 180 tablet 3   nitroGLYCERIN (NITROSTAT) 0.4 MG SL tablet DISSOLVE 1 TAB UNDER TONGUE FOR CHEST PAIN - IF PAIN REMAINS AFTER 5 MIN, CALL 911 AND REPEAT DOSE. MAX 3 TABS IN 15 MINUTES 25 tablet 1   thyroid (ARMOUR) 15 MG tablet Take 1 tablet by mouth daily. 30 mg seven days a week      ROS: Unable to obtain due to aphasia.   Physical Examination: There were no vitals taken for this visit.  HEENT-  [place]/AT  Lungs - Gross rhonchi are audible Extremities - Warm and well perfused  Neurologic Examination: Mental Status: Lethargic. Will follow request to squeeze hand with her right hand and will give her name with severely dysarthric speech. Also able to state "aspirin" when asked about medication. Sticks out tongue to command.   Cranial Nerves: II:  No blink to threat on the left. Will gaze towards right sided stimuli. PERRL 4 mm >> 3 mm III,IV, VI: Forced right gaze deviation conjugately V,VII: Left facial droop.  VIII: Hearing intact to voice IX,X: Unable to assess XI: Head rotated to the right XII: Unable to assess Motor/Sensory: RUE and RLE move normally in response to commands and stimuli LUE and LLE flaccid with no movement to noxious stimuli Deep Tendon Reflexes:  Not assessed in the context of acuity of presentation with impending intubation Cerebellar: No gross ataxia of RUE Gait: Unable to assess    Results for orders placed or performed during the hospital encounter of 04/27/2021 (from the past 48 hour(s))  CBG monitoring, ED     Status: Abnormal   Collection Time: 04/16/2021  8:45 PM  Result Value Ref Range   Glucose-Capillary 188 (H) 70 - 99 mg/dL    Comment: Glucose reference range applies only to samples taken after fasting for at least 8 hours.  I-stat chem 8, ED     Status: Abnormal   Collection Time: 04/12/2021  8:52 PM  Result Value Ref Range   Sodium 140  135 - 145 mmol/L   Potassium 3.1 (L) 3.5 - 5.1 mmol/L   Chloride 105 98 - 111 mmol/L   BUN 19 8 - 23 mg/dL   Creatinine, Ser 4.28 0.44 - 1.00 mg/dL   Glucose, Bld 768 (H) 70 - 99 mg/dL    Comment: Glucose reference range applies only to samples taken after fasting for at least 8 hours.   Calcium, Ion 1.12 (L) 1.15 - 1.40 mmol/L   TCO2 27 22 - 32 mmol/L   Hemoglobin 14.6 12.0 - 15.0 g/dL   HCT 11.5 72.6 - 20.3 %   Assessment: 81 year old female presenting with acute onset of left hemiplegia, left facial droop, forced rightward gaze deviation and aphasia. CT head reveals acute right cerebral hemisphere intraparenchymal blood and extensive infratentorial and supratentorial  subarachnoid hemorrhage.  1. 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 Will need CTA to verify 2. CT head official report is pending. 3. Hunt and Hess score: 4. This corresponds to an estimated 80% mortality rate. 4. ICH score: 3. This corresponds to an estimated 72% mortality rate.   5. Patient required emergent intubation for airway protection in the setting of vomiting prior to being able to obtain CTA of head.   Plan: 1. STAT CTA of head after intubation.  2. Neurosurgery has been called by Dr. Renaye Rakers. Possible options include aneurysm coiling versus aneurysm clipping if she is determined to be a candidate.  3. CT scan and clinical condition discussed with patient's daughter, who is an ICU nurse at [employer]. She stated that she wants full medical management for now. She will discuss further with family.  4. BP management with clevidipine drip. Goal SBP of < 160 for now 5. Nimodipine per tube to reduce the risk of cerebral vasospasm 6. IVF 7. No antiplatelet medications or anticoagulants. DVT prophylaxis with SCDs 8. Will need to be admitted to the ICU under CCM or Neurosurgery.  9. Frequent neuro checks  Addendum: - STAT CTA head reveals a large distal right MCA aneurysm  adjacent to the parenchymal component of the bleed, with some contrast extravasation into the parenchymal hematoma. The aneurysm measures 8 mm in maximum diameter and has a saccular morphology. 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  - Dr. Franky Macho of Neurosurgery has been paged STAT.  - Dr. Conchita Paris unavailable at cellphone number listed in Amion. Neurosurgery answering service unable to page Dr. Conchita Paris.   85 minutes spent in the emergent neurological evaluation and management of this critically ill patient.   Electronically signed: Dr. Caryl Pina 2021/05/06, 8:55 PM

## 2021-04-07 NOTE — ED Provider Notes (Signed)
°  Provider Note MRN:  [id number]  Arrival date & time: 04/05/2021    ED Course and Medical Decision Making  Assumed care from Dr. Renaye Rakers at shift change.  Patient presenting with strokelike symptoms, altered mental status requiring intubation, CT revealing likely aneurysmal bleeding.  Neurology and neurosurgery are consulted and Dr. Franky Macho is on the way to come speak with the patient and patient's daughter.  Poor prognosis.  Procedures  Final Clinical Impressions(s) / ED Diagnoses  No diagnosis found.  ED Discharge Orders     None       Discharge Instructions   None     Elmer Sow. Pilar Plate, MD [facility] Emergency Medicine [facility] [email or web address]    Sabas Sous, MD 04/27/21 [phone]

## 2021-04-08 DIAGNOSIS — Z20822 Contact with and (suspected) exposure to covid-19: Secondary | ICD-10-CM | POA: Diagnosis present

## 2021-04-08 DIAGNOSIS — R4701 Aphasia: Secondary | ICD-10-CM | POA: Diagnosis present

## 2021-04-08 DIAGNOSIS — R29810 Facial weakness: Secondary | ICD-10-CM | POA: Diagnosis present

## 2021-04-08 DIAGNOSIS — Z8249 Family history of ischemic heart disease and other diseases of the circulatory system: Secondary | ICD-10-CM | POA: Diagnosis not present

## 2021-04-08 DIAGNOSIS — G8194 Hemiplegia, unspecified affecting left nondominant side: Secondary | ICD-10-CM | POA: Diagnosis present

## 2021-04-08 DIAGNOSIS — Z515 Encounter for palliative care: Secondary | ICD-10-CM | POA: Diagnosis not present

## 2021-04-08 DIAGNOSIS — Z87891 Personal history of nicotine dependence: Secondary | ICD-10-CM | POA: Diagnosis not present

## 2021-04-08 DIAGNOSIS — E876 Hypokalemia: Secondary | ICD-10-CM | POA: Diagnosis present

## 2021-04-08 DIAGNOSIS — Z888 Allergy status to other drugs, medicaments and biological substances status: Secondary | ICD-10-CM | POA: Diagnosis not present

## 2021-04-08 DIAGNOSIS — I608 Other nontraumatic subarachnoid hemorrhage: Secondary | ICD-10-CM

## 2021-04-08 DIAGNOSIS — I442 Atrioventricular block, complete: Secondary | ICD-10-CM | POA: Diagnosis present

## 2021-04-08 DIAGNOSIS — I10 Essential (primary) hypertension: Secondary | ICD-10-CM | POA: Diagnosis present

## 2021-04-08 DIAGNOSIS — G935 Compression of brain: Secondary | ICD-10-CM | POA: Diagnosis present

## 2021-04-08 DIAGNOSIS — I609 Nontraumatic subarachnoid hemorrhage, unspecified: Secondary | ICD-10-CM | POA: Diagnosis present

## 2021-04-08 DIAGNOSIS — I6011 Nontraumatic subarachnoid hemorrhage from right middle cerebral artery: Secondary | ICD-10-CM | POA: Diagnosis present

## 2021-04-08 DIAGNOSIS — I611 Nontraumatic intracerebral hemorrhage in hemisphere, cortical: Secondary | ICD-10-CM | POA: Diagnosis present

## 2021-04-08 DIAGNOSIS — J9601 Acute respiratory failure with hypoxia: Secondary | ICD-10-CM | POA: Diagnosis present

## 2021-04-08 DIAGNOSIS — M1990 Unspecified osteoarthritis, unspecified site: Secondary | ICD-10-CM | POA: Diagnosis present

## 2021-04-08 DIAGNOSIS — R471 Dysarthria and anarthria: Secondary | ICD-10-CM | POA: Diagnosis present

## 2021-04-08 DIAGNOSIS — Z950 Presence of cardiac pacemaker: Secondary | ICD-10-CM | POA: Diagnosis not present

## 2021-04-08 DIAGNOSIS — I2510 Atherosclerotic heart disease of native coronary artery without angina pectoris: Secondary | ICD-10-CM | POA: Diagnosis present

## 2021-04-08 DIAGNOSIS — K219 Gastro-esophageal reflux disease without esophagitis: Secondary | ICD-10-CM | POA: Diagnosis present

## 2021-04-08 DIAGNOSIS — E785 Hyperlipidemia, unspecified: Secondary | ICD-10-CM | POA: Diagnosis present

## 2021-04-08 DIAGNOSIS — Z880 Allergy status to penicillin: Secondary | ICD-10-CM | POA: Diagnosis not present

## 2021-04-08 DIAGNOSIS — Z955 Presence of coronary angioplasty implant and graft: Secondary | ICD-10-CM | POA: Diagnosis not present

## 2021-04-08 DIAGNOSIS — F419 Anxiety disorder, unspecified: Secondary | ICD-10-CM | POA: Diagnosis present

## 2021-04-08 LAB — MRSA NEXT GEN BY PCR, NASAL: MRSA by PCR Next Gen: NOT DETECTED

## 2021-04-08 MED ORDER — MORPHINE SULFATE (PF) 4 MG/ML IV SOLN
INTRAVENOUS | Status: AC
  Administered 2021-04-08: 4 mg
  Filled 2021-04-08: qty 1

## 2021-04-08 MED ORDER — ACETAMINOPHEN 160 MG/5ML PO SOLN: 650.0000 mg | ORAL | Status: DC | PRN

## 2021-04-08 MED ORDER — MORPHINE SULFATE (PF) 4 MG/ML IV SOLN
INTRAVENOUS | Status: AC
  Filled 2021-04-08: qty 1

## 2021-04-08 MED ORDER — LEVETIRACETAM IN NACL 500 MG/100ML IV SOLN
500.0000 mg | Freq: Two times a day (BID) | INTRAVENOUS | Status: DC
  Administered 2021-04-08: 500 mg via INTRAVENOUS
  Filled 2021-04-08 (×2): qty 100

## 2021-04-08 MED ORDER — CHLORHEXIDINE GLUCONATE 0.12% ORAL RINSE (MEDLINE KIT): 15.0000 mL | Freq: Two times a day (BID) | OROMUCOSAL | Status: DC

## 2021-04-08 MED ORDER — GLYCOPYRROLATE 0.2 MG/ML IJ SOLN: 0.1000 mg | INTRAMUSCULAR | Status: DC

## 2021-04-08 MED ORDER — LORAZEPAM 2 MG/ML IJ SOLN: 1.0000 mg | INTRAMUSCULAR | Status: DC | PRN

## 2021-04-08 MED ORDER — GLYCOPYRROLATE 0.2 MG/ML IJ SOLN
INTRAMUSCULAR | Status: AC
  Filled 2021-04-08: qty 1

## 2021-04-08 MED ORDER — MORPHINE SULFATE (PF) 4 MG/ML IV SOLN: 4.0000 mg | INTRAVENOUS | Status: DC | PRN

## 2021-04-08 MED ORDER — LORAZEPAM 2 MG/ML IJ SOLN
INTRAMUSCULAR | Status: AC
  Administered 2021-04-08: 2 mg
  Filled 2021-04-08: qty 1

## 2021-04-08 MED ORDER — CHLORHEXIDINE GLUCONATE CLOTH 2 % EX PADS: 6.0000 | MEDICATED_PAD | Freq: Every day | CUTANEOUS | Status: DC

## 2021-04-08 MED ORDER — LORAZEPAM 2 MG/ML IJ SOLN
INTRAMUSCULAR | Status: AC
  Filled 2021-04-08: qty 1

## 2021-04-08 MED ORDER — ORAL CARE MOUTH RINSE
15.0000 mL | OROMUCOSAL | Status: DC
  Administered 2021-04-08 (×2): 15 mL via OROMUCOSAL

## 2021-04-08 MED ORDER — NIMODIPINE 30 MG PO CAPS
60.0000 mg | ORAL_CAPSULE | ORAL | Status: DC
  Filled 2021-04-08 (×3): qty 2

## 2021-04-08 MED ORDER — SODIUM CHLORIDE 0.9 % IV SOLN: INTRAVENOUS | Status: DC

## 2021-04-08 MED ORDER — ACETAMINOPHEN 325 MG PO TABS: 650.0000 mg | ORAL_TABLET | ORAL | Status: DC | PRN

## 2021-04-08 MED ORDER — ACETAMINOPHEN 650 MG RE SUPP: 650.0000 mg | RECTAL | Status: DC | PRN

## 2021-04-08 MED ORDER — PANTOPRAZOLE 2 MG/ML SUSPENSION: 40.0000 mg | Freq: Every day | ORAL | Status: DC

## 2021-04-08 MED ORDER — NIMODIPINE 6 MG/ML PO SOLN
60.0000 mg | ORAL | Status: DC
  Administered 2021-04-08: 60 mg
  Filled 2021-04-08 (×4): qty 10

## 2021-04-08 MED ORDER — PANTOPRAZOLE SODIUM 40 MG PO TBEC: 40.0000 mg | DELAYED_RELEASE_TABLET | Freq: Every day | ORAL | Status: DC

## 2021-04-28 NOTE — Progress Notes (Signed)
RT NOTE:  Pt transported to CT w/vent without event.

## 2021-04-28 NOTE — Progress Notes (Addendum)
Pt was admitted during downtime. Family decided to transition to comfort care around 0330. Verbal orders for Morphine, Robinul, ativan by Dr. Warrick Parisian.  This RN and Megan, RN both auscultated heart and lungs sounds. No heart or lungs sound heard. Time of death 70. Family at bedside. MD aware. Wasted Fentanyl and 83ml of Propofol with Aundra Millet, Charity fundraiser.

## 2021-04-28 NOTE — Discharge Summary (Signed)
Physician Discharge Summary  Patient ID: Nicole Watkins MRN: [id number] DOB/AGE: 1940-07-29 81 y.o.  Admit date: 04-10-2021 Discharge date: 04/14/2021  Admission Diagnoses:subarachnoid hemorrhage, right temporal intracerebral hematoma.  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 Her exam did not improve, I did not feel she would survive the hemorrhage. She expired within 24 hours in the ICU.   Treatments: None  Discharge Exam: Blood pressure (!) 57/38, pulse (!) 0, temperature (!) 95.2 F (35.1 C), resp. rate (!) 0, weight 70.4 kg, SpO2 (!) 0 %. Brain death, cardiovascular collapse  Disposition:  * No surgery found *  Allergies as of 04/11/2021       Reactions   Lipitor [atorvastatin] Other (See Comments)   HA, depression, vomiting, headache   Penicillins Anaphylaxis, Swelling   Has patient had a PCN reaction causing immediate rash, facial/tongue/throat swelling, SOB or lightheadedness with hypotension: Yes Has patient had a PCN reaction causing severe rash involving mucus membranes or skin necrosis: No Has patient had a PCN reaction that required hospitalization No Has patient had a PCN reaction occurring within the last 10 years: No If all of the above answers are "NO", then may proceed with Cephalosporin use.   Statins Other (See Comments)   UTI   Ambien [zolpidem Tartrate] Other (See Comments)   Per patient's daughter-patient becomes delirious   Crestor [rosuvastatin Calcium] Nausea And Vomiting   Depression, heading   Lisinopril Cough   Pravastatin Nausea Only   NAUSEA, VAGINAL IRRITATION, MOOD CHANGES        Medication List     ASK your doctor about these medications    ALPRAZolam 0.25 MG tablet Commonly known as: XANAX Take 1 tablet (0.25 mg total) by  mouth 2 (two) times daily as needed for anxiety.   amLODipine 5 MG tablet Commonly known as: NORVASC Take 1 tablet (5 mg total) by mouth daily.   aspirin 81 MG tablet Take 81 mg by mouth every evening. Reported on 08/10/2015   Cholecalciferol 100 MCG (4000 UT) Caps Take 4,000 Units by mouth every evening. Reported on 08/10/2015   docusate sodium 100 MG capsule Commonly known as: COLACE Take 100 mg by mouth daily.   losartan 50 MG tablet Commonly known as: COZAAR Take 1.5 tablets (75 mg total) by mouth daily.   metoprolol tartrate 50 MG tablet Commonly known as: LOPRESSOR Take 1/2 tablet in the morning and 1 tablet in the evening   nitroGLYCERIN 0.4 MG SL tablet Commonly known as: NITROSTAT DISSOLVE 1 TAB UNDER TONGUE FOR CHEST PAIN - IF PAIN REMAINS AFTER 5 MIN, CALL 911 AND REPEAT DOSE. MAX 3 TABS IN 15 MINUTES   thyroid 15 MG tablet Commonly known as: ARMOUR Take 1 tablet by mouth daily. 30 mg seven days a week         Signed: Coletta Memos 04/14/2021, 8:09 PM

## 2021-04-28 NOTE — Consult Note (Signed)
NAME:  Nicole BorsJuliette Hartzell, MRN:  [id number], DOB:  Nov 14, 1940, LOS: 0 ADMISSION DATE:  04/21/2021 CONSULTATION DATE: 04/20/2021 REFERRING MD:  Franky Machoabbell - NSGY CHIEF COMPLAINT:  Vent management in acute IPH   History of Present Illness:  Ms. Nicole Watkins is seen in consultation at the request of Dr. Franky Machoabbell (NSGY) for assistance with ventilator and hemodynamic management.  81 year old woman who [facility] with acute onset of neurologic deficits concerning for stroke.  Initial CT Head demonstrated 4.3 x 3.1 x 3.4 cm acute intraparenchymal hemorrhage of the posterior right frontotemporal region and large volume SAH; 3 mm right to left shift noted.  At the time of presentation patient's GCS was 13 but rapidly deteriorated to GCS 3-4.  Neurosurgery was consulted.  At that point repeat head CT was completed as well as CT angio.  CT angio was demonstrated 8 x 7 x 9 mm outpouching from distal right MCA consistent with aneurysm.  Repeat CT head demonstrated catastrophic progression of right frontotemporal intraparenchymal hemorrhage with increase in size to 5.7 x 8.7 x 4.6 cm (114 mL).  ICH score 4 consistent with 97% mortality.  Unfortunate situation, per neurosurgery no intervention available.  Patient was transferred to 4N ICU for further care; PCCM consulted for ventilator management in the setting of acute IPH.  Pertinent Medical History:   Past Medical History:  Diagnosis Date   Anxiety    Arthritis    CAD (coronary artery disease)    CHB (complete heart block) (HCC)    a. s/p PPM on 09/29/15   GERD (gastroesophageal reflux disease)    Hyperlipidemia    Hypertension    LBBB (left bundle branch block)    Status post placement of cardiac pacemaker    a. 09/2015: Medtronic Advisa MRI model A2DR01 (serial number [id number]) pacemaker   Thyroid-related proptosis    Significant Hospital Events: Including procedures, antibiotic start and stop dates in addition to other pertinent events   2/8  -[facility] with acute onset of neurologic symptoms, CT head demonstrating acute right frontoparietal region IPH. CTA with ruptured aneurysm. Repeat CT head with catastrophic progression of bleed.  Interim History / Subjective:  PCCM consulted for vent management  Objective:  Blood pressure (!) 57/38, pulse (!) 0, temperature (!) 95.2 F (35.1 C), resp. rate (!) 0, weight 70.4 kg, SpO2 (!) 0 %.    Vent Mode: PRVC FiO2 (%):  [50 %-60 %] 50 % Set Rate:  [12 bmp-20 bmp] 20 bmp Vt Set:  [470 mL] 470 mL PEEP:  [5 cmH20] 5 cmH20 Plateau Pressure:  [15 cmH20] 15 cmH20   Intake/Output Summary (Last 24 hours) at 04/09/2021 0650 Last data filed at 04/03/2021 0600 Gross per 24 hour  Intake 359.35 ml  Output --  Net 359.35 ml   Filed Weights   04/27/2021 2103  Weight: 70.4 kg   Physical Examination: General: Acutely ill-appearing elderly woman in NAD. Intubated, sedated. HEENT: [place]/AT, anicteric sclera, PERRL, moist mucous membranes. ETT in place. Neuro: Comatose. Does not respond to verbal, tactile or noxious stimuli. Not following commands. No spontaneous movement of extremities noted. +Corneal, no cough/gag. CV: RRR, no m/g/r. PULM: Breathing even and unlabored on vent (PEEP 5, FiO2 40%). Lung fields CTAB, slightly diminished at bases. GI: Soft, nontender, nondistended. Normoactive bowel sounds. Extremities: No LE edema noted. Skin: Warm/dry, no rashes.  Resolved Hospital Problem List:    Assessment & Plan:   Ms. Nicole Watkins is seen in consultation at the request of  Dr. Franky Macho (NSGY) for assistance with ventilator and hemodynamic management.  81 year old woman who presented to [facility] ED 2/8 with acute onset of neurologic deficits concerning for stroke.  Initial CT Head demonstrated 4.3 x 3.1 x 3.4 cm acute intraparenchymal hemorrhage of the posterior right frontotemporal region and large volume SAH; 3 mm right to left shift noted.  At the time of presentation patient's GCS was 13 but  rapidly deteriorated to GCS 3-4.  Neurosurgery was consulted.  At that point repeat head CT was completed as well as CT angio.  CT angio was demonstrated 8 x 7 x 9 mm outpouching from distal right MCA consistent with aneurysm.  Repeat CT head demonstrated catastrophic progression of right frontotemporal intraparenchymal hemorrhage with increase in size to 5.7 x 8.7 x 4.6 cm (114 mL).  ICH score 4 consistent with 97% mortality.  Unfortunate situation, per neurosurgery no intervention available.  Acute hypoxic respiratory failure - Continue full vent support (4-8cc/kg IBW) - Wean FiO2 for O2 sat > 90% - WUA/SBT precluded by mental status, unlikely to improve given catastrophic IPH/SAH - VAP bundle - Pulmonary hygiene - PAD protocol for sedation: Propofol and Fentanyl for goal RASS 0 to -1, weaning sedation as able to allow for accurate neurologic examination  Acute intraparenchymal hemorrhage, R frontotemporal region likely secondary to ruptured aneurysm Extensive subarachnoid hemorrhage CT/CTA completed (as above). Neurology and Neurosurgery consulted. Unfortunately, bleed not amenable to intervention - catastrophic bleed with ICH score 4 correlating with 97% mortality. - NSGY primary - No role for NSG intervention - Likely transition to comfort care after family has had time for visitation  PCCM will remain available as needed.  Best Practice: (right click and "Reselect all SmartList Selections" daily)   Per Primary Team  Labs:  CBC: Recent Labs  Lab 04/26/2021 2047 04/24/2021 2052 04/16/2021 2212  WBC 11.2*  --   --   NEUTROABS 6.3  --   --   HGB 13.7 14.6 13.6  HCT 43.7 43.0 40.0  MCV 93.0  --   --   PLT 320  --   --    Basic Metabolic Panel: Recent Labs  Lab 04/26/2021 2047 04/10/2021 2052 04/18/2021 2212  NA 139 140 140  K 3.1* 3.1* 2.7*  CL 104 105  --   CO2 24  --   --   GLUCOSE 176* 190*  --   BUN 18 19  --   CREATININE 0.88 0.80  --   CALCIUM 9.7  --   --     GFR: Estimated Creatinine Clearance: 54 mL/min (by C-G formula based on SCr of 0.8 mg/dL). Recent Labs  Lab 04/23/2021 2047  WBC 11.2*   Liver Function Tests: Recent Labs  Lab 04/20/2021 2047  AST 30  ALT 20  ALKPHOS 57  BILITOT 0.7  PROT 7.5  ALBUMIN 4.0   No results for input(s): LIPASE, AMYLASE in the last 168 hours. No results for input(s): AMMONIA in the last 168 hours.  ABG:    Component Value Date/Time   PHART 7.253 (L) 04/03/2021 2212   PCO2ART 74.3 (HH) 04/05/2021 2212   PO2ART 101 04/06/2021 2212   HCO3 32.9 (H) 04/19/2021 2212   TCO2 35 (H) 03/31/2021 2212   O2SAT 96.0 04/23/2021 2212    Coagulation Profile: Recent Labs  Lab 04/24/2021 2047  INR 1.1   Cardiac Enzymes: No results for input(s): CKTOTAL, CKMB, CKMBINDEX, TROPONINI in the last 168 hours.  HbA1C: No results found for: HGBA1C  CBG: Recent  Labs  Lab 05/06/2021 2045  GLUCAP 188*   Review of Systems:   Patient is encephalopathic and/or intubated. Therefore history has been obtained from chart review.   Past Medical History:  She,  has a past medical history of Anxiety, Arthritis, CAD (coronary artery disease), CHB (complete heart block) (HCC), GERD (gastroesophageal reflux disease), Hyperlipidemia, Hypertension, LBBB (left bundle branch block), Status post placement of cardiac pacemaker, and Thyroid-related proptosis.   Surgical History:   Past Surgical History:  Procedure Laterality Date   CARDIAC SURGERY     stent placed 03/04/13   EP IMPLANTABLE DEVICE N/A 06/17/2015   Procedure: Loop Recorder Insertion;  Surgeon: [name], MD;  Location: [facility] INVASIVE CV LAB;  Service: Cardiovascular;  Laterality: N/A;   EP IMPLANTABLE DEVICE N/A 09/29/2015   Procedure: Pacemaker Implant;  Surgeon: [name], MD;  Location: [facility] INVASIVE CV LAB;  Service: Cardiovascular;  Laterality: N/A;   No family history      Social History:   reports that she has quit smoking. She has never used smokeless  tobacco. She reports that she does not drink alcohol and does not use drugs.   Family History:  Her family history includes Arthritis in her mother; Hyperlipidemia in her mother; Hypertension in her mother; Hypothyroidism in her mother; Uterine cancer in her maternal grandmother.   Allergies: Allergies  Allergen Reactions   Lipitor [Atorvastatin] Other (See Comments)    HA, depression, vomiting, headache   Penicillins Anaphylaxis and Swelling    Has patient had a PCN reaction causing immediate rash, facial/tongue/throat swelling, SOB or lightheadedness with hypotension: Yes Has patient had a PCN reaction causing severe rash involving mucus membranes or skin necrosis: No Has patient had a PCN reaction that required hospitalization No Has patient had a PCN reaction occurring within the last 10 years: No If all of the above answers are "NO", then may proceed with Cephalosporin use.    Statins Other (See Comments)    UTI   Ambien [Zolpidem Tartrate] Other (See Comments)    Per patient's daughter-patient becomes delirious   Crestor [Rosuvastatin Calcium] Nausea And Vomiting    Depression, heading   Lisinopril Cough   Pravastatin Nausea Only    NAUSEA, VAGINAL IRRITATION, MOOD CHANGES    Home Medications: Prior to Admission medications   Medication Sig Start Date End Date Taking? Authorizing Provider  ALPRAZolam (XANAX) 0.25 MG tablet Take 1 tablet (0.25 mg total) by mouth 2 (two) times daily as needed for anxiety. 08/11/17   Seabron Spates R, DO  amLODipine (NORVASC) 5 MG tablet Take 1 tablet (5 mg total) by mouth daily. 07/15/20 11/23/20  Lewayne Bunting, MD  aspirin 81 MG tablet Take 81 mg by mouth every evening. Reported on 08/10/2015    [provider]  Cholecalciferol 4000 UNITS CAPS Take 4,000 Units by mouth every evening. Reported on 08/10/2015    [provider]  docusate sodium (COLACE) 100 MG capsule Take 100 mg by mouth daily.    [provider]   losartan (COZAAR) 50 MG tablet Take 1.5 tablets (75 mg total) by mouth daily. 11/23/20   Lewayne Bunting, MD  metoprolol tartrate (LOPRESSOR) 50 MG tablet Take 1/2 tablet in the morning and 1 tablet in the evening 11/23/20   Lewayne Bunting, MD  nitroGLYCERIN (NITROSTAT) 0.4 MG SL tablet DISSOLVE 1 TAB UNDER TONGUE FOR CHEST PAIN - IF PAIN REMAINS AFTER 5 MIN, CALL 911 AND REPEAT DOSE. MAX 3 TABS IN 15 MINUTES  12/28/18   Lewayne Bunting, MD  thyroid (ARMOUR) 15 MG tablet Take 1 tablet by mouth daily. 30 mg seven days a week 02/12/20   [provider]    Critical care time: 30 minutes   Tim Lair, PA-C [facility] 04/01/2021 6:50 AM  Please see [email or web address] for pager details.  From 7A-7P if no response, please call [phone] After hours, please call ELink [phone]

## 2021-04-28 NOTE — Progress Notes (Signed)
RT NOTE:  Pt transported to 4N17 without event. Report given to [facility], RRT.

## 2021-04-28 NOTE — H&P (Signed)
Nicole Watkins is an 81 y.o. female.   Chief Complaint: aneurysm rupture, subarachnoid hemorrhage, intracerebral hemorrhage HPI: who was in her usual state of health until this evening when her daughter noticed her slumping in a chair, become aphasic, developed a left facial droop, and weakness on the left side. Intubated emergently at [facility] ED.Transported via EMS to [facility] ED, initial head CT revealed large ICH, dense subarachnoid hemorrhage and minimal shift with no ventriculomegaly. Episode of emesis in the [facility] ED. She was following commands upon arrival to the [facility] ED. Upon my arrival the right pupil was fixed and dilated. The CT angio revealed suspected active extravasation from the aneurysm. Repeat head CT showed the ICH had grown significantly in size and was responsible for the change in her exam.  Past Medical History:  Diagnosis Date   Anxiety    Arthritis    CAD (coronary artery disease)    CHB (complete heart block) (HCC)    a. s/p PPM on 09/29/15   GERD (gastroesophageal reflux disease)    Hyperlipidemia    Hypertension    LBBB (left bundle branch block)    Status post placement of cardiac pacemaker    a. 09/2015: Medtronic Advisa MRI model A2DR01 (serial number [id number]) pacemaker   Thyroid-related proptosis     Past Surgical History:  Procedure Laterality Date   CARDIAC SURGERY     stent placed 03/04/13   EP IMPLANTABLE DEVICE N/A 06/17/2015   Procedure: Loop Recorder Insertion;  Surgeon: [name], MD;  Location: [facility] INVASIVE CV LAB;  Service: Cardiovascular;  Laterality: N/A;   EP IMPLANTABLE DEVICE N/A 09/29/2015   Procedure: Pacemaker Implant;  Surgeon: [name], MD;  Location: [facility] INVASIVE CV LAB;  Service: Cardiovascular;  Laterality: N/A;   No family history      Family History  Problem Relation Age of Onset   Arthritis Mother    Hyperlipidemia Mother    Hypertension Mother    Hypothyroidism Mother    Uterine cancer Maternal Grandmother    Social History:   reports that she has quit smoking. She has never used smokeless tobacco. She reports that she does not drink alcohol and does not use drugs.  Allergies:  Allergies  Allergen Reactions   Lipitor [Atorvastatin] Other (See Comments)    HA, depression, vomiting, headache   Penicillins Anaphylaxis and Swelling    Has patient had a PCN reaction causing immediate rash, facial/tongue/throat swelling, SOB or lightheadedness with hypotension: Yes Has patient had a PCN reaction causing severe rash involving mucus membranes or skin necrosis: No Has patient had a PCN reaction that required hospitalization No Has patient had a PCN reaction occurring within the last 10 years: No If all of the above answers are "NO", then may proceed with Cephalosporin use.    Statins Other (See Comments)    UTI   Ambien [Zolpidem Tartrate] Other (See Comments)    Per patient's daughter-patient becomes delirious   Crestor [Rosuvastatin Calcium] Nausea And Vomiting    Depression, heading   Lisinopril Cough   Pravastatin Nausea Only    NAUSEA, VAGINAL IRRITATION, MOOD CHANGES    (Not in a hospital admission)   Results for orders placed or performed during the hospital encounter of 04/14/2021 (from the past 48 hour(s))  CBG monitoring, ED     Status: Abnormal   Collection Time: 04/04/2021  8:45 PM  Result Value Ref Range   Glucose-Capillary 188 (H) 70 - 99 mg/dL    Comment: Glucose reference range  applies only to samples taken after fasting for at least 8 hours.  Protime-INR     Status: None   Collection Time: 04/14/2021  8:47 PM  Result Value Ref Range   Prothrombin Time 13.9 11.4 - 15.2 seconds   INR 1.1 0.8 - 1.2    Comment: (NOTE) INR goal varies based on device and disease states. Performed at [facility], [address], [place], [place] [address]   APTT     Status: None   Collection Time: 04/18/2021  8:47 PM  Result Value Ref Range   aPTT 29 24 - 36 seconds    Comment: Performed at [facility], [address] [place], [place] [address]  CBC     Status: Abnormal   Collection Time: 04/26/2021  8:47 PM  Result Value Ref Range   WBC 11.2 (H) 4.0 - 10.5 K/uL   RBC 4.70 3.87 - 5.11 MIL/uL   Hemoglobin 13.7 12.0 - 15.0 g/dL   HCT 09.8 11.9 - 14.7 %   MCV 93.0 80.0 - 100.0 fL   MCH 29.1 26.0 - 34.0 pg   MCHC 31.4 30.0 - 36.0 g/dL   RDW 82.9 56.2 - 13.0 %   Platelets 320 150 - 400 K/uL   nRBC 0.0 0.0 - 0.2 %    Comment: Performed at [facility], [address] [place], [place] [address]  Differential     Status: Abnormal   Collection Time: 03/31/2021  8:47 PM  Result Value Ref Range   Neutrophils Relative % 55 %   Neutro Abs 6.3 1.7 - 7.7 K/uL   Lymphocytes Relative 28 %   Lymphs Abs 3.1 0.7 - 4.0 K/uL   Monocytes Relative 11 %   Monocytes Absolute 1.2 (H) 0.1 - 1.0 K/uL   Eosinophils Relative 2 %   Eosinophils Absolute 0.2 0.0 - 0.5 K/uL   Basophils Relative 1 %   Basophils Absolute 0.1 0.0 - 0.1 K/uL   Immature Granulocytes 3 %   Abs Immature Granulocytes 0.33 (H) 0.00 - 0.07 K/uL    Comment: Performed at [facility], [address], [place], [place] [address]  Comprehensive metabolic panel     Status: Abnormal   Collection Time: 04/17/2021  8:47 PM  Result Value Ref Range   Sodium 139 135 - 145 mmol/L   Potassium 3.1 (L) 3.5 - 5.1 mmol/L   Chloride 104 98 - 111 mmol/L   CO2 24 22 - 32 mmol/L   Glucose, Bld 176 (H) 70 - 99 mg/dL    Comment: Glucose reference range applies only to samples taken after fasting for at least 8 hours.   BUN 18 8 - 23 mg/dL   Creatinine, Ser 9.52 0.44 - 1.00 mg/dL   Calcium 9.7 8.9 - 84.1 mg/dL   Total Protein 7.5 6.5 - 8.1 g/dL   Albumin 4.0 3.5 - 5.0 g/dL   AST 30 15 - 41 U/L   ALT 20 0 - 44 U/L   Alkaline Phosphatase 57 38 - 126 U/L   Total Bilirubin 0.7 0.3 - 1.2 mg/dL   GFR, Estimated >32 >44 mL/min    Comment: (NOTE) Calculated using the CKD-EPI Creatinine Equation (2021)    Anion gap 11 5 - 15    Comment:  Performed at [facility], [address], [place], [place] [address]  I-stat chem 8, ED     Status: Abnormal   Collection Time: 04/05/2021  8:52  PM  Result Value Ref Range   Sodium 140 135 - 145 mmol/L   Potassium 3.1 (L) 3.5 - 5.1 mmol/L   Chloride 105 98 - 111 mmol/L   BUN 19 8 - 23 mg/dL   Creatinine, Ser 4.09 0.44 - 1.00 mg/dL   Glucose, Bld 811 (H) 70 - 99 mg/dL    Comment: Glucose reference range applies only to samples taken after fasting for at least 8 hours.   Calcium, Ion 1.12 (L) 1.15 - 1.40 mmol/L   TCO2 27 22 - 32 mmol/L   Hemoglobin 14.6 12.0 - 15.0 g/dL   HCT 91.4 78.2 - 95.6 %  I-Stat arterial blood gas, ED     Status: Abnormal   Collection Time: 04/06/2021 10:12 PM  Result Value Ref Range   pH, Arterial 7.253 (L) 7.350 - 7.450   pCO2 arterial 74.3 (HH) 32.0 - 48.0 mmHg   pO2, Arterial 101 83.0 - 108.0 mmHg   Bicarbonate 32.9 (H) 20.0 - 28.0 mmol/L   TCO2 35 (H) 22 - 32 mmol/L   O2 Saturation 96.0 %   Acid-Base Excess 3.0 (H) 0.0 - 2.0 mmol/L   Sodium 140 135 - 145 mmol/L   Potassium 2.7 (LL) 3.5 - 5.1 mmol/L   Calcium, Ion 1.30 1.15 - 1.40 mmol/L   HCT 40.0 36.0 - 46.0 %   Hemoglobin 13.6 12.0 - 15.0 g/dL   Patient temperature 21.3 F    Collection site RADIAL, ALLEN'S TEST ACCEPTABLE    Drawn by Operator    Sample type ARTERIAL    Comment NOTIFIED PHYSICIAN   Resp Panel by RT-PCR (Flu A&B, Covid) Nasopharyngeal Swab     Status: None   Collection Time: 04/06/2021 10:37 PM   Specimen: Nasopharyngeal Swab; Nasopharyngeal(NP) swabs in vial transport medium  Result Value Ref Range   SARS Coronavirus 2 by RT PCR NEGATIVE NEGATIVE    Comment: (NOTE) SARS-CoV-2 target nucleic acids are NOT DETECTED.  The SARS-CoV-2 RNA is generally detectable in upper respiratory specimens during the acute phase of infection. The lowest concentration of SARS-CoV-2 viral copies this assay can detect is 138 copies/mL. A negative result does not preclude SARS-Cov-2 infection and  should not be used as the sole basis for treatment or other patient management decisions. A negative result may occur with  improper specimen collection/handling, submission of specimen other than nasopharyngeal swab, presence of viral mutation(s) within the areas targeted by this assay, and inadequate number of viral copies(<138 copies/mL). A negative result must be combined with clinical observations, patient history, and epidemiological information. The expected result is Negative.  Fact Sheet for Patients:  BloggerCourse.com  Fact Sheet for Healthcare Providers:  SeriousBroker.it  This test is no t yet approved or cleared by the [place] FDA and  has been authorized for detection and/or diagnosis of SARS-CoV-2 by FDA under an Emergency Use Authorization (EUA). This EUA will remain  in effect (meaning this test can be used) for the duration of the COVID-19 declaration under Section 564(b)(1) of the Act, 21 U.S.C.section 360bbb-3(b)(1), unless the authorization is terminated  or revoked sooner.       Influenza A by PCR NEGATIVE NEGATIVE   Influenza B by PCR NEGATIVE NEGATIVE    Comment: (NOTE) The Xpert Xpress SARS-CoV-2/FLU/RSV plus assay is intended as an aid in the diagnosis of influenza from Nasopharyngeal swab specimens and should not be used as a sole basis for treatment. Nasal washings and aspirates are unacceptable for Xpert Xpress SARS-CoV-2/FLU/RSV testing.  Fact  Sheet for Patients: BloggerCourse.comhttps://www.fda.gov/media/152166/download  Fact Sheet for Healthcare Providers: SeriousBroker.ithttps://www.fda.gov/media/152162/download  This test is not yet approved or cleared by the [place] FDA and has been authorized for detection and/or diagnosis of SARS-CoV-2 by FDA under an Emergency Use Authorization (EUA). This EUA will remain in effect (meaning this test can be used) for the duration of the COVID-19 declaration under Section  564(b)(1) of the Act, 21 U.S.C. section 360bbb-3(b)(1), unless the authorization is terminated or revoked.  Performed at [facility], [address] [place], [place] [id number]    DG Chest Portable 1 View  Result Date: 04/23/2021 CLINICAL DATA:  Respiratory failure EXAM: PORTABLE CHEST 1 VIEW COMPARISON:  10/13/2015 FINDINGS: Endotracheal tube seen 15 mm above the carina. Nasogastric tube extends into the upper abdomen beyond the margin of the examination. Pulmonary insufflation is normal and symmetric. Lungs are clear. No pneumothorax or pleural effusion. Cardiac size within normal limits. Left subclavian dual lead pacemaker is unchanged. Pulmonary vascularity is normal. No acute bone abnormality. IMPRESSION: Support tubes in appropriate position. Lungs are clear. Electronically Signed   By: Helyn NumbersAshesh  Parikh M.D.   On: 04/10/2021 21:51   CT HEAD CODE STROKE WO CONTRAST  Result Date: 04/16/2021 CLINICAL DATA:  Code stroke. Initial evaluation for neuro deficit, stroke. EXAM: CT HEAD WITHOUT CONTRAST TECHNIQUE: Contiguous axial images were obtained from the base of the skull through the vertex without intravenous contrast. RADIATION DOSE REDUCTION: This exam was performed according to the departmental dose-optimization program which includes automated exposure control, adjustment of the mA and/or kV according to patient size and/or use of iterative reconstruction technique. COMPARISON:  Head CT from 06/16/2015. FINDINGS: Brain: Age-related cerebral atrophy with chronic microvascular ischemic disease. Acute intraparenchymal hemorrhage centered at the posterior right frontotemporal region measures 4.3 x 3.1 x 3.4 cm (estimated volume 23 mL). Minimal surrounding vasogenic edema. Fairly large volume subarachnoid hemorrhage seen throughout the basilar cisterns with extension along the falx. Trace 3 mm right-to-left shift. No hydrocephalus or trapping. No other acute large vessel territory infarct. No  visible mass lesion. No extra-axial fluid collection. Vascular: Obscured by subarachnoid hemorrhage. Skull: Scalp soft tissues and calvarium within normal limits. Sinuses/Orbits: Unremarkable Other: None. ASPECTS [facility]([place] Stroke Program Early CT Score) Acute intracranial hemorrhage, does not apply. IMPRESSION: 4.3 x 3.1 x 3.4 cm (estimated volume 23 mL) acute intraparenchymal hemorrhage centered at the posterior right frontotemporal region. Associated large volume subarachnoid hemorrhage throughout the basilar cisterns with extension along the falx. Trace 3 mm right-to-left shift. No hydrocephalus or trapping. These results were communicated to Dr. Otelia LimesLindzen at 9:19 pm on 04/09/2021 by text page via the [facility] messaging system. Electronically Signed   By: Rise MuBenjamin  McClintock M.D.   On: 04/25/2021 21:19   CT ANGIO HEAD CODE STROKE  Result Date: 04/06/2021 CLINICAL DATA:  Initial evaluation for acute intracranial hemorrhage. EXAM: CT ANGIOGRAPHY HEAD TECHNIQUE: Multidetector CT imaging of the head was performed using the standard protocol during bolus administration of intravenous contrast. Multiplanar CT image reconstructions and MIPs were obtained to evaluate the vascular anatomy. RADIATION DOSE REDUCTION: This exam was performed according to the departmental dose-optimization program which includes automated exposure control, adjustment of the mA and/or kV according to patient size and/or use of iterative reconstruction technique. CONTRAST:  60mL OMNIPAQUE IOHEXOL 350 MG/ML SOLN COMPARISON:  Prior noncontrast head CT from earlier the same day. FINDINGS: CTA HEAD Anterior circulation: Atheromatous change about the partially visualized carotid bifurcations without high-grade stenosis. Cervical ICAs tortuous but otherwise patent within  the neck. Petrous segments patent bilaterally. Moderate atheromatous change throughout the carotid siphons without high-grade stenosis. A1 segments patent bilaterally. Left A1  hypoplastic. Normal anterior communicating artery complex. Both ACAs somewhat irregular but patent to their distal aspects without stenosis. No M1 stenosis or occlusion. Normal MCA bifurcations. Distal left MCA branches well perfused. Regional mass effect on the right MCA branches due to the intraparenchymal hemorrhage at the right frontotemporal region. The right MCA branches remain patent. There is a saccular outpouching arising from a distal right MCA branch measuring 8 x 7 x 9 mm (series 9, image 73). This is positioned at the superior margin of the hematoma, and is suspicious for a distal aneurysm. Additional 6 mm rounded hyperdensity seen just medially and posteriorly could reflect a second aneurysm component and/or active contrast extravasation (series 7, image 60). Additional blush of contrast positioned just medially and inferiorly consistent with active contrast extravasation/active bleeding (series 7, image 58). Findings represent the source of acute hemorrhage. Posterior circulation: Visualized vertebral arteries tortuous within the neck. Atheromatous irregularity within the V4 segments without high-grade stenosis. Both PICA appear patent. Extensive atheromatous irregularity throughout the basilar artery with associated mild to moderate multifocal narrowing. Superior cerebellar arteries patent bilaterally. Both PCAs primarily supplied via the basilar. 3 mm outpouching at the left P1/P2 junction consistent with a small aneurysm (series 9, image 122). PCAs irregular but remain well perfused and patent to their distal aspects. Venous sinuses: Not well assessed due to timing of the contrast bolus. Anatomic variants: None significant. Review of the MIP images confirms the above findings. IMPRESSION: 1. 8 x 7 x 9 mm saccular outpouching arising from a distal right MCA branch, consistent with aneurysm. Evidence of adjacent active contrast extravasation/bleeding. The peripheral location is atypical/unusual, but  can sometimes be seen in the setting of mycotic aneurysm. Correlation with history and any potential symptomatology recommended. 2. 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 3. Moderate to advanced atheromatous change about the intracranial arterial vasculature, most pronounced at the basilar artery. No other high-grade stenosis. Case discussed by telephone at the time of interpretation on 04/11/2021 at 10:00 pm to provider ERIC [facility] , who verbally acknowledged these results. Electronically Signed   By: Rise Mu M.D.   On: 04/09/2021 23:00    Review of Systems  Unable to perform ROS: Intubated   Blood pressure 119/63, pulse 66, temperature (!) 95.1 F (35.1 C), resp. rate 20, weight 70.4 kg, SpO2 100 %. Physical Exam HENT:     Head: Normocephalic and atraumatic.     Right Ear: External ear normal.     Left Ear: External ear normal.     Nose: Nose normal.     Mouth/Throat:     Pharynx: Oropharynx is clear.  Pulmonary:     Comments: ventilated Abdominal:     General: Abdomen is flat.  Skin:    General: Skin is warm and dry.  Neurological:     Mental Status: She is unresponsive.     Cranial Nerves: Cranial nerve deficit present.     Comments: Right pupil fixed and dilated, 16mm No corneal reflex   Psychiatric:     Comments: comatose     Assessment/Plan Nicole Watkins is a 81 y.o. 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 Hunt Hess grade 5, fisher grade 4 Initial CT angiogram strongly suggested contrast extravasation, follow up CT confirmed expanding hematoma. I have advised Ms. Strack's daughter that  any treatment would be futile. Unfortunately [name] will not survive this cerebral insult to the best of my medical knowledge. No interventions will be undertaken. A grandson is in the air at this time, and Ms. Kozicki's daughter would like to attempt to keep her alive until his  arrival. She will be transported to the ICU intubated.   Coletta MemosKyle Harol Shabazz, MD 04/13/2021, 12:10 AM

## 2021-04-28 NOTE — Progress Notes (Signed)
°   Apr 19, 2021 0230  Clinical Encounter Type  Visited With Family;Patient  Visit Type Spiritual support;Patient actively dying  Referral From Nurse  Consult/Referral To Chaplain  Spiritual Encounters  Spiritual Needs Prayer;Emotional;Grief support   Chaplain responded to call from nurse to be with family and provided prayer, emotional, and grief support. Patient passed at 5:38am.

## 2021-04-28 DEATH — deceased

## 2021-05-10 ENCOUNTER — Encounter: Payer: Medicare Other | Admitting: Internal Medicine

## 2021-06-21 ENCOUNTER — Encounter: Payer: Medicare Other | Admitting: Cardiology
